# Patient Record
Sex: Female | Born: 1960 | Race: White | Hispanic: No | Marital: Married | Smoking: Never smoker
Health system: Southern US, Community
[De-identification: ages and names within clinical notes are randomized; demographics above are authoritative.]

## PROBLEM LIST (undated history)

## (undated) DIAGNOSIS — I442 Atrioventricular block, complete: Secondary | ICD-10-CM

## (undated) DIAGNOSIS — Z889 Allergy status to unspecified drugs, medicaments and biological substances status: Secondary | ICD-10-CM

## (undated) DIAGNOSIS — F29 Unspecified psychosis not due to a substance or known physiological condition: Secondary | ICD-10-CM

## (undated) DIAGNOSIS — F419 Anxiety disorder, unspecified: Secondary | ICD-10-CM

## (undated) HISTORY — DX: Allergy status to unspecified drugs, medicaments and biological substances: Z88.9

## (undated) HISTORY — DX: Atrioventricular block, complete: I44.2

## (undated) HISTORY — DX: Anxiety disorder, unspecified: F41.9

## (undated) HISTORY — DX: Unspecified psychosis not due to a substance or known physiological condition: F29

## (undated) HISTORY — PX: TUBAL LIGATION: SHX77

---

## 2010-12-20 ENCOUNTER — Ambulatory Visit (INDEPENDENT_AMBULATORY_CARE_PROVIDER_SITE_OTHER): Payer: BC Managed Care – PPO | Admitting: Psychiatry

## 2010-12-20 ENCOUNTER — Ambulatory Visit (HOSPITAL_COMMUNITY): Payer: Self-pay | Admitting: Psychiatry

## 2010-12-20 DIAGNOSIS — F3289 Other specified depressive episodes: Secondary | ICD-10-CM

## 2011-01-07 ENCOUNTER — Encounter (HOSPITAL_COMMUNITY): Payer: Self-pay | Admitting: Psychiatry

## 2011-01-10 ENCOUNTER — Ambulatory Visit (HOSPITAL_COMMUNITY): Payer: BC Managed Care – PPO | Admitting: Psychiatry

## 2011-01-10 ENCOUNTER — Ambulatory Visit (INDEPENDENT_AMBULATORY_CARE_PROVIDER_SITE_OTHER): Payer: BC Managed Care – PPO | Admitting: Psychiatry

## 2011-01-10 ENCOUNTER — Encounter (HOSPITAL_COMMUNITY): Payer: Self-pay | Admitting: Psychiatry

## 2011-01-10 VITALS — Ht 64.0 in | Wt 223.0 lb

## 2011-01-10 DIAGNOSIS — F29 Unspecified psychosis not due to a substance or known physiological condition: Secondary | ICD-10-CM

## 2011-01-10 MED ORDER — ARIPIPRAZOLE 2 MG PO TABS: 2.0000 mg | ORAL_TABLET | Freq: Every day | ORAL | Status: DC

## 2011-01-10 NOTE — Progress Notes (Signed)
   [facility] Follow-up Outpatient Visit  Alejandra Harrison [birth year]  Date:    Subjective: Returns with husband.  Still obsessed with videos and some irrational thinking.  Confusing talk about not remembering what she has done.   Some visual and auditory hallucinations (God told me I was a good girl).  Ht.64in  Wt.223lb  Mental Status Examination  Appearance: brighter, more appropriate, smiling, appropriate makeup Alert: Yes Attention: good  Cooperative: Yes Eye Contact: Good Speech: normal Psychomotor Activity: Normal Memory/Concentration: grossly intact Oriented: person, place, time/date and situation Mood: Euthymic Affect: Congruent Thought Processes and Associations: Linear Fund of Knowledge: Fair Thought Content: Seems more appropriate, but still not always making good sense to husband Insight: Fair Judgement: Fair  Diagnosis: Psychosis, etiology unclear, likely bipolar  Treatment Plan: Abilify increased to 2 mg/day To see therapist -- need to schedule next visit  Rossie Muskrat, MD

## 2011-01-31 ENCOUNTER — Encounter (HOSPITAL_COMMUNITY): Payer: Self-pay | Admitting: Psychiatry

## 2011-01-31 ENCOUNTER — Ambulatory Visit (INDEPENDENT_AMBULATORY_CARE_PROVIDER_SITE_OTHER): Payer: BC Managed Care – PPO | Admitting: Psychiatry

## 2011-01-31 DIAGNOSIS — F29 Unspecified psychosis not due to a substance or known physiological condition: Secondary | ICD-10-CM

## 2011-01-31 MED ORDER — ARIPIPRAZOLE 5 MG PO TABS: 5.0000 mg | ORAL_TABLET | Freq: Every day | ORAL | Status: DC

## 2011-01-31 NOTE — Progress Notes (Signed)
Called pharmacy

## 2011-01-31 NOTE — Progress Notes (Signed)
Patient ID: Alejandra Harrison, female   DOB: 03-29-1960, 49 y.o.   MRN: [id number] Patient with husband.  She still is obsessed with videos and isolation from family.  Instructed them to increase her Abilify to 5 mg. And observe response.

## 2011-02-24 ENCOUNTER — Telehealth (HOSPITAL_COMMUNITY): Payer: Self-pay | Admitting: *Deleted

## 2011-03-14 ENCOUNTER — Ambulatory Visit (HOSPITAL_COMMUNITY): Payer: BC Managed Care – PPO | Admitting: Psychiatry

## 2011-03-21 ENCOUNTER — Encounter (HOSPITAL_COMMUNITY): Payer: Self-pay | Admitting: Psychiatry

## 2011-03-21 ENCOUNTER — Ambulatory Visit (INDEPENDENT_AMBULATORY_CARE_PROVIDER_SITE_OTHER): Payer: BC Managed Care – PPO | Admitting: Psychiatry

## 2011-03-21 DIAGNOSIS — F29 Unspecified psychosis not due to a substance or known physiological condition: Secondary | ICD-10-CM

## 2011-03-21 MED ORDER — ARIPIPRAZOLE 5 MG PO TABS: 5.0000 mg | ORAL_TABLET | Freq: Every day | ORAL | Status: DC

## 2011-03-21 NOTE — Progress Notes (Signed)
Patient ID: Alejandra Harrison, female   DOB: [birth year], 51 y.o.   MRN: [id number] Patient reports less symptoms; not watching videos as much and fewer intrusive thoughts.  Less psychotic thinking.  Mood more stable lately.

## 2011-03-21 NOTE — Patient Instructions (Signed)
Continue medication as directed.

## 2011-05-09 ENCOUNTER — Ambulatory Visit (INDEPENDENT_AMBULATORY_CARE_PROVIDER_SITE_OTHER): Payer: BC Managed Care – PPO | Admitting: Psychiatry

## 2011-05-09 ENCOUNTER — Encounter (HOSPITAL_COMMUNITY): Payer: Self-pay | Admitting: Psychiatry

## 2011-05-09 DIAGNOSIS — F29 Unspecified psychosis not due to a substance or known physiological condition: Secondary | ICD-10-CM

## 2011-05-09 NOTE — Progress Notes (Signed)
Patient ID: Alejandra Harrison, female   DOB: December 06, 1960, 51 y.o.   MRN: [id number] No problems.  Thinking is mostly clear.  Mood stable and not fluctuating (not too high or too low).  Sleep is adequate.  Husband not complaining.  Eating well.

## 2011-08-29 ENCOUNTER — Ambulatory Visit (INDEPENDENT_AMBULATORY_CARE_PROVIDER_SITE_OTHER): Payer: BC Managed Care – PPO | Admitting: Psychiatry

## 2011-08-29 ENCOUNTER — Encounter (HOSPITAL_COMMUNITY): Payer: Self-pay | Admitting: Psychiatry

## 2011-08-29 DIAGNOSIS — F29 Unspecified psychosis not due to a substance or known physiological condition: Secondary | ICD-10-CM

## 2011-08-29 MED ORDER — ARIPIPRAZOLE 2 MG PO TABS: 2.0000 mg | ORAL_TABLET | Freq: Every day | ORAL | Status: DC

## 2011-08-29 NOTE — Progress Notes (Signed)
Patient ID: Alejandra Harrison, female   DOB: [birth year], 51 y.o.   MRN: [id number] Lost weight and had a tremor so decreased med by half.  Sleeping better on 2.5 Abilify.  But only ~4hr. Could not eat well leading to wt. Loss of 41 lb.  MD said she was ok.  Appetite better.  Feel unmotivated to do anything now.  Not feeling sad or unhappy.  Husband agrees with her worries.  Has akathesia with restless feet.  No paranoid thinking.

## 2011-09-26 ENCOUNTER — Encounter (HOSPITAL_COMMUNITY): Payer: Self-pay | Admitting: Psychiatry

## 2011-09-26 ENCOUNTER — Ambulatory Visit (INDEPENDENT_AMBULATORY_CARE_PROVIDER_SITE_OTHER): Payer: BC Managed Care – PPO | Admitting: Psychiatry

## 2011-09-26 DIAGNOSIS — F29 Unspecified psychosis not due to a substance or known physiological condition: Secondary | ICD-10-CM

## 2011-09-26 NOTE — Progress Notes (Signed)
Patient ID: Alejandra Harrison, female   DOB: 05-18-1960, 51 y.o.   MRN: [id number] Seems better with decrease Abilify to 1mg /day.  Tremor decreased.  Thinking is ok.  Husband not worried now.  No obsessive thoughts or worries.  Sleep ok.

## 2011-12-19 ENCOUNTER — Ambulatory Visit (INDEPENDENT_AMBULATORY_CARE_PROVIDER_SITE_OTHER): Payer: BC Managed Care – PPO | Admitting: Psychiatry

## 2011-12-19 ENCOUNTER — Encounter (HOSPITAL_COMMUNITY): Payer: Self-pay | Admitting: Psychiatry

## 2011-12-19 DIAGNOSIS — F29 Unspecified psychosis not due to a substance or known physiological condition: Secondary | ICD-10-CM

## 2011-12-19 MED ORDER — ARIPIPRAZOLE 2 MG PO TABS: 2.0000 mg | ORAL_TABLET | Freq: Every day | ORAL | Status: DC

## 2011-12-19 NOTE — Progress Notes (Deleted)
[facility] MD Progress Note  Diagnosis:  {Axis Diagnosis:3049000}{OT ALL NOTES:[id number]}298.90 Psychosis  ADL's:  {BHH ADL'S:22290}xxxxx  Sleep: {[facility] GOOD/FAIR/POOR:22877}good    Appetite:  {B[facility] GOOD/FAIR/POOR:22877}good  Suicidal Ideation:  {[facility] IDEATION:22878}noneHomicidal Ideation: none {BHH IDEATION:22878}  AEB (as evidenced by):  Mental Status Examination/Evaluation: Objective:  Appearance: {Appearance:22683}appropriate  Eye Contact::  {[facility] EYE CONTACT:22684}tgood  Speech:  {Speech:22685}appropriate  Volume:  {Volume (PAA):22686}  Mood:  {BHH MOOD:22306}stable  Affect:  {Affect (PAA):22687}neutral  Thought Process:  {Thought Process (PAA):22688}intact  Orientation:  {BHH ORIENTATION (PAA):22689}in all spheres  Thought Content:  {Thought Content:22690}approp  Suicidal Thoughts:  {ST/HT (PAA):22692}none  Homicidal Thoughts:  {ST/HT (PAA):22692}none  Memory:  {B[facility] MEMORY:22881}intact  Judgement:  {Judgement (PAA):22694}good  Insight:  {Insight (PAA):22695}fair  Psychomotor Activity:  {Psychomotor (PAA):22696}approp  Concentration:  {BHH GOOD/FAIR/POOR:22877}good  Recall:  {BHH GOOD/FAIR/POOR:22877}good  Akathisia:  {BHH YES OR NO:22294}no  Handed:  {Handed:22697}right  AIMS (if indicated):     Assets:  {Assets (PAA):22698}intact family  Sleep:   good   Current Medications:   Physical Findings: AIMS:  , ,  ,  ,   none CIWA:   none  COWS:   none  Treatment Plan Summary: {[facility] MD TX. PLAN:22744}Patient functioning better with 1mg . Daily Abilify.  Not obsessing and feels well.  Health ok.  Sleeping ok.  Drives bus early.    Plan:continue med above

## 2011-12-19 NOTE — Progress Notes (Signed)
Patient ID: Alejandra Harrison, female   DOB: 04-21-1960, 51 y.o.   MRN: [id number] Patient reports doing well on 1 mg. Daily Abilify.  No anxiety or obsessions.  Sleep ok.  Functioning well in work and at home.  No delusional thinking.

## 2012-04-02 ENCOUNTER — Ambulatory Visit (INDEPENDENT_AMBULATORY_CARE_PROVIDER_SITE_OTHER): Payer: BC Managed Care – PPO | Admitting: Psychiatry

## 2012-04-02 ENCOUNTER — Encounter (HOSPITAL_COMMUNITY): Payer: Self-pay | Admitting: Psychiatry

## 2012-04-02 VITALS — Ht 65.0 in | Wt 213.4 lb

## 2012-04-02 DIAGNOSIS — F29 Unspecified psychosis not due to a substance or known physiological condition: Secondary | ICD-10-CM

## 2012-04-02 DIAGNOSIS — F5105 Insomnia due to other mental disorder: Secondary | ICD-10-CM | POA: Insufficient documentation

## 2012-04-02 MED ORDER — ARIPIPRAZOLE 2 MG PO TABS: 1.0000 mg | ORAL_TABLET | Freq: Every day | ORAL | Status: DC

## 2012-04-02 NOTE — Progress Notes (Signed)
[facility] [address] Progress Note MACARENA LANGSETH MRN: [id number] DOB: 04/11/1960 Age: 52 y.o.  Date: 04/02/2012 Start Time: 10:30 AM End Time: 10:50 AM  Chief Complaint: Chief Complaint  Patient presents with  . Follow-up  . Medication Refill   Subjective:   "I'm doing pretty well". Depression 0/10 and Anxiety 0/10, where 1 is the best and 10 is the worst.   Pt returns for follow-up.  Pt reports that she is compliant with the psychotropic medications with great benefit and no noticeable side effects.  She has noted one dream and that could be a side effect.  In detail she describes sleep paralysis, but that was one event of that.  Diagnosis:   Axis I: Psychotic Disorder NOS Axis II: Deferred Axis III:  Past Medical History  Diagnosis Date  . Anxiety   . Phlebitis   . History of seasonal allergies   . Psychosis    Axis IV: other psychosocial or environmental problems Axis V: 51-60 moderate symptoms  ADL's:  Intact  Sleep: Good  Appetite:  Good  Suicidal Ideation:  Pt denies any thoughts, plans, intent of suicide Homicidal Ideation:  Pt denies any thoughts, plans, intent of homicide  AEB (as evidenced by):per pt report  Psychiatric Specialty Exam: Review of Systems  Musculoskeletal: Positive for back pain.  All other systems reviewed and are negative.    Height 5\' 5"  (1.651 m), weight 213 lb 6.4 oz (96.798 kg).Body mass index is 35.51 kg/(m^2).  General Appearance: Casual  Eye Contact::  Good  Speech:  Clear and Coherent  Volume:  Normal  Mood:  Euthymic  Affect:  Congruent  Thought Process:  Coherent, Linear and Logical  Orientation:  Full (Time, Place, and Person)  Thought Content:  WDL  Suicidal Thoughts:  No  Homicidal Thoughts:  No  Memory:  Immediate;   Good Recent;   Good Remote;   Good  Judgement:  Good  Insight:  Good  Psychomotor Activity:  Normal  Concentration:  Good  Recall:  Good  Akathisia:  No  Handed:  Right  AIMS (if  indicated):     Assets:  Communication Skills Desire for Improvement  Sleep:      Current Medications: Current Outpatient Prescriptions  Medication Sig Dispense Refill  . ARIPiprazole (ABILIFY) 2 MG tablet Take 1 tablet (2 mg total) by mouth daily.  30 tablet  12  . doxycycline (VIBRA-TABS) 100 MG tablet        Lab Results: No results found for this or any previous visit (from the past 8736 hour(s)). Family doctor did blood work last year and it all came back good.  Physical Findings: AIMS:  , ,  ,  ,    CIWA:    COWS:     Treatment Plan Summary: Medication management  Plan: I took her vitals.  I reviewed CC, tobacco/med/surg Hx, meds effects/ side effects, problem list, therapies and responses as well as current situation/symptoms discussed options. See orders and pt instructions for more details.  Medical Decision Making Problem Points:  Established problem, stable/improving (1), Review of last therapy session (1) and Review of psycho-social stressors (1) Data Points:  Review or order clinical lab tests (1) Review of medication regiment & side effects (2)  I certify that inpatient services furnished can reasonably be expected to improve the patient's condition.   Orson Aloe, MD, [facility]

## 2012-04-02 NOTE — Patient Instructions (Signed)
Paraphrenia of late life could make some sense of what is going on with you.  Look that up on the Internet.  Yoga is a very helpful exercise method.  On TV or on line [employer]ai[employer] is a source of high quality information about yoga and videos on yoga.  Alejandra Harrison is the world's number one video yoga instructor according to some experts.  There are exceptional health benefits that can be achieved through yoga.  The main principles of yoga is acceptance, no competition, no comparison, and no judgement.  It is exceptional in helping people meditate and get to a very relaxed state.   Call if problems or concerns.

## 2012-04-09 ENCOUNTER — Ambulatory Visit (HOSPITAL_COMMUNITY): Payer: Self-pay | Admitting: Psychiatry

## 2012-04-10 ENCOUNTER — Other Ambulatory Visit: Payer: Self-pay

## 2012-06-25 ENCOUNTER — Ambulatory Visit (INDEPENDENT_AMBULATORY_CARE_PROVIDER_SITE_OTHER): Payer: BC Managed Care – PPO | Admitting: Psychiatry

## 2012-06-25 ENCOUNTER — Encounter (HOSPITAL_COMMUNITY): Payer: Self-pay | Admitting: Psychiatry

## 2012-06-25 ENCOUNTER — Ambulatory Visit (HOSPITAL_COMMUNITY): Payer: Self-pay | Admitting: Psychiatry

## 2012-06-25 VITALS — BP 128/92 | HR 69 | Ht 64.5 in | Wt 220.0 lb

## 2012-06-25 DIAGNOSIS — F5105 Insomnia due to other mental disorder: Secondary | ICD-10-CM

## 2012-06-25 DIAGNOSIS — F29 Unspecified psychosis not due to a substance or known physiological condition: Secondary | ICD-10-CM

## 2012-06-25 MED ORDER — ARIPIPRAZOLE 2 MG PO TABS: 1.0000 mg | ORAL_TABLET | Freq: Every day | ORAL | Status: DC

## 2012-06-25 NOTE — Progress Notes (Signed)
[facility] [address] Progress Note Alejandra Harrison MRN: [id number] DOB: 03/25/1960 Age: 52 y.o.  Date: 06/25/2012 Start Time: 10:40 AM End Time: 10:55 AM  Chief Complaint: Chief Complaint  Patient presents with  . Other  . Follow-up  . Medication Refill   Subjective:   "I'm doing pretty well". Depression 1/10 and Anxiety 2/10, where 1 is the best and 10 is the worst. Pain is 0/10.  Pt returns for follow-up.  Pt reports that she is compliant with the psychotropic medications with good benefit and no noticeable side effects.  She had one episode of sleep paralysis the time before last, none this time.  She looked up paraphernia on the Internet and she feels that that describes her symptom picture.  Occasionally she takes a whole tab at night.  Will prescribe to cover that.  Allergies: Allergies  Allergen Reactions  . Oxycodone-Acetaminophen   . Penicillins    Medical History: Past Medical History  Diagnosis Date  . Anxiety   . Phlebitis   . History of seasonal allergies   . Psychosis    Surgical History: Past Surgical History  Procedure Laterality Date  . Tubal ligation     Diagnosis:   Axis I: Psychotic Disorder NOS Axis II: Deferred Axis III:  Past Medical History  Diagnosis Date  . Anxiety   . Phlebitis   . History of seasonal allergies   . Psychosis    Axis IV: other psychosocial or environmental problems Axis V: 51-60 moderate symptoms  ADL's:  Intact  Sleep: Good  Appetite:  Good  Suicidal Ideation:  Pt denies any thoughts, plans, intent of suicide Homicidal Ideation:  Pt denies any thoughts, plans, intent of homicide  AEB (as evidenced by):per pt report  Psychiatric Specialty Exam: Review of Systems  Musculoskeletal: Positive for back pain.  All other systems reviewed and are negative.    Blood pressure 128/92, pulse 69, height 5' 4.5" (1.638 m), weight 220 lb (99.791 kg).Body mass index is 37.19 kg/(m^2).  General Appearance: Casual   Eye Contact::  Good  Speech:  Clear and Coherent  Volume:  Normal  Mood:  Euthymic  Affect:  Congruent  Thought Process:  Coherent, Linear and Logical  Orientation:  Full (Time, Place, and Person)  Thought Content:  WDL  Suicidal Thoughts:  No  Homicidal Thoughts:  No  Memory:  Immediate;   Good Recent;   Good Remote;   Good  Judgement:  Good  Insight:  Good  Psychomotor Activity:  Normal  Concentration:  Good  Recall:  Good  Akathisia:  No  Handed:  Right  AIMS (if indicated):     Assets:  Communication Skills Desire for Improvement  Sleep:      Current Medications: Current Outpatient Prescriptions  Medication Sig Dispense Refill  . ARIPiprazole (ABILIFY) 2 MG tablet Take 0.5 tablets (1 mg total) by mouth daily.  15 tablet  2  . doxycycline (VIBRA-TABS) 100 MG tablet        No current facility-administered medications for this visit.   Lab Results: No results found for this or any previous visit (from the past 8736 hour(s)). Family doctor did blood work last year and it all came back good.  Physical Findings: AIMS:  , ,  ,  ,    CIWA:    COWS:     Treatment Plan Summary: Medication management  Plan: I took her vitals.  I reviewed CC, tobacco/med/surg Hx, meds effects/ side effects, problem list, therapies and  responses as well as current situation/symptoms discussed options. Continue current effective medications. See orders and pt instructions for more details.  MEDICATIONS this encounter: Meds ordered this encounter  Medications  . ARIPiprazole (ABILIFY) 2 MG tablet    Sig: Take 0.5-1 tablets (1-2 mg total) by mouth daily.    Dispense:  30 tablet    Refill:  2   Medical Decision Making Problem Points:  Established problem, stable/improving (1), Review of last therapy session (1) and Review of psycho-social stressors (1) Data Points:  Review or order clinical lab tests (1) Review of medication regiment & side effects (2)  I certify that inpatient  services furnished can reasonably be expected to improve the patient's condition.   Orson Aloe, MD, [facility]

## 2012-06-25 NOTE — Patient Instructions (Signed)
Set a timer for 8 or a certain number minutes and walk for that amount of time in the house or in the yard.  Mark the number of minutes on a calendar for that day.  Do that every day this week.  Then next week increase the time by 1 minutes and then mark the calendar with the number of minutes for that day.  Each week increase your exercise by one minute.  Keep a record of this so you can see the progress you are making.  Do this every day, just like eating and sleeping.  It is good for pain control, depression, and for your soul/spirit.  Bring the record in for your next visit so we can talk about your effort and how you feel with the new exercise program going and working for you.  Take care of yourself.  No one else is standing up to do the job and only you know what you need.   GET SERIOUS about taking care of yourself.  Do the next right thing and that often means doing something to care for yourself along the lines of are you hungry, are you angry, are you lonely, are you tired, are you scared?  [employer] is what that stands for.  Call if problems or concerns. 

## 2012-09-23 ENCOUNTER — Encounter (HOSPITAL_COMMUNITY): Payer: Self-pay | Admitting: Psychiatry

## 2012-09-23 ENCOUNTER — Ambulatory Visit (INDEPENDENT_AMBULATORY_CARE_PROVIDER_SITE_OTHER): Payer: BC Managed Care – PPO | Admitting: Psychiatry

## 2012-09-23 VITALS — Wt 223.0 lb

## 2012-09-23 DIAGNOSIS — F5105 Insomnia due to other mental disorder: Secondary | ICD-10-CM

## 2012-09-23 DIAGNOSIS — F29 Unspecified psychosis not due to a substance or known physiological condition: Secondary | ICD-10-CM

## 2012-09-23 MED ORDER — ARIPIPRAZOLE 2 MG PO TABS: ORAL_TABLET | ORAL | Status: DC

## 2012-09-23 NOTE — Progress Notes (Signed)
[facility] [address] Progress Note Alejandra Harrison MRN: [id number] DOB: Aug 06, 1960 Age: 52 y.o.  Date: 09/23/2012  Chief Complaint  Patient presents with  . Follow-up  . Medication Refill   History of present illness. Sugars 52 year old Caucasian married female who came for her followup appointment.  [name] taking Abilify 1 mg at bedtime.  [name] very scared to take more than 1 mg.  She feels much better with low dose Abilify.  Since taking Abilify she has not seen any unusual things.  However she is very scared to stop her psychotropic medication.  She sleeping better.  She denies any irritability anger or mood swings.  She denies any tremors or shakes.  She wants to continue low dose Abilify.  [name] not drinking or using any illegal substance.  Past psychiatric history. Patient started to have visual hallucination in 2012.  She was seen by Dr. Toni Arthurs and given Abilify.  Since then she's been taking the same medication without any side effects.  She denies any history of suicidal attempt or any inpatient psychiatric treatment.  Allergies: Allergies  Allergen Reactions  . Oxycodone-Acetaminophen   . Penicillins    Medical History: Past Medical History  Diagnosis Date  . Anxiety   . Phlebitis   . History of seasonal allergies   . Psychosis    Surgical History: Past Surgical History  Procedure Laterality Date  . Tubal ligation       Review of Systems  Constitutional: Positive for weight loss.  Musculoskeletal: Positive for back pain.  Psychiatric/Behavioral: Positive for depression. The patient is nervous/anxious and has insomnia.   All other systems reviewed and are negative.       Current Medications: Current Outpatient Prescriptions  Medication Sig Dispense Refill  . ARIPiprazole (ABILIFY) 2 MG tablet Take 1/2 tab at bed time  15 tablet  1   No current facility-administered medications for this visit.   Lab Results: No results found for this or any previous  visit (from the past 8736 hour(s)). Family doctor did blood work last year and it all came back good.  Mental status examination Patient is obese female who is casually dressed and fairly groomed.  She appears to be his stated age.  Her speech is clear and coherent.  Her thought process is slow but logical.  She denies any active or passive suicidal thoughts homicidal thought.  At this time she denies any visual or auditory hallucination.  She described her mood as anxious and her affect is mood appropriate.  There were no flight if ideas or any loose association.  Her attention concentration is fair.  [name] oriented x3.  There were no tremors or shakes.  Her insight judgment and impulse control is good.  Diagnosis:   Axis I: Psychotic Disorder NOS Axis II: Deferred Axis III:  Past Medical History  Diagnosis Date  . Anxiety   . Phlebitis   . History of seasonal allergies   . Psychosis    Axis IV: other psychosocial or environmental problems Axis V: 51-60 moderate symptoms  Plan: I will continue Abilify 1 mg at bedtime.  Patient does not want to try higher dose.  [name] much comfortable with the current dose.  She does not have any tremors or shakes.  Recommend to call us back if she has any question or concern.  Followup in 3 months.  MEDICATIONS this encounter: Meds ordered this encounter  Medications  . ARIPiprazole (ABILIFY) 2 MG tablet  Sig: Take 1/2 tab at bed time    Dispense:  15 tablet    Refill:  1   Medical Decision Making Problem Points:  Established problem, stable/improving (1), Review of last therapy session (1) and Review of psycho-social stressors (1) Data Points:  Review or order clinical lab tests (1) Review of medication regiment & side effects (2)   ARFEEN,SYED T., MD

## 2012-09-24 ENCOUNTER — Ambulatory Visit (HOSPITAL_COMMUNITY): Payer: Self-pay | Admitting: Psychiatry

## 2012-12-24 ENCOUNTER — Ambulatory Visit (HOSPITAL_COMMUNITY): Payer: Self-pay | Admitting: Psychiatry

## 2012-12-28 ENCOUNTER — Ambulatory Visit (INDEPENDENT_AMBULATORY_CARE_PROVIDER_SITE_OTHER): Payer: BC Managed Care – PPO | Admitting: Psychiatry

## 2012-12-28 ENCOUNTER — Encounter (HOSPITAL_COMMUNITY): Payer: Self-pay | Admitting: Psychiatry

## 2012-12-28 VITALS — BP 120/90 | Ht 65.0 in | Wt 215.0 lb

## 2012-12-28 DIAGNOSIS — F5105 Insomnia due to other mental disorder: Secondary | ICD-10-CM

## 2012-12-28 DIAGNOSIS — F29 Unspecified psychosis not due to a substance or known physiological condition: Secondary | ICD-10-CM

## 2012-12-28 MED ORDER — ARIPIPRAZOLE 2 MG PO TABS: ORAL_TABLET | ORAL | Status: DC

## 2012-12-28 NOTE — Progress Notes (Signed)
Patient ID: Alejandra Harrison, female   DOB: [birth year], 52 y.o.   MRN: [id number] [facility] [address] Progress Note ESBEYDI MANAGO MRN: [id number] DOB: [birth year] Age: 52 y.o.  Date: 12/28/2012  Chief Complaint  Patient presents with  . Hallucinations  . Follow-up   History of present illness. As patient is a 52 year old married white female who lives with her husband and 2 sons ages 47 and 51 in 75. 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  The patient states that in 2012 she started seeing a figure that she thought may have been God. She also got fixated on a man in her Sunday school and thought that her husband would die and she would have to marry this man. She was having lots of religious visions. She claims that she had not been on any medication or using drugs or alcohol at that time. She had not had a head injury or any additional stress. She may of been going through menopause. She denied visual hallucinations but claims she felt "high and on top of the world." She's never had a history of depression however.  At any rate she was seen here by Dr. Toni Arthurs and started on Abilify. She was on a higher dose such as 5 mg in the past and it caused akathisia. Currently she takes 1 mg per day and it seems to be working fairly well. She's no longer having visions or unusual thoughts and she's functioning well in her job. She's not having significant side effects Past psychiatric history. Patient started to have visual hallucination in 2012.  She was seen by Dr. Toni Arthurs and given Abilify.  Since then she's been taking the same medication without any side effects.  She denies any history of suicidal attempt or any inpatient psychiatric treatment.  Allergies: Allergies  Allergen Reactions  . Oxycodone-Acetaminophen   . Penicillins    Medical History: Past Medical History  Diagnosis Date  . Anxiety   . Phlebitis   . History of seasonal allergies   . Psychosis     Surgical History: Past Surgical History  Procedure Laterality Date  . Tubal ligation       Review of Systems  Constitutional: Positive for weight loss.  Musculoskeletal: Positive for back pain.  Psychiatric/Behavioral: Positive for depression. The patient is nervous/anxious and has insomnia.   All other systems reviewed and are negative.       Current Medications: Current Outpatient Prescriptions  Medication Sig Dispense Refill  . ARIPiprazole (ABILIFY) 2 MG tablet Take 1/2 tab at bed time  15 tablet  2  . ARIPiprazole (ABILIFY) 2 MG tablet Take 1/2 tab at bed time  45 tablet  1   No current facility-administered medications for this visit.   Lab Results: No results found for this or any previous visit (from the past 8736 hour(s)). Family doctor did blood work last year and it all came back good.  Mental status examination Patient is obese female who is casually dressed and fairly groomed.  She appears to be his stated age.  Her speech is clear and coherent.  Her thought process is  logical.  She denies any active or passive suicidal thoughts homicidal thought.  At this time she denies any visual or auditory hallucination.  She described her mood as anxious and her affect is mood appropriate.  There were no flight if ideas or any loose association.  Her attention concentration is fair.  [name]  oriented x3.  There were no tremors or shakes.  Her insight judgment and impulse control is good.  Diagnosis:   Axis I: Psychotic Disorder NOS Axis II: Deferred Axis III:  Past Medical History  Diagnosis Date  . Anxiety   . Phlebitis   . History of seasonal allergies   . Psychosis    Axis IV: other psychosocial or environmental problems Axis V: 51-60 moderate symptoms  Plan: I will continue Abilify 1 mg at bedtime.  Patient does not want to try higher dose.  [name] much comfortable with the current dose.  She does not have any tremors or shakes.  Recommend to call us back if she  has any question or concern.  Followup in 3 months.  MEDICATIONS this encounter: Meds ordered this encounter  Medications  . ARIPiprazole (ABILIFY) 2 MG tablet    Sig: Take 1/2 tab at bed time    Dispense:  15 tablet    Refill:  2  . ARIPiprazole (ABILIFY) 2 MG tablet    Sig: Take 1/2 tab at bed time    Dispense:  45 tablet    Refill:  1   Medical Decision Making Problem Points:  Established problem, stable/improving (1), Review of last therapy session (1) and Review of psycho-social stressors (1) Data Points:  Review or order clinical lab tests (1) Review of medication regiment & side effects (2)   Harrison, DEBORAH, MD

## 2013-02-22 ENCOUNTER — Ambulatory Visit (INDEPENDENT_AMBULATORY_CARE_PROVIDER_SITE_OTHER): Payer: BC Managed Care – PPO | Admitting: General Practice

## 2013-02-22 ENCOUNTER — Telehealth: Payer: Self-pay | Admitting: Nurse Practitioner

## 2013-02-22 ENCOUNTER — Encounter: Payer: Self-pay | Admitting: General Practice

## 2013-02-22 VITALS — BP 133/87 | HR 95 | Temp 98.6°F | Wt 215.0 lb

## 2013-02-22 DIAGNOSIS — J029 Acute pharyngitis, unspecified: Secondary | ICD-10-CM

## 2013-02-22 DIAGNOSIS — J069 Acute upper respiratory infection, unspecified: Secondary | ICD-10-CM

## 2013-02-22 LAB — POCT RAPID STREP A (OFFICE): Rapid Strep A Screen: NEGATIVE

## 2013-02-22 MED ORDER — AZITHROMYCIN 250 MG PO TABS: ORAL_TABLET | ORAL | Status: DC

## 2013-02-22 NOTE — Patient Instructions (Signed)

## 2013-02-22 NOTE — Telephone Encounter (Signed)
appt today at 4:20

## 2013-02-22 NOTE — Progress Notes (Signed)
   Subjective:    Patient ID: YURIDIANA FORMANEK, female    DOB: [birth year], 52 y.o.   MRN: [id number]  Sore Throat  This is a new problem. The current episode started in the past 7 days. The problem has been unchanged. Neither side of throat is experiencing more pain than the other. There has been no fever. The pain is at a severity of 3/10. Associated symptoms include congestion and coughing. Pertinent negatives include no headaches or shortness of breath. She has tried nothing for the symptoms.      Review of Systems  Constitutional: Negative for fever and chills.  HENT: Positive for congestion and sore throat. Negative for postnasal drip and sinus pressure.   Respiratory: Positive for cough. Negative for chest tightness and shortness of breath.   Cardiovascular: Negative for chest pain and palpitations.  Neurological: Negative for headaches.       Objective:   Physical Exam  Constitutional: [name] oriented to person, place, and time. She appears well-developed and well-nourished.  HENT:  Head: Normocephalic and atraumatic.  Right Ear: External ear normal.  Left Ear: External ear normal.  Cardiovascular: Normal rate, regular rhythm and normal heart sounds.   Pulmonary/Chest: Effort normal and breath sounds normal. No respiratory distress. She exhibits no tenderness.  Neurological: [name] alert and oriented to person, place, and time.  Skin: Skin is warm and dry.  Psychiatric: She has a normal mood and affect.      Results for orders placed in visit on 02/22/13  POCT RAPID STREP A (OFFICE)      Result Value Range   Rapid Strep A Screen Negative  Negative       Assessment & Plan:  1. Acute pharyngitis  - POCT rapid strep A  2. Upper respiratory infection  - azithromycin (ZITHROMAX) 250 MG tablet; Take as directed  Dispense: 6 tablet; Refill: 0 -increase fluids -adequate fluids -RTO if symptoms worsen or unresolved Patient verbalized understanding Coralie Keens,  FNP-C

## 2013-03-15 ENCOUNTER — Ambulatory Visit (INDEPENDENT_AMBULATORY_CARE_PROVIDER_SITE_OTHER): Payer: BC Managed Care – PPO | Admitting: Psychiatry

## 2013-03-15 ENCOUNTER — Encounter (HOSPITAL_COMMUNITY): Payer: Self-pay | Admitting: Psychiatry

## 2013-03-15 VITALS — BP 110/84 | Ht 65.0 in | Wt 215.0 lb

## 2013-03-15 DIAGNOSIS — F5105 Insomnia due to other mental disorder: Secondary | ICD-10-CM

## 2013-03-15 DIAGNOSIS — F489 Nonpsychotic mental disorder, unspecified: Secondary | ICD-10-CM

## 2013-03-15 DIAGNOSIS — F29 Unspecified psychosis not due to a substance or known physiological condition: Secondary | ICD-10-CM

## 2013-03-15 MED ORDER — ARIPIPRAZOLE 2 MG PO TABS: ORAL_TABLET | ORAL | Status: DC

## 2013-03-15 NOTE — Progress Notes (Signed)
Patient ID: Alejandra Harrison, female   DOB: 07/27/1960, 53 y.o.   MRN: [id number] Patient ID: Alejandra Harrison, female   DOB: 11/15/1960, 53 y.o.   MRN: [id number] [facility] [id number] Progress Note Alejandra Harrison MRN: [id number] DOB: 09/11/1960 Age: 53 y.o.  Date: 03/15/2013  Chief Complaint  Patient presents with  . Hallucinations  . Anxiety  . Follow-up   History of present illness. As patient is a 53 year old married white female who lives with her husband and 2 sons ages 6125 and 3120 in [place]. 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  The patient states that in 2012 she started seeing a figure that she thought may have been God. She also got fixated on a man in her Sunday school and thought that her husband would die and she would have to marry this man. She was having lots of religious visions. She claims that she had not been on any medication or using drugs or alcohol at that time. She had not had a head injury or any additional stress. She may of been going through menopause. She denied visual hallucinations but claims she felt "high and on top of the world." She's never had a history of depression however.  At any rate she was seen here by Dr. Toni ArthursFuller and started on Abilify. She was on a higher dose such as 5 mg in the past and it caused akathisia. Currently she takes 1 mg per day and it seems to be working fairly well. She's no longer having visions or unusual thoughts and she's functioning well in her job. She's not having significant side effects  The patient returns after 3 months. She continues to do very well. Her father-in-law died in November which was stressful. However she continues to work and her mood is been stable and she's had no hallucinations. Past psychiatric history. Patient started to have visual hallucination in 2012.  She was seen by Dr. Toni ArthursFuller and given Abilify.  Since then she's been taking the same medication without any side effects.  She denies  any history of suicidal attempt or any inpatient psychiatric treatment.  Allergies: Allergies  Allergen Reactions  . Oxycodone-Acetaminophen   . Penicillins    Medical History: Past Medical History  Diagnosis Date  . Anxiety   . Phlebitis   . History of seasonal allergies   . Psychosis    Surgical History: Past Surgical History  Procedure Laterality Date  . Tubal ligation       Review of Systems  Constitutional: Positive for weight loss.  Musculoskeletal: Positive for back pain.  Psychiatric/Behavioral: Positive for depression. The patient is nervous/anxious and has insomnia.   All other systems reviewed and are negative.       Current Medications: Current Outpatient Prescriptions  Medication Sig Dispense Refill  . ARIPiprazole (ABILIFY) 2 MG tablet Take 1/2 tab at bed time  30 tablet  3   No current facility-administered medications for this visit.   Lab Results:  Results for orders placed in visit on 02/22/13 (from the past 8736 hour(s))  POCT RAPID STREP A (OFFICE)   Collection Time    02/22/13  5:06 PM      Result Value Range   Rapid Strep A Screen Negative  Negative   Family doctor did blood work last year and it all came back good.  Mental status examination Patient is obese female who is casually dressed and fairly groomed.  She appears  to be her stated age.  Her speech is clear and coherent.  Her thought process is  logical.  She denies any active or passive suicidal thoughts homicidal thought.  At this time she denies any visual or auditory hallucination.  She described her mood as anxious and her affect is mood appropriate.  There were no flight if ideas or any loose association.  Her attention concentration is fair.  [name] oriented x3.  There were no tremors or shakes.  Her insight judgment and impulse control is good.  Diagnosis:   Axis I: Psychotic Disorder NOS Axis II: Deferred Axis III:  Past Medical History  Diagnosis Date  . Anxiety   .  Phlebitis   . History of seasonal allergies   . Psychosis    Axis IV: other psychosocial or environmental problems Axis V: 51-60 moderate symptoms  Plan: I will continue Abilify 1 mg at bedtime.  Patient does not want to try higher dose.  [name] much comfortable with the current dose.  She does not have any tremors or shakes.  Recommend to call us back if she has any question or concern.  Followup in 4 months.  MEDICATIONS this encounter: Meds ordered this encounter  Medications  . ARIPiprazole (ABILIFY) 2 MG tablet    Sig: Take 1/2 tab at bed time    Dispense:  30 tablet    Refill:  3   Medical Decision Making Problem Points:  Established problem, stable/improving (1), Review of last therapy session (1) and Review of psycho-social stressors (1) Data Points:  Review or order clinical lab tests (1) Review of medication regiment & side effects (2)   ROSS, DEBORAH, MD

## 2013-06-20 ENCOUNTER — Ambulatory Visit (HOSPITAL_COMMUNITY): Payer: Self-pay | Admitting: Psychiatry

## 2013-07-05 ENCOUNTER — Ambulatory Visit (INDEPENDENT_AMBULATORY_CARE_PROVIDER_SITE_OTHER): Payer: BC Managed Care – PPO | Admitting: Psychiatry

## 2013-07-05 ENCOUNTER — Encounter (HOSPITAL_COMMUNITY): Payer: Self-pay | Admitting: Psychiatry

## 2013-07-05 VITALS — BP 120/80 | Ht 65.0 in | Wt 216.0 lb

## 2013-07-05 DIAGNOSIS — F29 Unspecified psychosis not due to a substance or known physiological condition: Secondary | ICD-10-CM

## 2013-07-05 DIAGNOSIS — F5105 Insomnia due to other mental disorder: Secondary | ICD-10-CM

## 2013-07-05 MED ORDER — ARIPIPRAZOLE 2 MG PO TABS: ORAL_TABLET | ORAL | Status: DC

## 2013-07-05 NOTE — Progress Notes (Signed)
Patient ID: Alejandra ShuKaren G Klugh, female   DOB: 11/11/1960, 53 y.o.   MRN: [id number] Patient ID: Alejandra Harrison, female   DOB: 02/28/1960, 53 y.o.   MRN: [id number] Patient ID: Alejandra Harrison, female   DOB: 07/19/1960, 53 y.o.   MRN: [id number] [facility] [id number] Progress Note Alejandra Harrison MRN: [id number] DOB: 12/27/1960 Age: 53 y.o.  Date: 07/05/2013  Chief Complaint  Patient presents with  . Anxiety  . Hallucinations  . Follow-up   History of present illness. As patient is a 53 year old married white female who lives with her husband and 2 sons ages 6625 and 3120 in [place]. 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  The patient states that in 2012 she started seeing a figure that she thought may have been God. She also got fixated on a man in her Sunday school and thought that her husband would die and she would have to marry this man. She was having lots of religious visions. She claims that she had not been on any medication or using drugs or alcohol at that time. She had not had a head injury or any additional stress. She may of been going through menopause. She denied visual hallucinations but claims she felt "high and on top of the world." She's never had a history of depression however.  At any rate she was seen here by Dr. Toni ArthursFuller and started on Abilify. She was on a higher dose such as 5 mg in the past and it caused akathisia. Currently she takes 1 mg per day and it seems to be working fairly well. She's no longer having visions or unusual thoughts and she's functioning well in her job. She's not having significant side effects  The patient returns after 3 months. [name] worried today because her husband has been hospitalized with congestive heart failure today yesterday. She's not had any further hallucinations or delusions. Her mood is been good and she has been working. Patient started to have visual hallucination in 2012.  She was seen by Dr. Toni ArthursFuller and given Abilify.   Since then she's been taking the same medication without any side effects.  She denies any history of suicidal attempt or any inpatient psychiatric treatment.  Allergies: Allergies  Allergen Reactions  . Oxycodone-Acetaminophen   . Penicillins    Medical History: Past Medical History  Diagnosis Date  . Anxiety   . Phlebitis   . History of seasonal allergies   . Psychosis    Surgical History: Past Surgical History  Procedure Laterality Date  . Tubal ligation       Review of Systems  Constitutional: Positive for weight loss.  Musculoskeletal: Positive for back pain.  Psychiatric/Behavioral: Positive for depression. The patient is nervous/anxious and has insomnia.   All other systems reviewed and are negative.      Current Medications: Current Outpatient Prescriptions  Medication Sig Dispense Refill  . ARIPiprazole (ABILIFY) 2 MG tablet Take 1/2 tab at bed time  30 tablet  3   No current facility-administered medications for this visit.   Lab Results:  Results for orders placed in visit on 02/22/13 (from the past 8736 hour(s))  POCT RAPID STREP A (OFFICE)   Collection Time    02/22/13  5:06 PM      Result Value Ref Range   Rapid Strep A Screen Negative  Negative   Family doctor did blood work last year and it all came back good.  Mental status examination Patient is obese female who is casually dressed and fairly groomed.  She appears to be her stated age.  Her speech is clear and coherent.  Her thought process is  logical.  She denies any active or passive suicidal thoughts homicidal thought.  At this time she denies any visual or auditory hallucination.  She described her mood as anxious and her affect is mood appropriate.  There were no flight if ideas or any loose association.  Her attention concentration is fair.  [name] oriented x3.  There were no tremors or shakes.  Her insight judgment and impulse control is good.  Diagnosis:   Axis I: Psychotic Disorder  NOS Axis II: Deferred Axis III:  Past Medical History  Diagnosis Date  . Anxiety   . Phlebitis   . History of seasonal allergies   . Psychosis    Axis IV: other psychosocial or environmental problems Axis V: 51-60 moderate symptoms  Plan: I will continue Abilify 1 mg at bedtime.  Patient does not want to try higher dose.  [name] much comfortable with the current dose.  She does not have any tremors or shakes.  Recommend to call us back if she has any question or concern.  Followup in 3 months.  MEDICATIONS this encounter: Meds ordered this encounter  Medications  . ARIPiprazole (ABILIFY) 2 MG tablet    Sig: Take 1/2 tab at bed time    Dispense:  30 tablet    Refill:  3   Medical Decision Making Problem Points:  Established problem, stable/improving (1), Review of last therapy session (1) and Review of psycho-social stressors (1) Data Points:  Review or order clinical lab tests (1) Review of medication regiment & side effects (2)   Diannia Rudereborah Tayson Schnelle, MD

## 2013-08-29 ENCOUNTER — Ambulatory Visit (INDEPENDENT_AMBULATORY_CARE_PROVIDER_SITE_OTHER): Payer: BC Managed Care – PPO | Admitting: Obstetrics & Gynecology

## 2013-08-29 ENCOUNTER — Encounter: Payer: Self-pay | Admitting: Obstetrics & Gynecology

## 2013-08-29 VITALS — BP 161/84 | HR 73 | Resp 16 | Ht 65.0 in | Wt 220.0 lb

## 2013-08-29 DIAGNOSIS — Z124 Encounter for screening for malignant neoplasm of cervix: Secondary | ICD-10-CM

## 2013-08-29 DIAGNOSIS — Z1151 Encounter for screening for human papillomavirus (HPV): Secondary | ICD-10-CM

## 2013-08-29 DIAGNOSIS — Z01419 Encounter for gynecological examination (general) (routine) without abnormal findings: Secondary | ICD-10-CM

## 2013-08-29 DIAGNOSIS — Z780 Asymptomatic menopausal state: Secondary | ICD-10-CM | POA: Insufficient documentation

## 2013-08-29 NOTE — Progress Notes (Signed)
Patient ID: Alejandra Harrison, female   DOB: 02/26/1960, 53 y.o.   MRN: [id number]  Chief Complaint  Patient presents with  . Gynecologic Exam    HPI Alejandra Harrison is a 53 y.o. female.  [id number] No LMP recorded. Patient is postmenopausal. She says her last pap was years ago and was normal. HPI  Past Medical History  Diagnosis Date  . Anxiety   . Phlebitis   . History of seasonal allergies   . Psychosis     Past Surgical History  Procedure Laterality Date  . Tubal ligation      Family History  Problem Relation Age of Onset  . ADD / ADHD Neg Hx   . Alcohol abuse Neg Hx   . Drug abuse Neg Hx   . Anxiety disorder Neg Hx   . Bipolar disorder Neg Hx   . Depression Neg Hx   . OCD Neg Hx   . Paranoid behavior Neg Hx   . Schizophrenia Neg Hx   . Seizures Neg Hx   . Sexual abuse Neg Hx   . Physical abuse Neg Hx   . Dementia Mother     Social History History  Substance Use Topics  . Smoking status: Never Smoker   . Smokeless tobacco: Never Used  . Alcohol Use: No    Allergies  Allergen Reactions  . Oxycodone-Acetaminophen   . Penicillins     Current Outpatient Prescriptions  Medication Sig Dispense Refill  . ARIPiprazole (ABILIFY) 2 MG tablet Take 1/2 tab at bed time  30 tablet  3   No current facility-administered medications for this visit.    Review of Systems Review of Systems  Constitutional: Negative.   Respiratory: Negative.   Gastrointestinal: Negative.   Endocrine: Negative.   Genitourinary: Negative.     Blood pressure 161/84, pulse 73, resp. rate 16, height 5\' 5"  (1.651 m), weight 220 lb (99.791 kg).  Physical Exam Physical Exam  Constitutional: [name] oriented to person, place, and time. She appears well-developed. No distress.  Cardiovascular: Normal rate and regular rhythm.   Pulmonary/Chest: Effort normal and breath sounds normal.  Breasts without mass  Abdominal: Soft. She exhibits no distension and no mass. There is no tenderness.   Genitourinary: Uterus normal. No vaginal discharge found.  Pelvic exam: normal external genitalia, vulva, vagina, cervix, uterus and adnexa pap done.   Musculoskeletal: Normal range of motion.  Neurological: [name] alert and oriented to person, place, and time.  Skin: Skin is warm and dry.  Psychiatric: She has a normal mood and affect. Her behavior is normal.    Data Reviewed Meds, hx, notes  Assessment    Normal postmenopausal female examination     Plan    Pap result, mammogram, discuss screening colonoscopy with her family doctor.        Renso Swett 08/29/2013, 2:15 PM

## 2013-08-29 NOTE — Patient Instructions (Signed)
Mammography Mammography is an X-ray of the breasts to look for changes that are not normal. The X-ray image is called a mammogram. This procedure can screen for breast cancer, can detect cancer early, and can diagnose cancer.  LET YOUR CAREGIVER KNOW ABOUT:  Breast implants.  Previous breast disease, biopsy, or surgery.  If you are breastfeeding.  Medicines taken, including vitamins, herbs, eyedrops, over-the-counter medicines, and creams.  Use of steroids (by mouth or creams).  Possibility of pregnancy, if this applies. RISKS AND COMPLICATIONS  Exposure to radiation, but at very low levels.  The results may be misinterpreted.  The results may not be accurate.  Mammography may lead to further tests.  Mammography may not catch certain cancers. BEFORE THE PROCEDURE  Schedule your test about 7 days after your menstrual period. This is when your breasts are the least tender and have signs of hormone changes.  If you have had a mammography done at a different facility in the past, get the mammogram X-rays or have them sent to your current exam facility in order to compare them.  Wash your breasts and under your arms the day of the test.  Do not wear deodorants, perfumes, or powders anywhere on your body.  Wear clothes that you can change in and out of easily. PROCEDURE Relax as much as possible during the test. Any discomfort during the test will be very brief. The test should take less than 30 minutes. The following will happen:  You will undress from the waist up and put on a gown.  You will stand in front of the X-ray machine.  Each breast will be placed between 2 plastic or glass plates. The plates will compress your breast for a few seconds.  X-rays will be taken from different angles of the breast. AFTER THE PROCEDURE  The mammogram will be examined.  Depending on the quality of the images, you may need to repeat certain parts of the test.  Ask when your test  results will be ready. Make sure you get your test results.  You may resume normal activities. Document Released: 02/08/2000 Document Revised: 05/05/2011 Document Reviewed: 12/01/2010 [employer] Patient Information 2015 [employer]. This information is not intended to replace advice given to you by your health care provider. Make sure you discuss any questions you have with your health care provider.  

## 2013-08-31 LAB — CYTOLOGY - PAP

## 2013-10-05 ENCOUNTER — Encounter (HOSPITAL_COMMUNITY): Payer: Self-pay | Admitting: Psychiatry

## 2013-10-05 ENCOUNTER — Ambulatory Visit (INDEPENDENT_AMBULATORY_CARE_PROVIDER_SITE_OTHER): Payer: BC Managed Care – PPO | Admitting: Psychiatry

## 2013-10-05 VITALS — BP 110/80 | Ht 65.0 in | Wt 221.0 lb

## 2013-10-05 DIAGNOSIS — F5105 Insomnia due to other mental disorder: Secondary | ICD-10-CM

## 2013-10-05 DIAGNOSIS — F23 Brief psychotic disorder: Secondary | ICD-10-CM

## 2013-10-05 DIAGNOSIS — F29 Unspecified psychosis not due to a substance or known physiological condition: Secondary | ICD-10-CM

## 2013-10-05 MED ORDER — ARIPIPRAZOLE 2 MG PO TABS: ORAL_TABLET | ORAL | Status: DC

## 2013-10-05 NOTE — Progress Notes (Signed)
Patient ID: CHRYSTEN WOULFE, female   DOB: 08/27/1960, 53 y.o.   MRN: [id number] Patient ID: ADEENA BERNABE, female   DOB: 1960/12/31, 53 y.o.   MRN: [id number] Patient ID: ZEENAT JEANBAPTISTE, female   DOB: [birth year], 53 y.o.   MRN: [id number] Patient ID: ADDELYNN BATTE, female   DOB: 08-11-1960, 53 y.o.   MRN: [id number] [facility] [id number] Progress Note MAKYNNA MANOCCHIO MRN: [id number] DOB: 28-Aug-1960 Age: 53 y.o.  Date: 10/05/2013  Chief Complaint  Patient presents with  . Hallucinations  . Follow-up   History of present illness. As patient is a 53 year old married white female who lives with her husband and 2 sons ages 35 and 70 [place]. 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  The patient states that in 2012 she started seeing a figure that she thought may have been God. She also got fixated on a man in her Sunday school and thought that her husband would die and she would have to marry this man. She was having lots of religious visions. She claims that she had not been on any medication or using drugs or alcohol at that time. She had not had a head injury or any additional stress. She may of been going through menopause. She denied visual hallucinations but claims she felt "high and on top of the world." She's never had a history of depression however.  At any rate she was seen here by Dr. Toni Arthurs and started on Abilify. She was on a higher dose such as 5 mg in the past and it caused akathisia. Currently she takes 1 mg per day and it seems to be working fairly well. She's no longer having visions or unusual thoughts and she's functioning well in her job. She's not having significant side effects  The patient returns after 3 months. She has done well despite her husband's illness. He had congestive heart failure and developed gangrene in one of his toes and had to have it removed. He is now back at work. They were together all summer but now she is going back to work at school.  She's not had any of her previous visual hallucinations or delusions. We discussed at length the pros and cons of going off the medicine and given that she only takes it a small dose I think it's worth it to stay on it to prevent any further episodes. Patient started to have visual hallucination in 2012.  She was seen by Dr. Toni Arthurs and given Abilify.  Since then she's been taking the same medication without any side effects.  She denies any history of suicidal attempt or any inpatient psychiatric treatment.  Allergies: Allergies  Allergen Reactions  . Oxycodone-Acetaminophen   . Penicillins    Medical History: Past Medical History  Diagnosis Date  . Anxiety   . Phlebitis   . History of seasonal allergies   . Psychosis    Surgical History: Past Surgical History  Procedure Laterality Date  . Tubal ligation       Review of Systems  Constitutional: Positive for weight loss.  Musculoskeletal: Positive for back pain.  Psychiatric/Behavioral: Positive for depression. The patient is nervous/anxious and has insomnia.   All other systems reviewed and are negative.      Current Medications: Current Outpatient Prescriptions  Medication Sig Dispense Refill  . ARIPiprazole (ABILIFY) 2 MG tablet Take 1/2 tab at bed time  30 tablet  3   No  current facility-administered medications for this visit.   Lab Results:  Results for orders placed in visit on 08/29/13 (from the past 8736 hour(s))  CYTOLOGY - PAP   Collection Time    08/29/13 12:00 AM      Result Value Ref Range   CYTOLOGY - PAP PAP RESULT    Results for orders placed in visit on 02/22/13 (from the past 8736 hour(s))  POCT RAPID STREP A (OFFICE)   Collection Time    02/22/13  5:06 PM      Result Value Ref Range   Rapid Strep A Screen Negative  Negative   Family doctor did blood work last year and it all came back good.  Mental status examination Patient is obese female who is casually dressed and fairly groomed.  She  appears to be her stated age.  Her speech is clear and coherent.  Her thought process is  logical.  She denies any active or passive suicidal thoughts homicidal thought.  At this time she denies any visual or auditory hallucination.  She described her mood as anxious and her affect is mood appropriate.  There were no flight if ideas or any loose association.  Her attention concentration is fair.  [name] oriented x3.  There were no tremors or shakes.  Her insight judgment and impulse control is good.  Diagnosis:   Axis I: Psychotic Disorder NOS Axis II: Deferred Axis III:  Past Medical History  Diagnosis Date  . Anxiety   . Phlebitis   . History of seasonal allergies   . Psychosis    Axis IV: other psychosocial or environmental problems Axis V: 51-60 moderate symptoms  Plan: I will continue Abilify 1 mg at bedtime.  Patient does not want to try higher dose.  [name] much comfortable with the current dose.  She does not have any tremors or shakes.  Recommend to call us back if she has any question or concern.  Followup in 4 months.  MEDICATIONS this encounter: Meds ordered this encounter  Medications  . ARIPiprazole (ABILIFY) 2 MG tablet    Sig: Take 1/2 tab at bed time    Dispense:  30 tablet    Refill:  3   Medical Decision Making Problem Points:  Established problem, stable/improving (1), Review of last therapy session (1) and Review of psycho-social stressors (1) Data Points:  Review or order clinical lab tests (1) Review of medication regiment & side effects (2)   Alliya Marcon, MD

## 2013-12-26 ENCOUNTER — Encounter (HOSPITAL_COMMUNITY): Payer: Self-pay | Admitting: Psychiatry

## 2014-01-05 ENCOUNTER — Telehealth (HOSPITAL_COMMUNITY): Payer: Self-pay | Admitting: *Deleted

## 2014-02-09 ENCOUNTER — Encounter (HOSPITAL_COMMUNITY): Payer: Self-pay | Admitting: Psychiatry

## 2014-02-09 ENCOUNTER — Ambulatory Visit (INDEPENDENT_AMBULATORY_CARE_PROVIDER_SITE_OTHER): Payer: BC Managed Care – PPO | Admitting: Psychiatry

## 2014-02-09 VITALS — BP 144/58 | HR 58 | Ht 65.0 in | Wt 210.6 lb

## 2014-02-09 DIAGNOSIS — F489 Nonpsychotic mental disorder, unspecified: Secondary | ICD-10-CM

## 2014-02-09 DIAGNOSIS — F5105 Insomnia due to other mental disorder: Secondary | ICD-10-CM

## 2014-02-09 DIAGNOSIS — F23 Brief psychotic disorder: Secondary | ICD-10-CM

## 2014-02-09 MED ORDER — ARIPIPRAZOLE 2 MG PO TABS: ORAL_TABLET | ORAL | Status: DC

## 2014-02-09 NOTE — Progress Notes (Signed)
Patient ID: Alejandra Harrison, female   DOB: 03/03/1960, 53 y.o.   MRN: [id number] Patient ID: Alejandra Harrison, female   DOB: 04/19/1960, 53 y.o.   MRN: [id number] Patient ID: Alejandra Harrison, female   DOB: 01/03/1961, 53 y.o.   MRN: [id number] Patient ID: Alejandra Harrison, female   DOB: 11/27/1960, 53 y.o.   MRN: [id number] Patient ID: Alejandra Harrison, female   DOB: 01/30/1961, 53 y.o.   MRN: [id number] [facility] [id number] Progress Note Alejandra Harrison MRN: [id number] DOB: 04/06/1960 Age: 53 y.o.  Date: 02/09/2014  Chief Complaint  Patient presents with  . Hallucinations  . Follow-up   History of present illness. As patient is a 53 year old married white female who lives with her husband and 2 sons ages 2925 and 4520 in [place]. 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  The patient states that in 2012 she started seeing a figure that she thought may have been God. She also got fixated on a man in her Sunday school and thought that her husband would die and she would have to marry this man. She was having lots of religious visions. She claims that she had not been on any medication or using drugs or alcohol at that time. She had not had a head injury or any additional stress. She may of been going through menopause. She denied visual hallucinations but claims she felt "high and on top of the world." She's never had a history of depression however.  At any rate she was seen here by Dr. Toni ArthursFuller and started on Abilify. She was on a higher dose such as 5 mg in the past and it caused akathisia. Currently she takes 1 mg per day and it seems to be working fairly well. She's no longer having visions or unusual thoughts and she's functioning well in her job. She's not having significant side effects  The patient returns after 3 months. She has done well . She continues to work at [employer]. Her mood is been stable and she's had no further hallucinations or delusions. [name] sleeping well. She  denies any unusual thoughts. She denies any twitching or jerking in her muscles or akathisia  Allergies: Allergies  Allergen Reactions  . Oxycodone-Acetaminophen   . Penicillins    Medical History: Past Medical History  Diagnosis Date  . Anxiety   . Phlebitis   . History of seasonal allergies   . Psychosis    Surgical History: Past Surgical History  Procedure Laterality Date  . Tubal ligation       Review of Systems  Constitutional: Positive for weight loss.  Musculoskeletal: Positive for back pain.  Psychiatric/Behavioral: Positive for depression. The patient is nervous/anxious and has insomnia.   All other systems reviewed and are negative.      Current Medications: Current Outpatient Prescriptions  Medication Sig Dispense Refill  . ARIPiprazole (ABILIFY) 2 MG tablet Take 1/2 tab at bed time 30 tablet 3   No current facility-administered medications for this visit.   Lab Results:  Results for orders placed or performed in visit on 08/29/13 (from the past 8736 hour(s))  Cytology - PAP   Collection Time: 08/29/13 12:00 AM  Result Value Ref Range   CYTOLOGY - PAP PAP RESULT   Results for orders placed or performed in visit on 02/22/13 (from the past 8736 hour(s))  POCT rapid strep A   Collection Time: 02/22/13  5:06 PM  Result Value Ref Range   Rapid Strep A Screen Negative Negative   Family doctor did blood work last year and it all came back good.  Mental status examination Patient is obese female who is casually dressed and fairly groomed.  She appears to be her stated age.  Her speech is clear and coherent.  Her thought process is  logical.  She denies any active or passive suicidal thoughts homicidal thought.  At this time she denies any visual or auditory hallucination.  She described her mood as anxious and her affect is mood appropriate.  There were no flight if ideas or any loose association.  Her attention concentration is fair.  [name] oriented x3.   There were no tremors or shakes.  Her insight judgment and impulse control is good.  Diagnosis:   Axis I: Psychotic Disorder NOS Axis II: Deferred Axis III:  Past Medical History  Diagnosis Date  . Anxiety   . Phlebitis   . History of seasonal allergies   . Psychosis    Axis IV: other psychosocial or environmental problems Axis V: 51-60 moderate symptoms  Plan: I will continue Abilify 1 mg at bedtime.  Patient does not want to try higher dose.  [name] much comfortable with the current dose.  She does not have any tremors or shakes.  Recommend to call us back if she has any question or concern.  Followup in 4 months.  MEDICATIONS this encounter: Meds ordered this encounter  Medications  . ARIPiprazole (ABILIFY) 2 MG tablet    Sig: Take 1/2 tab at bed time    Dispense:  30 tablet    Refill:  3   Medical Decision Making Problem Points:  Established problem, stable/improving (1), Review of last therapy session (1) and Review of psycho-social stressors (1) Data Points:  Review or order clinical lab tests (1) Review of medication regiment & side effects (2)   Kemari Narez, MD

## 2014-02-13 ENCOUNTER — Ambulatory Visit (HOSPITAL_COMMUNITY): Payer: Self-pay | Admitting: Psychiatry

## 2014-05-22 ENCOUNTER — Ambulatory Visit (HOSPITAL_COMMUNITY): Payer: Self-pay | Admitting: Psychiatry

## 2014-05-23 ENCOUNTER — Ambulatory Visit (INDEPENDENT_AMBULATORY_CARE_PROVIDER_SITE_OTHER): Payer: BC Managed Care – PPO | Admitting: Psychiatry

## 2014-05-23 ENCOUNTER — Encounter (HOSPITAL_COMMUNITY): Payer: Self-pay | Admitting: Psychiatry

## 2014-05-23 VITALS — BP 120/80 | Ht 65.0 in | Wt 214.0 lb

## 2014-05-23 DIAGNOSIS — F5105 Insomnia due to other mental disorder: Secondary | ICD-10-CM

## 2014-05-23 DIAGNOSIS — F23 Brief psychotic disorder: Secondary | ICD-10-CM | POA: Diagnosis not present

## 2014-05-23 MED ORDER — ARIPIPRAZOLE 2 MG PO TABS: ORAL_TABLET | ORAL | Status: DC

## 2014-05-23 NOTE — Progress Notes (Signed)
Patient ID: Alejandra Harrison, female   DOB: 04/08/1960, 54 y.o.   MRN: [id number] Patient ID: Alejandra Harrison, female   DOB: 07/28/1960, 54 y.o.   MRN: [id number] Patient ID: Alejandra Harrison, female   DOB: 04/13/1960, 54 y.o.   MRN: [id number] Patient ID: Alejandra Harrison, female   DOB: 03/18/1960, 54 y.o.   MRN: [id number] Patient ID: Alejandra Harrison, female   DOB: 11/08/1960, 54 y.o.   MRN: [id number] Patient ID: Alejandra Harrison, female   DOB: 12/27/1960, 54 y.o.   MRN: [id number] [facility] [id number] Progress Note Alejandra Harrison MRN: [id number] DOB: 07/13/1960 Age: 54 y.o.  Date: 05/23/2014  Chief Complaint  Patient presents with  . Hallucinations  . Follow-up   History of present illness. As patient is a 54 year old married white female who lives with her husband and 2 sons ages 3025 and 4420 in [place]. 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  The patient states that in 2012 she started seeing a figure that she thought may have been God. She also got fixated on a man in her Sunday school and thought that her husband would die and she would have to marry this man. She was having lots of religious visions. She claims that she had not been on any medication or using drugs or alcohol at that time. She had not had a head injury or any additional stress. She may of been going through menopause. She denied visual hallucinations but claims she felt "high and on top of the world." She's never had a history of depression however.  At any rate she was seen here by Dr. Toni ArthursFuller and started on Abilify. She was on a higher dose such as 5 mg in the past and it caused akathisia. Currently she takes 1 mg per day and it seems to be working fairly well. She's no longer having visions or unusual thoughts and she's functioning well in her job. She's not having significant side effects  The patient returns after 4 months. She continues to do well. 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 She really enjoys her work. She's had a mammogram and an OB/GYN exam this year but has not seen a primary physician and had basic labs drawn. I encouraged her to do this and she agrees. She's not had any further psychotic symptoms delusions or hallucinations. She has not had any abnormal movements jerking or twitching. [name] sleeping well and feels good on her current medication  Allergies: Allergies  Allergen Reactions  . Oxycodone-Acetaminophen   . Penicillins    Medical History: Past Medical History  Diagnosis Date  . Anxiety   . Phlebitis   . History of seasonal allergies   . Psychosis    Surgical History: Past Surgical History  Procedure Laterality Date  . Tubal ligation       ROS     Current Medications: Current Outpatient Prescriptions  Medication Sig Dispense Refill  . ARIPiprazole (ABILIFY) 2 MG tablet Take 1/2 tab at bed time 30 tablet 3   No current facility-administered medications for this visit.   Lab Results:  Results for orders placed or performed in visit on 08/29/13 (from the past 8736 hour(s))  Cytology - PAP   Collection Time: 08/29/13 12:00 AM  Result Value Ref Range   CYTOLOGY - PAP PAP RESULT    Family doctor did blood work last year and  it all came back good.  Mental status examination Patient is obese female who is casually dressed and fairly groomed.  She appears to be her stated age.  Her speech is clear and coherent.  Her thought process is  logical.  She denies any active or passive suicidal thoughts homicidal thought.  At this time she denies any visual or auditory hallucination.  She described her mood as anxious and her affect is mood appropriate.  There were no flight if ideas or any loose association.  Her attention concentration is fair.  [name] oriented x3.  There were no tremors or shakes.  Her insight judgment and impulse control is good.  Diagnosis:   Axis I: Psychotic Disorder NOS Axis II: Deferred Axis III:  Past  Medical History  Diagnosis Date  . Anxiety   . Phlebitis   . History of seasonal allergies   . Psychosis    Axis IV: other psychosocial or environmental problems Axis V: 51-60 moderate symptoms  Plan: I will continue Abilify 1 mg at bedtime.  Patient does not want to try higher dose.  [name] much comfortable with the current dose.  She does not have any tremors or shakes.  Recommend to call us back if she has any question or concern.  Followup in 4 months.  MEDICATIONS this encounter: Meds ordered this encounter  Medications  . ARIPiprazole (ABILIFY) 2 MG tablet    Sig: Take 1/2 tab at bed time    Dispense:  30 tablet    Refill:  3   Medical Decision Making Problem Points:  Established problem, stable/improving (1), Review of last therapy session (1) and Review of psycho-social stressors (1) Data Points:  Review or order clinical lab tests (1) Review of medication regiment & side effects (2)   ROSS, DEBORAH, MD

## 2014-06-07 ENCOUNTER — Ambulatory Visit (INDEPENDENT_AMBULATORY_CARE_PROVIDER_SITE_OTHER): Payer: BC Managed Care – PPO | Admitting: Family Medicine

## 2014-06-07 ENCOUNTER — Encounter: Payer: Self-pay | Admitting: Family Medicine

## 2014-06-07 VITALS — BP 117/69 | HR 68 | Temp 99.3°F | Ht 65.0 in | Wt 211.0 lb

## 2014-06-07 DIAGNOSIS — J40 Bronchitis, not specified as acute or chronic: Secondary | ICD-10-CM

## 2014-06-07 DIAGNOSIS — R062 Wheezing: Secondary | ICD-10-CM | POA: Diagnosis not present

## 2014-06-07 DIAGNOSIS — J209 Acute bronchitis, unspecified: Secondary | ICD-10-CM

## 2014-06-07 LAB — POCT CBC
Granulocyte percent: 67.7 %G (ref 37–80)
HCT, POC: 42.4 % (ref 37.7–47.9)
Hemoglobin: 13.2 g/dL (ref 12.2–16.2)
LYMPH, POC: 2.6 (ref 0.6–3.4)
MCH, POC: 27.6 pg (ref 27–31.2)
MCHC: 31.1 g/dL — AB (ref 31.8–35.4)
MCV: 88.9 fL (ref 80–97)
MPV: 8.9 fL (ref 0–99.8)
PLATELET COUNT, POC: 305 10*3/uL (ref 142–424)
POC Granulocyte: 7.3 — AB (ref 2–6.9)
POC LYMPH %: 24.4 % (ref 10–50)
RBC: 4.77 M/uL (ref 4.04–5.48)
RDW, POC: 14.1 %
WBC: 10.8 10*3/uL — AB (ref 4.6–10.2)

## 2014-06-07 MED ORDER — ALBUTEROL SULFATE HFA 108 (90 BASE) MCG/ACT IN AERS: 2.0000 | INHALATION_SPRAY | Freq: Four times a day (QID) | RESPIRATORY_TRACT | Status: DC | PRN

## 2014-06-07 MED ORDER — AZITHROMYCIN 250 MG PO TABS: ORAL_TABLET | ORAL | Status: DC

## 2014-06-07 NOTE — Patient Instructions (Addendum)
The patient should drink plenty of fluids She should use a cool mist humidifier in her bedroom at nighttime She should take Mucinex, maximum strength, blue and white in color, 1 twice daily with a large glass of water for cough and congestion She should take the antibiotic until it is completed She should come back in the morning and get a chest x-ray Use inhaler 3 or 4 times daily and this may help the wheezing and tightness in the chest

## 2014-06-07 NOTE — Progress Notes (Signed)
Subjective:    Patient ID: Alejandra Harrison, female    DOB: 10/28/1960, 54 y.o.   MRN: [id number]  Patient is here today for an acute care visit for symptoms which include head and chest congestion with productive cough and low grade fever that started approximately 9 days ago. [name] especially coughing a lot at nighttime. [name] allergic to penicillin.   URI  Associated symptoms include congestion and coughing (prod, yellow). Pertinent negatives include no headaches.      Review of Systems  Constitutional: Positive for fever (low grade).  HENT: Positive for congestion and postnasal drip. Negative for hearing loss.   Eyes: Positive for itching.  Respiratory: Positive for cough (prod, yellow).   Cardiovascular: Negative.   Endocrine: Negative.   Genitourinary: Negative.   Neurological: Negative for dizziness and headaches.  Hematological: Negative.   Psychiatric/Behavioral: Negative.        Objective:   Physical Exam  Constitutional: [name] oriented to person, place, and time. She appears well-developed and well-nourished. No distress.  HENT:  Head: Normocephalic.  Right Ear: External ear normal.  Left Ear: External ear normal.  Redness in the posterior throat and nasal congestion bilaterally  Eyes: Conjunctivae and EOM are normal. Pupils are equal, round, and reactive to light. Right eye exhibits no discharge. Left eye exhibits no discharge. No scleral icterus.  Neck: Normal range of motion. Neck supple. No JVD present. No thyromegaly present.  Anterior cervical tenderness without adenopathy  Cardiovascular: Regular rhythm and normal heart sounds.   No murmur heard. The rate is about 48/m but it is regular.  Pulmonary/Chest: Effort normal. No respiratory distress. She has wheezes. She has no rales. She exhibits no tenderness.  There are rare wheezes and there is rhonchi with coughing and no rales  Musculoskeletal: Normal range of motion. She exhibits no edema.    Lymphadenopathy:    She has no cervical adenopathy.  Neurological: [name] alert and oriented to person, place, and time.  Skin: Skin is warm and dry. No rash noted.  Psychiatric: She has a normal mood and affect. Her behavior is normal. Judgment and thought content normal.  Nursing note and vitals reviewed.  BP 117/69 mmHg  Pulse 68  Temp(Src) 99.3 F (37.4 C) (Oral)  Ht 5\' 5"  (1.651 m)  Wt 211 lb (95.709 kg)  BMI 35.11 kg/m2  [facility] reading (PRIMARY) by  Dr. Tracie HarrierMoore-chest x-ray-this will be done in the morning at patient's convenience                                        Assessment & Plan:  1. Bronchitis with bronchospasm -The patient was instructed to drink plenty of fluids and to take Mucinex maximum strength 1 twice a day for cough and congestion -Albuterol inhaler 4 times daily for wheezing and chest tightness - azithromycin (ZITHROMAX) 250 MG tablet; 2 pills the first day then one daily for infection until completed  Dispense: 6 tablet; Refill: 0 - POCT CBC - DG Chest 2 View; Future  Patient Instructions  The patient should drink plenty of fluids She should use a cool mist humidifier in her bedroom at nighttime She should take Mucinex, maximum strength, blue and white in color, 1 twice daily with a large glass of water for cough and congestion She should take the antibiotic until it is completed She should come back in the morning  and get a chest x-ray Use inhaler 3 or 4 times daily and this may help the wheezing and tightness in the chest   Nyra Capes MD

## 2014-08-21 ENCOUNTER — Telehealth (HOSPITAL_COMMUNITY): Payer: Self-pay | Admitting: *Deleted

## 2014-09-22 ENCOUNTER — Ambulatory Visit (HOSPITAL_COMMUNITY): Payer: Self-pay | Admitting: Psychiatry

## 2014-09-29 ENCOUNTER — Encounter (HOSPITAL_COMMUNITY): Payer: Self-pay | Admitting: Psychiatry

## 2014-09-29 ENCOUNTER — Ambulatory Visit (INDEPENDENT_AMBULATORY_CARE_PROVIDER_SITE_OTHER): Payer: BC Managed Care – PPO | Admitting: Psychiatry

## 2014-09-29 VITALS — BP 134/85 | HR 41 | Ht 65.0 in | Wt 219.0 lb

## 2014-09-29 DIAGNOSIS — F489 Nonpsychotic mental disorder, unspecified: Secondary | ICD-10-CM | POA: Diagnosis not present

## 2014-09-29 DIAGNOSIS — F5105 Insomnia due to other mental disorder: Secondary | ICD-10-CM

## 2014-09-29 DIAGNOSIS — F23 Brief psychotic disorder: Secondary | ICD-10-CM

## 2014-09-29 MED ORDER — ARIPIPRAZOLE 2 MG PO TABS: ORAL_TABLET | ORAL | Status: DC

## 2014-09-29 NOTE — Progress Notes (Signed)
Patient ID: Alejandra Harrison, female   DOB: [birth year], 54 y.o.   MRN: [id number] Patient ID: Alejandra Harrison, female   DOB: 07-Jan-1961, 54 y.o.   MRN: [id number] Patient ID: Alejandra Harrison, female   DOB: Jun 10, 1960, 54 y.o.   MRN: [id number] Patient ID: Alejandra Harrison, female   DOB: 1960-11-11, 54 y.o.   MRN: [id number] Patient ID: Alejandra Harrison, female   DOB: 22-Jun-1960, 54 y.o.   MRN: [id number] Patient ID: Alejandra Harrison, female   DOB: 06-Aug-1960, 54 y.o.   MRN: [id number] Patient ID: Alejandra Harrison, female   DOB: 1960-09-23, 54 y.o.   MRN: [id number] [facility] [id number] Progress Note Alejandra Harrison MRN: [id number] DOB: [birth year] Age: 54 y.o.  Date: 09/29/2014  Chief Complaint  Patient presents with  . Hallucinations  . Follow-up   History of present illness. As patient is a 54 year old married white female who lives with her husband and 2 sons ages 28 and 47 [place]. 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  The patient states that in 2012 she started seeing a figure that she thought may have been God. She also got fixated on a man in her Sunday school and thought that her husband would die and she would have to marry this man. She was having lots of religious visions. She claims that she had not been on any medication or using drugs or alcohol at that time. She had not had a head injury or any additional stress. She may of been going through menopause. She denied visual hallucinations but claims she felt "high and on top of the world." She's never had a history of depression however.  At any rate she was seen here by Dr. Toni Arthurs and started on Abilify. She was on a higher dose such as 5 mg in the past and it caused akathisia. Currently she takes 1 mg per day and it seems to be working fairly well. She's no longer having visions or unusual thoughts and she's functioning well in her job. She's not having significant side effects  The patient returns after 5 months. She continues to do well.  She has been painting her house and staying busy this summer. She denies any auditory or visual hallucinations or unusual thoughts. She sleeping well most of the time and denies any current akathisia  Allergies: Allergies  Allergen Reactions  . Oxycodone-Acetaminophen   . Penicillins    Medical History: Past Medical History  Diagnosis Date  . Anxiety   . Phlebitis   . History of seasonal allergies   . Psychosis    Surgical History: Past Surgical History  Procedure Laterality Date  . Tubal ligation       ROS     Current Medications: Current Outpatient Prescriptions  Medication Sig Dispense Refill  . ARIPiprazole (ABILIFY) 2 MG tablet Take 1/2 tab at bed time 30 tablet 3   No current facility-administered medications for this visit.   Lab Results:  Results for orders placed or performed in visit on 06/07/14 (from the past 8736 hour(s))  POCT CBC   Collection Time: 06/07/14  5:41 PM  Result Value Ref Range   WBC 10.8 (A) 4.6 - 10.2 K/uL   Lymph, poc 2.6 0.6 - 3.4   POC LYMPH PERCENT 24.4 10 - 50 %L   POC Granulocyte 7.3 (A) 2 - 6.9   Granulocyte percent 67.7 37 - 80 %G   RBC 4.77 4.04 -  5.48 M/uL   Hemoglobin 13.2 12.2 - 16.2 g/dL   HCT, POC 16.1 09.6 - 47.9 %   MCV 88.9 80 - 97 fL   MCH, POC 27.6 27 - 31.2 pg   MCHC 31.1 (A) 31.8 - 35.4 g/dL   RDW, POC 04.5 %   Platelet Count, POC 305.0 142 - 424 K/uL   MPV 8.9 0 - 99.8 fL   Family doctor did blood work last year and it all came back good.  Mental status examination Patient is obese female who is casually dressed and fairly groomed.  She appears to be her stated age.  Her speech is clear and coherent.  Her thought process is  logical.  She denies any active or passive suicidal thoughts homicidal thought.  At this time she denies any visual or auditory hallucination.  She described her mood as good and her affect is mood appropriate.  There were no flight if ideas or any loose association.  Her attention  concentration is fair.  [name] oriented x3.  There were no tremors or shakes.  Her insight judgment and impulse control is good.  Diagnosis:   Axis I: Psychotic Disorder NOS Axis II: Deferred Axis III:  Past Medical History  Diagnosis Date  . Anxiety   . Phlebitis   . History of seasonal allergies   . Psychosis    Axis IV: other psychosocial or environmental problems Axis V: 51-60 moderate symptoms  Plan: I will continue Abilify 1 mg at bedtime to prevent recurrence of psychosis  Patient does not want to try higher dose.  [name] much comfortable with the current dose.  She does not have any tremors or shakes.  Recommend to call us back if she has any question or concern.  Followup in 6 months.  MEDICATIONS this encounter: Meds ordered this encounter  Medications  . ARIPiprazole (ABILIFY) 2 MG tablet    Sig: Take 1/2 tab at bed time    Dispense:  30 tablet    Refill:  3   Medical Decision Making Problem Points:  Established problem, stable/improving (1), Review of last therapy session (1) and Review of psycho-social stressors (1) Data Points:  Review or order clinical lab tests (1) Review of medication regiment & side effects (2)   Vincie Linn, MD

## 2015-01-05 ENCOUNTER — Encounter: Payer: Self-pay | Admitting: Family

## 2015-01-05 ENCOUNTER — Ambulatory Visit (INDEPENDENT_AMBULATORY_CARE_PROVIDER_SITE_OTHER): Payer: BC Managed Care – PPO | Admitting: Family

## 2015-01-05 VITALS — BP 132/92 | HR 81 | Temp 99.1°F | Ht 65.0 in | Wt 218.0 lb

## 2015-01-05 DIAGNOSIS — J069 Acute upper respiratory infection, unspecified: Secondary | ICD-10-CM | POA: Diagnosis not present

## 2015-01-05 DIAGNOSIS — J309 Allergic rhinitis, unspecified: Secondary | ICD-10-CM

## 2015-01-05 MED ORDER — METHYLPREDNISOLONE 4 MG PO TBPK: ORAL_TABLET | ORAL | Status: DC

## 2015-01-05 MED ORDER — FLUTICASONE PROPIONATE 50 MCG/ACT NA SUSP: 2.0000 | Freq: Every day | NASAL | Status: DC

## 2015-01-05 NOTE — Patient Instructions (Signed)
Upper Respiratory Infection, Adult Most upper respiratory infections (URIs) are a viral infection of the air passages leading to the lungs. A URI affects the nose, throat, and upper air passages. The most common type of URI is nasopharyngitis and is typically referred to as "the common cold." URIs run their course and usually go away on their own. Most of the time, a URI does not require medical attention, but sometimes a bacterial infection in the upper airways can follow a viral infection. This is called a secondary infection. Sinus and middle ear infections are common types of secondary upper respiratory infections. Bacterial pneumonia can also complicate a URI. A URI can worsen asthma and chronic obstructive pulmonary disease (COPD). Sometimes, these complications can require emergency medical care and may be life threatening.  CAUSES Almost all URIs are caused by viruses. A virus is a type of germ and can spread from one person to another.  RISKS FACTORS You may be at risk for a URI if:   You smoke.   You have chronic heart or lung disease.  You have a weakened defense (immune) system.   You are very young or very old.   You have nasal allergies or asthma.  You work in crowded or poorly ventilated areas.  You work in health care facilities or schools. SIGNS AND SYMPTOMS  Symptoms typically develop 2-3 days after you come in contact with a cold virus. Most viral URIs last 7-10 days. However, viral URIs from the influenza virus (flu virus) can last 14-18 days and are typically more severe. Symptoms may include:   Runny or stuffy (congested) nose.   Sneezing.   Cough.   Sore throat.   Headache.   Fatigue.   Fever.   Loss of appetite.   Pain in your forehead, behind your eyes, and over your cheekbones (sinus pain).  Muscle aches.  DIAGNOSIS  Your health care provider may diagnose a URI by:  Physical exam.  Tests to check that your symptoms are not due to  another condition such as:  Strep throat.  Sinusitis.  Pneumonia.  Asthma. TREATMENT  A URI goes away on its own with time. It cannot be cured with medicines, but medicines may be prescribed or recommended to relieve symptoms. Medicines may help:  Reduce your fever.  Reduce your cough.  Relieve nasal congestion. HOME CARE INSTRUCTIONS   Take medicines only as directed by your health care provider.   Gargle warm saltwater or take cough drops to comfort your throat as directed by your health care provider.  Use a warm mist humidifier or inhale steam from a shower to increase air moisture. This may make it easier to breathe.  Drink enough fluid to keep your urine clear or pale yellow.   Eat soups and other clear broths and maintain good nutrition.   Rest as needed.   Return to work when your temperature has returned to normal or as your health care provider advises. You may need to stay home longer to avoid infecting others. You can also use a face mask and careful hand washing to prevent spread of the virus.  Increase the usage of your inhaler if you have asthma.   Do not use any tobacco products, including cigarettes, chewing tobacco, or electronic cigarettes. If you need help quitting, ask your health care provider. PREVENTION  The best way to protect yourself from getting a cold is to practice good hygiene.   Avoid oral or hand contact with people with cold   symptoms.   Wash your hands often if contact occurs.  There is no clear evidence that vitamin C, vitamin E, echinacea, or exercise reduces the chance of developing a cold. However, it is always recommended to get plenty of rest, exercise, and practice good nutrition.  SEEK MEDICAL CARE IF:   You are getting worse rather than better.   Your symptoms are not controlled by medicine.   You have chills.  You have worsening shortness of breath.  You have brown or red mucus.  You have yellow or brown nasal  discharge.  You have pain in your face, especially when you bend forward.  You have a fever.  You have swollen neck glands.  You have pain while swallowing.  You have white areas in the back of your throat. SEEK IMMEDIATE MEDICAL CARE IF:   You have severe or persistent:  Headache.  Ear pain.  Sinus pain.  Chest pain.  You have chronic lung disease and any of the following:  Wheezing.  Prolonged cough.  Coughing up blood.  A change in your usual mucus.  You have a stiff neck.  You have changes in your:  Vision.  Hearing.  Thinking.  Mood. MAKE SURE YOU:   Understand these instructions.  Will watch your condition.  Will get help right away if you are not doing well or get worse.   This information is not intended to replace advice given to you by your health care provider. Make sure you discuss any questions you have with your health care provider.   Document Released: 08/06/2000 Document Revised: 06/27/2014 Document Reviewed: 05/18/2013 Elsevier Interactive Patient Education 2016 [employer]  - Take meds as prescribed - Use a cool mist humidifier  -Use saline nose sprays frequently -Saline irrigations of the nose can be very helpful if done frequently.  * 4X daily for 1 week*  * Use of a nettie pot can be helpful with this. Follow directions with this* -Force fluids -For any cough or congestion  Use plain Mucinex- regular strength or max strength is fine   * Children- consult with Pharmacist for dosing -For fever or aces or pains- take tylenol or ibuprofen appropriate for age and weight.  * for fevers greater than 101 orally you may alternate ibuprofen and tylenol every  3 hours. -Throat lozenges if help   Christy Hawks, FNP   

## 2015-01-05 NOTE — Progress Notes (Signed)
Subjective:    Patient ID: Alejandra Harrison, female    DOB: 02/28/1960, 54 y.o.   MRN: [id number]  URI  Associated symptoms include coughing and wheezing. Pertinent negatives include no ear pain, headaches or rhinorrhea.  Cough This is a new problem. The current episode started in the past 7 days. The problem has been unchanged. The problem occurs every few minutes. The cough is non-productive. Associated symptoms include postnasal drip and wheezing. Pertinent negatives include no chills, ear congestion, ear pain, fever, headaches, nasal congestion, rhinorrhea or shortness of breath. The symptoms are aggravated by lying down. She has tried rest and OTC cough suppressant for the symptoms. The treatment provided mild relief. There is no history of asthma or COPD.      Review of Systems  Constitutional: Negative.  Negative for fever and chills.  HENT: Positive for postnasal drip. Negative for ear pain and rhinorrhea.   Eyes: Negative.   Respiratory: Positive for cough and wheezing. Negative for shortness of breath.   Cardiovascular: Negative.  Negative for palpitations.  Gastrointestinal: Negative.   Endocrine: Negative.   Genitourinary: Negative.   Musculoskeletal: Negative.   Neurological: Negative.  Negative for headaches.  Hematological: Negative.   Psychiatric/Behavioral: Negative.   All other systems reviewed and are negative.      Objective:   Physical Exam  Constitutional: [name] oriented to person, place, and time. She appears well-developed and well-nourished. No distress.  HENT:  Head: Normocephalic and atraumatic.  Right Ear: External ear normal.  Left Ear: External ear normal.  Nasal passage erythemas with mild swelling  Oropharynx erythemas   Eyes: Pupils are equal, round, and reactive to light.  Neck: Normal range of motion. Neck supple. No thyromegaly present.  Cardiovascular: Normal rate, regular rhythm, normal heart sounds and intact distal pulses.   No murmur  heard. Pulmonary/Chest: Effort normal and breath sounds normal. No respiratory distress. She has no wheezes.  Coarse nonproductive cough   Abdominal: Soft. Bowel sounds are normal. She exhibits no distension. There is no tenderness.  Musculoskeletal: Normal range of motion. She exhibits no edema or tenderness.  Neurological: [name] alert and oriented to person, place, and time. She has normal reflexes. No cranial nerve deficit.  Skin: Skin is warm and dry.  Psychiatric: She has a normal mood and affect. Her behavior is normal. Judgment and thought content normal.  Vitals reviewed.   BP 132/92 mmHg  Pulse 81  Temp(Src) 99.1 F (37.3 C) (Oral)  Ht 5\' 5"  (1.651 m)  Wt 218 lb (98.884 kg)  BMI 36.28 kg/m2       Assessment & Plan:  1. Acute upper respiratory infection -- Take meds as prescribed - Use a cool mist humidifier  -Use saline nose sprays frequently -Saline irrigations of the nose can be very helpful if done frequently.  * 4X daily for 1 week*  * Use of a nettie pot can be helpful with this. Follow directions with this* -Force fluids -For any cough or congestion  Use plain Mucinex- regular strength or max strength is fine   * Children- consult with Pharmacist for dosing -For fever or aces or pains- take tylenol or ibuprofen appropriate for age and weight.  * for fevers greater than 101 orally you may alternate ibuprofen and tylenol every  3 hours. -Throat lozenges if help - methylPREDNISolone (MEDROL DOSEPAK) 4 MG TBPK tablet; Use as directed  Dispense: 21 tablet; Refill: 0  2. Allergic rhinitis, unspecified allergic rhinitis type - fluticasone (  FLONASE) 50 MCG/ACT nasal spray; Place 2 sprays into both nostrils daily.  Dispense: 16 g; Refill: [place], FNP

## 2015-01-23 ENCOUNTER — Ambulatory Visit (INDEPENDENT_AMBULATORY_CARE_PROVIDER_SITE_OTHER): Payer: BC Managed Care – PPO

## 2015-01-23 DIAGNOSIS — Z23 Encounter for immunization: Secondary | ICD-10-CM

## 2015-03-13 ENCOUNTER — Encounter (HOSPITAL_COMMUNITY): Payer: Self-pay | Admitting: Psychiatry

## 2015-03-13 ENCOUNTER — Ambulatory Visit (INDEPENDENT_AMBULATORY_CARE_PROVIDER_SITE_OTHER): Payer: BC Managed Care – PPO | Admitting: Psychiatry

## 2015-03-13 VITALS — BP 149/84 | HR 48 | Ht 65.0 in | Wt 218.8 lb

## 2015-03-13 DIAGNOSIS — F23 Brief psychotic disorder: Secondary | ICD-10-CM

## 2015-03-13 DIAGNOSIS — F489 Nonpsychotic mental disorder, unspecified: Secondary | ICD-10-CM

## 2015-03-13 DIAGNOSIS — F5105 Insomnia due to other mental disorder: Secondary | ICD-10-CM | POA: Diagnosis not present

## 2015-03-13 MED ORDER — ARIPIPRAZOLE 2 MG PO TABS: ORAL_TABLET | ORAL | Status: DC

## 2015-03-13 NOTE — Progress Notes (Signed)
Patient ID: Alejandra Harrison Harrison, female   DOB: [birth year], 55 y.o.   MRN: [id number] Patient ID: Alejandra Harrison Harrison, female   DOB: August 14, 1960, 54 y.o.   MRN: [id number] Patient ID: Alejandra Harrison Harrison, female   DOB: 02-28-1960, 69 y.o.   MRN: [id number] Patient ID: Alejandra Harrison Harrison, female   DOB: 1960-03-19, 35 y.o.   MRN: [id number] Patient ID: Alejandra Harrison Harrison, female   DOB: March 20, 1960, 55 y.o.   MRN: [id number] Patient ID: Alejandra Harrison Harrison, female   DOB: [birth year], 55 y.o.   MRN: [id number] Patient ID: Alejandra Harrison Harrison, female   DOB: 04-28-1960, 29 y.o.   MRN: [id number] Patient ID: Alejandra Harrison Harrison, female   DOB: 07/10/1960, 34 y.o.   MRN: [id number] [facility] [id number] Progress Note Alejandra Harrison Harrison MRN: [id number] DOB: [birth year] Age: 55 y.o.  Date: 03/13/2015  Chief Complaint  Patient presents with  . Hallucinations  . Follow-up   History of present illness. As patient is a 55 year old married white female who lives with her husband and 2 sons ages 5 and 77 in 22. 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  The patient states that in 2012 she started seeing a figure that she thought may have been God. She also got fixated on a man in her Sunday school and thought that her husband would die and she would have to marry this man. She was having lots of religious visions. She claims that she had not been on any medication or using drugs or alcohol at that time. She had not had a head injury or any additional stress. She may of been going through menopause. She denied visual hallucinations but claims she felt "high and on top of the world." She's never had a history of depression however.  At any rate she was seen here by Dr. Toni Arthurs and started on Abilify. She was on a higher dose such as 5 mg in the past and it caused akathisia. Currently she takes 1 mg per day and it seems to be working fairly well. She's no longer having visions or unusual thoughts and she's functioning well in her job. She's not having significant  side effects  The patient returns after 5 months. She states that she's had a problem lately with feeling lightheaded and as if she is going to fall at times. She did have an upper respiratory infection in November and was treated with Medrol Dosepak. She's had no further psychotic symptoms such as auditory or visual hallucinations visions or paranoia. [name] having these lightheaded spells several times a week and I strongly suggested she talk to her family doctor. Her pulse rate is a bit slow today but her other vitals are normal. I doubt this is really related to the Abilify since she has been on it so long already. She's not had a physical in quite some time  Allergies: Allergies  Allergen Reactions  . Oxycodone-Acetaminophen   . Penicillins    Medical History: Past Medical History  Diagnosis Date  . Anxiety   . Phlebitis   . History of seasonal allergies   . Psychosis    Surgical History: Past Surgical History  Procedure Laterality Date  . Tubal ligation       ROS     Current Medications: Current Outpatient Prescriptions  Medication Sig Dispense Refill  . ARIPiprazole (ABILIFY) 2 MG tablet Take 1/2 tab at bed time 30 tablet 3   No current facility-administered  medications for this visit.   Lab Results:  Results for orders placed or performed in visit on 06/07/14 (from the past 8736 hour(s))  POCT CBC   Collection Time: 06/07/14  5:41 PM  Result Value Ref Range   WBC 10.8 (A) 4.6 - 10.2 K/uL   Lymph, poc 2.6 0.6 - 3.4   POC LYMPH PERCENT 24.4 10 - 50 %L   POC Granulocyte 7.3 (A) 2 - 6.9   Granulocyte percent 67.7 37 - 80 %G   RBC 4.77 4.04 - 5.48 M/uL   Hemoglobin 13.2 12.2 - 16.2 g/dL   HCT, POC 16.1 09.6 - 47.9 %   MCV 88.9 80 - 97 fL   MCH, POC 27.6 27 - 31.2 pg   MCHC 31.1 (A) 31.8 - 35.4 g/dL   RDW, POC 04.5 %   Platelet Count, POC 305.0 142 - 424 K/uL   MPV 8.9 0 - 99.8 fL   Family doctor did blood work last year and it all came back good.  Mental  status examination Patient is obese female who is casually dressed and fairly groomed.  She appears to be her stated age.  Her speech is clear and coherent.  Her thought process is  logical.  She denies any active or passive suicidal thoughts homicidal thought.  At this time she denies any visual or auditory hallucination.  She described her mood as good and her affect is mood appropriate. She did get a bit tearful when describing these lightheaded events and they seem to be really worrying her  There were no flight of ideas or any loose association.  Her attention concentration is fair.  [name] oriented x3.  There were no tremors or shakes.  Her insight judgment and impulse control is good.  Diagnosis:   Axis I: Psychotic Disorder NOS Axis II: Deferred Axis III:  Past Medical History  Diagnosis Date  . Anxiety   . Phlebitis   . History of seasonal allergies   . Psychosis    Axis IV: other psychosocial or environmental problems Axis V: 51-60 moderate symptoms  Plan: I will continue Abilify 1 mg at bedtime to prevent recurrence of psychosis  Patient does not want to try higher dose.  [name] much comfortable with the current dose.  She does not have any tremors or shakes.  Recommend to call us back if she has any question or concern.  She has been instructed to follow-up with her family doctor regarding the lightheaded spells. She will Follow-up with [place] in 3 months  MEDICATIONS this encounter: Meds ordered this encounter  Medications  . ARIPiprazole (ABILIFY) 2 MG tablet    Sig: Take 1/2 tab at bed time    Dispense:  30 tablet    Refill:  3   Medical Decision Making Problem Points:  Established problem, stable/improving (1), Review of last therapy session (1) and Review of psycho-social stressors (1) Data Points:  Review or order clinical lab tests (1) Review of medication regiment & side effects (2)   ROSS, DEBORAH, MD

## 2015-03-14 ENCOUNTER — Encounter: Payer: Self-pay | Admitting: Family

## 2015-03-14 ENCOUNTER — Ambulatory Visit (INDEPENDENT_AMBULATORY_CARE_PROVIDER_SITE_OTHER): Payer: BC Managed Care – PPO | Admitting: Family

## 2015-03-14 VITALS — BP 124/81 | HR 70 | Temp 97.7°F | Ht 65.0 in | Wt 219.8 lb

## 2015-03-14 DIAGNOSIS — N3001 Acute cystitis with hematuria: Secondary | ICD-10-CM | POA: Diagnosis not present

## 2015-03-14 DIAGNOSIS — Z1211 Encounter for screening for malignant neoplasm of colon: Secondary | ICD-10-CM

## 2015-03-14 DIAGNOSIS — Z1159 Encounter for screening for other viral diseases: Secondary | ICD-10-CM

## 2015-03-14 DIAGNOSIS — Z01419 Encounter for gynecological examination (general) (routine) without abnormal findings: Secondary | ICD-10-CM | POA: Diagnosis not present

## 2015-03-14 DIAGNOSIS — Z23 Encounter for immunization: Secondary | ICD-10-CM

## 2015-03-14 DIAGNOSIS — R42 Dizziness and giddiness: Secondary | ICD-10-CM | POA: Diagnosis not present

## 2015-03-14 DIAGNOSIS — Z0000 Encounter for general adult medical examination without abnormal findings: Secondary | ICD-10-CM

## 2015-03-14 LAB — POCT UA - MICROSCOPIC ONLY
CRYSTALS, UR, HPF, POC: NEGATIVE
Casts, Ur, LPF, POC: NEGATIVE
Mucus, UA: NEGATIVE
YEAST UA: NEGATIVE

## 2015-03-14 LAB — POCT URINALYSIS DIPSTICK
BILIRUBIN UA: NEGATIVE
GLUCOSE UA: NEGATIVE
Ketones, UA: NEGATIVE
Nitrite, UA: NEGATIVE
Protein, UA: NEGATIVE
Spec Grav, UA: 1.01
UROBILINOGEN UA: NEGATIVE
pH, UA: 5

## 2015-03-14 MED ORDER — NITROFURANTOIN MONOHYD MACRO 100 MG PO CAPS: 100.0000 mg | ORAL_CAPSULE | Freq: Two times a day (BID) | ORAL | Status: DC

## 2015-03-14 NOTE — Progress Notes (Signed)
Subjective:    Patient ID: Alejandra Harrison, female    DOB: 1960/03/09, 55 y.o.   MRN: [id number]   HPI Pt presents to the office today for CPE with pap. PT goes to [facility] every 3 months for psychosis. Pt is currently taking Abilify for psychosis and hallucinations and states this is working well for her. PT states for the last few weeks she has been having some intermittent dizziness. Pt states the dizziness is the same if she is standing, sitting, or bending.  Pt states she has her eyes exam and new glasses in June 2016.    Review of Systems  Constitutional: Negative.   HENT: Negative.   Eyes: Negative.   Respiratory: Negative.  Negative for shortness of breath.   Cardiovascular: Negative.  Negative for palpitations.  Gastrointestinal: Negative.   Endocrine: Negative.   Genitourinary: Negative.   Musculoskeletal: Negative.   Neurological: Negative.  Negative for headaches.  Hematological: Negative.   Psychiatric/Behavioral: Negative.   All other systems reviewed and are negative.      Objective:   Physical Exam  Constitutional: [name] oriented to person, place, and time. She appears well-developed and well-nourished. No distress.  HENT:  Head: Normocephalic and atraumatic.  Right Ear: External ear normal.  Left Ear: External ear normal.  Nose: Nose normal.  Mouth/Throat: Oropharynx is clear and moist.  Eyes: Pupils are equal, round, and reactive to light.  Neck: Normal range of motion. Neck supple. No thyromegaly present.  Cardiovascular: Normal rate, regular rhythm, normal heart sounds and intact distal pulses.   No murmur heard. Pulmonary/Chest: Effort normal and breath sounds normal. No respiratory distress. She has no wheezes. Right breast exhibits no inverted nipple, no mass, no nipple discharge, no skin change and no tenderness. Left breast exhibits no inverted nipple, no mass, no nipple discharge, no skin change and no tenderness. Breasts are symmetrical.    Abdominal: Soft. Bowel sounds are normal. She exhibits no distension. There is no tenderness.  Genitourinary: Vagina normal.  Bimanual exam- no adnexal masses or tenderness, ovaries nonpalpable   Cervix parous and pink- No discharge   Musculoskeletal: Normal range of motion. She exhibits no edema or tenderness.  Neurological: [name] alert and oriented to person, place, and time. She has normal reflexes. No cranial nerve deficit.  Skin: Skin is warm and dry.  Psychiatric: She has a normal mood and affect. Her behavior is normal. Judgment and thought content normal.  Vitals reviewed.   BP 124/81 mmHg  Pulse 70  Temp(Src) 97.7 F (36.5 C) (Oral)  Ht '5\' 5"'$  (1.651 m)  Wt 219 lb 12.8 oz (99.701 kg)  BMI 36.58 kg/m2  Results for orders placed or performed in visit on 03/14/15  POCT urinalysis dipstick  Result Value Ref Range   Color, UA gold    Clarity, UA clear    Glucose, UA neg    Bilirubin, UA neg'    Ketones, UA neg    Spec Grav, UA 1.010    Blood, UA large    pH, UA 5.0    Protein, UA neg    Urobilinogen, UA negative    Nitrite, UA neg    Leukocytes, UA moderate (2+) (A) Negative  POCT UA - Microscopic Only  Result Value Ref Range   WBC, Ur, HPF, POC 1-3    RBC, urine, microscopic 5-10    Bacteria, U Microscopic occ    Mucus, UA neg    Epithelial cells, urine per micros mod  Crystals, Ur, HPF, POC neg    Casts, Ur, LPF, POC neg    Yeast, UA neg         Assessment & Plan:  1. Encounter for routine gynecological examination - POCT urinalysis dipstick - POCT UA - Microscopic Only - CMP14+EGFR - Pap IG w/ reflex to HPV when ASC-U  2. Annual physical exam - Anemia Profile B - CMP14+EGFR - Hepatitis C antibody - VITAMIN D 25 Hydroxy (Vit-D Deficiency, Fractures) - Thyroid Panel With TSH - Lipid panel - Pap IG w/ reflex to HPV when ASC-U - Ambulatory referral to Gastroenterology  3. Need for hepatitis C screening test - CMP14+EGFR  4. Encounter for  screening colonoscopy - CMP14+EGFR - Ambulatory referral to Gastroenterology  5. Dizziness - Anemia Profile B - CMP14+EGFR  6. Acute cystitis with hematuria - nitrofurantoin, macrocrystal-monohydrate, (MACROBID) 100 MG capsule; Take 1 capsule (100 mg total) by mouth 2 (two) times daily.  Dispense: 10 capsule; Refill: 0   Continue all meds Labs pending Health Maintenance reviewed- TDAP given today, pt to schedule mammogram today Diet and exercise encouraged RTO 1 year  Evelina Dun, FNP

## 2015-03-14 NOTE — Addendum Note (Signed)
Addended by: Almeta Monas on: 03/14/2015 11:13 AM   Modules accepted: Orders

## 2015-03-14 NOTE — Patient Instructions (Addendum)
Asymptomatic Bacteriuria, Female Asymptomatic bacteriuria is the presence of a large number of bacteria in your urine without the usual symptoms of burning or frequent urination. The following conditions increase the risk of asymptomatic bacteriuria:  Diabetes mellitus.  Advanced age.  Pregnancy in the first trimester.  Kidney stones.  Kidney transplants.  Leaky kidney tube valve in young children (reflux). Treatment for this condition is not needed in most people and can lead to other problems such as too much yeast and growth of resistant bacteria. However, some people, such as pregnant women, do need treatment to prevent kidney infection. Asymptomatic bacteriuria in pregnancy is also associated with fetal growth restriction, premature labor, and newborn death. HOME CARE INSTRUCTIONS Monitor your condition for any changes. The following actions may help to relieve any discomfort you are feeling:  Drink enough water and fluids to keep your urine clear or pale yellow. Go to the bathroom more often to keep your bladder empty.  Keep the area around your vagina and rectum clean. Wipe yourself from front to back after urinating. SEEK IMMEDIATE MEDICAL CARE IF:  You develop signs of an infection such as:  Burning with urination.  Frequency of voiding.  Back pain.  Fever.  You have blood in the urine.  You develop a fever. MAKE SURE YOU:  Understand these instructions.  Will watch your condition.  Will get help right away if you are not doing well or get worse.   This information is not intended to replace advice given to you by your health care provider. Make sure you discuss any questions you have with your health care provider.   Document Released: 02/10/2005 Document Revised: 03/03/2014 Document Reviewed: 08/02/2012 Elsevier Interactive Patient Education 2016 [place] Maintenance, Female Adopting a healthy lifestyle and getting preventive care can go a long  way to promote health and wellness. Talk with your health care provider about what schedule of regular examinations is right for you. This is a good chance for you to check in with your provider about disease prevention and staying healthy. In between checkups, there are plenty of things you can do on your own. Experts have done a lot of research about which lifestyle changes and preventive measures are most likely to keep you healthy. Ask your health care provider for more information. WEIGHT AND DIET  Eat a healthy diet  Be sure to include plenty of vegetables, fruits, low-fat dairy products, and lean protein.  Do not eat a lot of foods high in solid fats, added sugars, or salt.  Get regular exercise. This is one of the most important things you can do for your health.  Most adults should exercise for at least 150 minutes each week. The exercise should increase your heart rate and make you sweat (moderate-intensity exercise).  Most adults should also do strengthening exercises at least twice a week. This is in addition to the moderate-intensity exercise.  Maintain a healthy weight  Body mass index (BMI) is a measurement that can be used to identify possible weight problems. It estimates body fat based on height and weight. Your health care provider can help determine your BMI and help you achieve or maintain a healthy weight.  For females 44 years of age and older:   A BMI below 18.5 is considered underweight.  A BMI of 18.5 to 24.9 is normal.  A BMI of 25 to 29.9 is considered overweight.  A BMI of 30 and above is considered obese.  Watch levels of  cholesterol and blood lipids  You should start having your blood tested for lipids and cholesterol at 56 years of age, then have this test every 5 years.  You may need to have your cholesterol levels checked more often if:  Your lipid or cholesterol levels are high.  You are older than 56 years of age.  You are at high risk for  heart disease.  CANCER SCREENING   Lung Cancer  Lung cancer screening is recommended for adults 31-71 years old who are at high risk for lung cancer because of a history of smoking.  A yearly low-dose CT scan of the lungs is recommended for people who:  Currently smoke.  Have quit within the past 15 years.  Have at least a 30-pack-year history of smoking. A pack year is smoking an average of one pack of cigarettes a day for 1 year.  Yearly screening should continue until it has been 15 years since you quit.  Yearly screening should stop if you develop a health problem that would prevent you from having lung cancer treatment.  Breast Cancer  Practice breast self-awareness. This means understanding how your breasts normally appear and feel.  It also means doing regular breast self-exams. Let your health care provider know about any changes, no matter how small.  If you are in your 20s or 30s, you should have a clinical breast exam (CBE) by a health care provider every 1-3 years as part of a regular health exam.  If you are 42 or older, have a CBE every year. Also consider having a breast X-ray (mammogram) every year.  If you have a family history of breast cancer, talk to your health care provider about genetic screening.  If you are at high risk for breast cancer, talk to your health care provider about having an MRI and a mammogram every year.  Breast cancer gene (BRCA) assessment is recommended for women who have family members with BRCA-related cancers. BRCA-related cancers include:  Breast.  Ovarian.  Tubal.  Peritoneal cancers.  Results of the assessment will determine the need for genetic counseling and BRCA1 and BRCA2 testing. Cervical Cancer Your health care provider may recommend that you be screened regularly for cancer of the pelvic organs (ovaries, uterus, and vagina). This screening involves a pelvic examination, including checking for microscopic changes to  the surface of your cervix (Pap test). You may be encouraged to have this screening done every 3 years, beginning at age 62.  For women ages 28-65, health care providers may recommend pelvic exams and Pap testing every 3 years, or they may recommend the Pap and pelvic exam, combined with testing for human papilloma virus (HPV), every 5 years. Some types of HPV increase your risk of cervical cancer. Testing for HPV may also be done on women of any age with unclear Pap test results.  Other health care providers may not recommend any screening for nonpregnant women who are considered low risk for pelvic cancer and who do not have symptoms. Ask your health care provider if a screening pelvic exam is right for you.  If you have had past treatment for cervical cancer or a condition that could lead to cancer, you need Pap tests and screening for cancer for at least 20 years after your treatment. If Pap tests have been discontinued, your risk factors (such as having a new sexual partner) need to be reassessed to determine if screening should resume. Some women have medical problems that increase the chance  of getting cervical cancer. In these cases, your health care provider may recommend more frequent screening and Pap tests. Colorectal Cancer  This type of cancer can be detected and often prevented.  Routine colorectal cancer screening usually begins at 55 years of age and continues through 56 years of age.  Your health care provider may recommend screening at an earlier age if you have risk factors for colon cancer.  Your health care provider may also recommend using home test kits to check for hidden blood in the stool.  A small camera at the end of a tube can be used to examine your colon directly (sigmoidoscopy or colonoscopy). This is done to check for the earliest forms of colorectal cancer.  Routine screening usually begins at age 77.  Direct examination of the colon should be repeated every  5-10 years through 56 years of age. However, you may need to be screened more often if early forms of precancerous polyps or small growths are found. Skin Cancer  Check your skin from head to toe regularly.  Tell your health care provider about any new moles or changes in moles, especially if there is a change in a mole's shape or color.  Also tell your health care provider if you have a mole that is larger than the size of a pencil eraser.  Always use sunscreen. Apply sunscreen liberally and repeatedly throughout the day.  Protect yourself by wearing long sleeves, pants, a wide-brimmed hat, and sunglasses whenever you are outside. HEART DISEASE, DIABETES, AND HIGH BLOOD PRESSURE   High blood pressure causes heart disease and increases the risk of stroke. High blood pressure is more likely to develop in:  People who have blood pressure in the high end of the normal range (130-139/85-89 mm Hg).  People who are overweight or obese.  People who are African American.  If you are 21-39 years of age, have your blood pressure checked every 3-5 years. If you are 36 years of age or older, have your blood pressure checked every year. You should have your blood pressure measured twice--once when you are at a hospital or clinic, and once when you are not at a hospital or clinic. Record the average of the two measurements. To check your blood pressure when you are not at a hospital or clinic, you can use:  An automated blood pressure machine at a pharmacy.  A home blood pressure monitor.  If you are between 42 years and 26 years old, ask your health care provider if you should take aspirin to prevent strokes.  Have regular diabetes screenings. This involves taking a blood sample to check your fasting blood sugar level.  If you are at a normal weight and have a low risk for diabetes, have this test once every three years after 56 years of age.  If you are overweight and have a high risk for  diabetes, consider being tested at a younger age or more often. PREVENTING INFECTION  Hepatitis B  If you have a higher risk for hepatitis B, you should be screened for this virus. You are considered at high risk for hepatitis B if:  You were born in a country where hepatitis B is common. Ask your health care provider which countries are considered high risk.  Your parents were born in a high-risk country, and you have not been immunized against hepatitis B (hepatitis B vaccine).  You have HIV or AIDS.  You use needles to inject street drugs.  You  live with someone who has hepatitis B.  You have had sex with someone who has hepatitis B.  You get hemodialysis treatment.  You take certain medicines for conditions, including cancer, organ transplantation, and autoimmune conditions. Hepatitis C  Blood testing is recommended for:  Everyone born from [birth year] through 1966.  Anyone with known risk factors for hepatitis C. Sexually transmitted infections (STIs)  You should be screened for sexually transmitted infections (STIs) including gonorrhea and chlamydia if:  You are sexually active and are younger than 55 years of age.  You are older than 56 years of age and your health care provider tells you that you are at risk for this type of infection.  Your sexual activity has changed since you were last screened and you are at an increased risk for chlamydia or gonorrhea. Ask your health care provider if you are at risk.  If you do not have HIV, but are at risk, it may be recommended that you take a prescription medicine daily to prevent HIV infection. This is called pre-exposure prophylaxis (PrEP). You are considered at risk if:  You are sexually active and do not regularly use condoms or know the HIV status of your partner(s).  You take drugs by injection.  You are sexually active with a partner who has HIV. Talk with your health care provider about whether you are at high risk of  being infected with HIV. If you choose to begin PrEP, you should first be tested for HIV. You should then be tested every 3 months for as long as you are taking PrEP.  PREGNANCY   If you are premenopausal and you may become pregnant, ask your health care provider about preconception counseling.  If you may become pregnant, take 400 to 800 micrograms (mcg) of folic acid every day.  If you want to prevent pregnancy, talk to your health care provider about birth control (contraception). OSTEOPOROSIS AND MENOPAUSE   Osteoporosis is a disease in which the bones lose minerals and strength with aging. This can result in serious bone fractures. Your risk for osteoporosis can be identified using a bone density scan.  If you are 59 years of age or older, or if you are at risk for osteoporosis and fractures, ask your health care provider if you should be screened.  Ask your health care provider whether you should take a calcium or vitamin D supplement to lower your risk for osteoporosis.  Menopause may have certain physical symptoms and risks.  Hormone replacement therapy may reduce some of these symptoms and risks. Talk to your health care provider about whether hormone replacement therapy is right for you.  HOME CARE INSTRUCTIONS   Schedule regular health, dental, and eye exams.  Stay current with your immunizations.   Do not use any tobacco products including cigarettes, chewing tobacco, or electronic cigarettes.  If you are pregnant, do not drink alcohol.  If you are breastfeeding, limit how much and how often you drink alcohol.  Limit alcohol intake to no more than 1 drink per day for nonpregnant women. One drink equals 12 ounces of beer, 5 ounces of wine, or 1 ounces of hard liquor.  Do not use street drugs.  Do not share needles.  Ask your health care provider for help if you need support or information about quitting drugs.  Tell your health care provider if you often feel  depressed.  Tell your health care provider if you have ever been abused or do not feel safe  at home.   This information is not intended to replace advice given to you by your health care provider. Make sure you discuss any questions you have with your health care provider.   Document Released: 08/26/2010 Document Revised: 03/03/2014 Document Reviewed: 01/12/2013 Elsevier Interactive Patient Education [employer].

## 2015-03-15 ENCOUNTER — Other Ambulatory Visit: Payer: Self-pay | Admitting: Family

## 2015-03-15 DIAGNOSIS — E559 Vitamin D deficiency, unspecified: Secondary | ICD-10-CM | POA: Insufficient documentation

## 2015-03-15 LAB — LIPID PANEL
CHOL/HDL RATIO: 2.9 ratio (ref 0.0–4.4)
Cholesterol, Total: 180 mg/dL (ref 100–199)
HDL: 62 mg/dL (ref >39)
LDL CALC: 97 mg/dL (ref 0–99)
Triglycerides: 103 mg/dL (ref 0–149)
VLDL Cholesterol Cal: 21 mg/dL (ref 5–40)

## 2015-03-15 LAB — ANEMIA PROFILE B
Basophils Absolute: 0.1 10*3/uL (ref 0.0–0.2)
Basos: 1 %
EOS (ABSOLUTE): 0.1 10*3/uL (ref 0.0–0.4)
Eos: 1 %
FERRITIN: 205 ng/mL — AB (ref 15–150)
FOLATE: 14.1 ng/mL (ref >3.0)
HEMATOCRIT: 41.3 % (ref 34.0–46.6)
HEMOGLOBIN: 14.3 g/dL (ref 11.1–15.9)
IMMATURE GRANS (ABS): 0 10*3/uL (ref 0.0–0.1)
IMMATURE GRANULOCYTES: 0 %
Iron Saturation: 18 % (ref 15–55)
Iron: 57 ug/dL (ref 27–159)
LYMPHS ABS: 1.6 10*3/uL (ref 0.7–3.1)
Lymphs: 21 %
MCH: 30.7 pg (ref 26.6–33.0)
MCHC: 34.6 g/dL (ref 31.5–35.7)
MCV: 89 fL (ref 79–97)
Monocytes Absolute: 0.4 10*3/uL (ref 0.1–0.9)
Monocytes: 5 %
Neutrophils Absolute: 5.5 10*3/uL (ref 1.4–7.0)
Neutrophils: 72 %
Platelets: 295 10*3/uL (ref 150–379)
RBC: 4.66 x10E6/uL (ref 3.77–5.28)
RDW: 14.1 % (ref 12.3–15.4)
Retic Ct Pct: 1.6 % (ref 0.6–2.6)
TIBC: 311 ug/dL (ref 250–450)
UIBC: 254 ug/dL (ref 131–425)
Vitamin B-12: 215 pg/mL (ref 211–946)
WBC: 7.7 10*3/uL (ref 3.4–10.8)

## 2015-03-15 LAB — HEPATITIS C ANTIBODY

## 2015-03-15 LAB — VITAMIN D 25 HYDROXY (VIT D DEFICIENCY, FRACTURES): VIT D 25 HYDROXY: 10.8 ng/mL — AB (ref 30.0–100.0)

## 2015-03-15 LAB — THYROID PANEL WITH TSH
Free Thyroxine Index: 2.6 (ref 1.2–4.9)
T3 UPTAKE RATIO: 24 % (ref 24–39)
T4 TOTAL: 11 ug/dL (ref 4.5–12.0)
TSH: 2.73 u[IU]/mL (ref 0.450–4.500)

## 2015-03-15 LAB — CMP14+EGFR
ALK PHOS: 78 IU/L (ref 39–117)
ALT: 30 IU/L (ref 0–32)
AST: 22 IU/L (ref 0–40)
Albumin/Globulin Ratio: 1.9 (ref 1.1–2.5)
Albumin: 4.5 g/dL (ref 3.5–5.5)
BUN/Creatinine Ratio: 12 (ref 9–23)
BUN: 9 mg/dL (ref 6–24)
Bilirubin Total: 0.4 mg/dL (ref 0.0–1.2)
CO2: 21 mmol/L (ref 18–29)
CREATININE: 0.75 mg/dL (ref 0.57–1.00)
Calcium: 9.6 mg/dL (ref 8.7–10.2)
Chloride: 101 mmol/L (ref 96–106)
GFR calc Af Amer: 104 mL/min/{1.73_m2} (ref >59)
GFR calc non Af Amer: 90 mL/min/{1.73_m2} (ref >59)
GLUCOSE: 80 mg/dL (ref 65–99)
Globulin, Total: 2.4 g/dL (ref 1.5–4.5)
Potassium: 4.7 mmol/L (ref 3.5–5.2)
SODIUM: 140 mmol/L (ref 134–144)
Total Protein: 6.9 g/dL (ref 6.0–8.5)

## 2015-03-15 MED ORDER — VITAMIN D (ERGOCALCIFEROL) 1.25 MG (50000 UNIT) PO CAPS
50000.0000 [IU] | ORAL_CAPSULE | ORAL | Status: DC
Start: 2015-03-15 — End: 2016-03-14

## 2015-03-15 NOTE — Progress Notes (Signed)
Pt given results of labwork.  Verbalized understanding that prescription has been sent to pharmacy for Vitamin D.  Will wait to hear from her pap results when they come in.

## 2015-03-16 LAB — PAP IG W/ RFLX HPV ASCU: PAP SMEAR COMMENT: 0

## 2015-04-05 ENCOUNTER — Telehealth: Payer: Self-pay

## 2015-04-05 NOTE — Telephone Encounter (Signed)
Pt's husband called to set up her colonoscopy. She doesn't get off work until 430 pm and hasn't been able to call. Please call  [phone]

## 2015-04-11 NOTE — Telephone Encounter (Signed)
Doris, have you been able to call the patient's husband to schedule the patient's colonoscopy? He called last Thursday to handle this for her since she doesn't get off work until 430pm. He can be reached at [phone]

## 2015-04-17 ENCOUNTER — Telehealth: Payer: Self-pay

## 2015-04-17 NOTE — Telephone Encounter (Signed)
Pt is not ready to schedule until April. [name] on my list to call in March to schedule.

## 2015-04-17 NOTE — Telephone Encounter (Signed)
Pt would like to schedule in April. [name] aware I do not have the doctor's schedule for April yet and I will call her back in March.

## 2015-05-07 ENCOUNTER — Ambulatory Visit (INDEPENDENT_AMBULATORY_CARE_PROVIDER_SITE_OTHER): Payer: BC Managed Care – PPO | Admitting: Family Medicine

## 2015-05-07 ENCOUNTER — Encounter: Payer: Self-pay | Admitting: Family Medicine

## 2015-05-07 DIAGNOSIS — R0981 Nasal congestion: Secondary | ICD-10-CM | POA: Diagnosis not present

## 2015-05-07 DIAGNOSIS — J101 Influenza due to other identified influenza virus with other respiratory manifestations: Secondary | ICD-10-CM

## 2015-05-07 DIAGNOSIS — R6883 Chills (without fever): Secondary | ICD-10-CM | POA: Diagnosis not present

## 2015-05-07 DIAGNOSIS — R001 Bradycardia, unspecified: Secondary | ICD-10-CM | POA: Diagnosis not present

## 2015-05-07 DIAGNOSIS — R509 Fever, unspecified: Secondary | ICD-10-CM

## 2015-05-07 LAB — VERITOR FLU A/B WAIVED
INFLUENZA A: POSITIVE — AB
Influenza B: NEGATIVE

## 2015-05-07 MED ORDER — AZITHROMYCIN 250 MG PO TABS: ORAL_TABLET | ORAL | Status: DC

## 2015-05-07 NOTE — Addendum Note (Signed)
Addended by: Elenora GammaBRADSHAW, Mikki Ziff L on: 05/07/2015 11:22 AM   Modules accepted: Orders

## 2015-05-07 NOTE — Progress Notes (Addendum)
   HPI  Patient presents today Here with fever and cough.  Patient explains that over the last 4-5 days she's had cough. It began with cough, the following day she developed fever to 101. She's also had chills and nasal congestion. [name] tolerating foods and fluids easily, she has no shortness of breath. She feels that her cough is improving. 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  She has several sick contacts.  He states that her pulse is intermittently low hen she goes to behavioral health, she denies dizziness, syncope, presyncope, or shortness of breath.   PMH: Smoking status noted ROS: Per HPI  Objective: BP 126/68 mmHg  Pulse 49  Temp(Src) 97.6 F (36.4 C) (Oral)  Ht 5\' 5"  (1.651 m)  Wt 222 lb 6.4 oz (100.88 kg)  BMI 37.01 kg/m2 Gen: NAD, alert, cooperative with exam HEENT: NCAT, TMs normal bilaterally, nares clear, oropharynx clear CV: brady, no murmur Resp: nonlabored, persistent rhonchus in the right lower lung field with coarse expiratory breath sounds Ext: No edema, warm Neuro: Alert and oriented, No gross deficits  Flu A Positive  Assessment and plan:  # Influenza, concern for underlying pneumonia Tx with azithoro,  out of the window for tamiflu Tylenol, supportive care Return to work on Friday  Bradycardia Intermittent, asymptomatic EKG concerning for complete heart  Block, reviewed with cardiology, Dr. Excell Seltzerooper and Allred, on the phone, who agree it is concerning but more likely narrow complex QRS.  OK to go home, close EP f/u next week will be arranged by Cardiology.     Orders Placed This Encounter  Procedures  . Veritor Flu A/B Waived    Order Specific Question:  Source    Answer:  nose  . EKG 12-Lead     Murtis SinkSam Bradshaw, MD [facility] 05/07/2015, 9:57 AM

## 2015-05-07 NOTE — Patient Instructions (Addendum)
Great to meet you!  You have the flu, a very contagious virus I am also treating you for pneumonia because of the severe cough and the sounds I have heard in your lungs. To treat this you should take azithromycin.   Influenza, Adult Influenza ("the flu") is a viral infection of the respiratory tract. It occurs more often in winter months because people spend more time in close contact with one another. Influenza can make you feel very sick. Influenza easily spreads from person to person (contagious). CAUSES  Influenza is caused by a virus that infects the respiratory tract. You can catch the virus by breathing in droplets from an infected person's cough or sneeze. You can also catch the virus by touching something that was recently contaminated with the virus and then touching your mouth, nose, or eyes. RISKS AND COMPLICATIONS You may be at risk for a more severe case of influenza if you smoke cigarettes, have diabetes, have chronic heart disease (such as heart failure) or lung disease (such as asthma), or if you have a weakened immune system. Elderly people and pregnant women are also at risk for more serious infections. The most common problem of influenza is a lung infection (pneumonia). Sometimes, this problem can require emergency medical care and may be life threatening. SIGNS AND SYMPTOMS  Symptoms typically last 4 to 10 days and may include:  Fever.  Chills.  Headache, body aches, and muscle aches.  Sore throat.  Chest discomfort and cough.  Poor appetite.  Weakness or feeling tired.  Dizziness.  Nausea or vomiting. DIAGNOSIS  Diagnosis of influenza is often made based on your history and a physical exam. A nose or throat swab test can be done to confirm the diagnosis. TREATMENT  In mild cases, influenza goes away on its own. Treatment is directed at relieving symptoms. For more severe cases, your health care provider may prescribe antiviral medicines to shorten the  sickness. Antibiotic medicines are not effective because the infection is caused by a virus, not by bacteria. HOME CARE INSTRUCTIONS  Take medicines only as directed by your health care provider.  Use a cool mist humidifier to make breathing easier.  Get plenty of rest until your temperature returns to normal. This usually takes 3 to 4 days.  Drink enough fluid to keep your urine clear or pale yellow.  Cover yourmouth and nosewhen coughing or sneezing,and wash your handswellto prevent thevirusfrom spreading.  Stay homefromwork orschool untilthe fever is gonefor at least 351full day. PREVENTION  An annual influenza vaccination (flu shot) is the best way to avoid getting influenza. An annual flu shot is now routinely recommended for all adults in the [place] SEEK MEDICAL CARE IF:  You experiencechest pain, yourcough worsens,or you producemore mucus.  Youhave nausea,vomiting, ordiarrhea.  Your fever returns or gets worse. SEEK IMMEDIATE MEDICAL CARE IF:  You havetrouble breathing, you become short of breath,or your skin ornails becomebluish.  You have severe painor stiffnessin the neck.  You develop a sudden headache, or pain in the face or ear.  You have nausea or vomiting that you cannot control. MAKE SURE YOU:   Understand these instructions.  Will watch your condition.  Will get help right away if you are not doing well or get worse.   This information is not intended to replace advice given to you by your health care provider. Make sure you discuss any questions you have with your health care provider.   Document Released: 02/08/2000 Document Revised: 03/03/2014 Document Reviewed:  05/12/2011 Elsevier Interactive Patient Education [employer].

## 2015-05-14 ENCOUNTER — Encounter: Payer: Self-pay | Admitting: Cardiology

## 2015-05-14 ENCOUNTER — Encounter: Payer: Self-pay | Admitting: *Deleted

## 2015-05-14 ENCOUNTER — Ambulatory Visit (INDEPENDENT_AMBULATORY_CARE_PROVIDER_SITE_OTHER): Payer: BC Managed Care – PPO | Admitting: Cardiology

## 2015-05-14 VITALS — BP 126/78 | HR 42 | Ht 65.0 in | Wt 219.6 lb

## 2015-05-14 DIAGNOSIS — Z01812 Encounter for preprocedural laboratory examination: Secondary | ICD-10-CM

## 2015-05-14 DIAGNOSIS — I442 Atrioventricular block, complete: Secondary | ICD-10-CM

## 2015-05-14 LAB — CBC WITH DIFFERENTIAL/PLATELET
BASOS ABS: 0 10*3/uL (ref 0.0–0.1)
Basophils Relative: 0 % (ref 0–1)
Eosinophils Absolute: 0.1 10*3/uL (ref 0.0–0.7)
Eosinophils Relative: 1 % (ref 0–5)
HEMATOCRIT: 41.7 % (ref 36.0–46.0)
Hemoglobin: 13.9 g/dL (ref 12.0–15.0)
LYMPHS ABS: 2.6 10*3/uL (ref 0.7–4.0)
LYMPHS PCT: 28 % (ref 12–46)
MCH: 29.2 pg (ref 26.0–34.0)
MCHC: 33.3 g/dL (ref 30.0–36.0)
MCV: 87.6 fL (ref 78.0–100.0)
MPV: 11.2 fL (ref 8.6–12.4)
Monocytes Absolute: 0.7 10*3/uL (ref 0.1–1.0)
Monocytes Relative: 8 % (ref 3–12)
NEUTROS ABS: 5.9 10*3/uL (ref 1.7–7.7)
NEUTROS PCT: 63 % (ref 43–77)
Platelets: 330 10*3/uL (ref 150–400)
RBC: 4.76 MIL/uL (ref 3.87–5.11)
RDW: 13.8 % (ref 11.5–15.5)
WBC: 9.3 10*3/uL (ref 4.0–10.5)

## 2015-05-14 LAB — BASIC METABOLIC PANEL
BUN: 14 mg/dL (ref 7–25)
CHLORIDE: 106 mmol/L (ref 98–110)
CO2: 25 mmol/L (ref 20–31)
Calcium: 9.2 mg/dL (ref 8.6–10.4)
Creat: 0.92 mg/dL (ref 0.50–1.05)
Glucose, Bld: 81 mg/dL (ref 65–99)
Potassium: 4.6 mmol/L (ref 3.5–5.3)
Sodium: 141 mmol/L (ref 135–146)

## 2015-05-14 NOTE — Progress Notes (Signed)
Electrophysiology Office Note   Date:  05/14/2015   ID:  Alejandra ShuKaren G Ortman, DOB 11/08/1960, MRN [id number]  PCP:  Jannifer Rodneyhristy Hawks, FNP  Primary Electrophysiologist:  Will Jorja LoaMartin Camnitz, MD    Chief Complaint  Patient presents with  . New Patient (Initial Visit)  . Bradycardia     History of Present Illness: Alejandra Harrison is a 55 y.o. female who presents today for electrophysiology evaluation.   She has a history of anxiety and psychosis presenting with episodes of bradycardia.she does have some dizziness when she is standing and moving around.  She says that she has to sit down at times which helps with her symptoms.  She otherwise has no acute complaints.   Today, she denies symptoms of palpitations, chest pain, shortness of breath, orthopnea, PND, lower extremity edema, claudication, presyncope, syncope, bleeding, or neurologic sequela. The patient is tolerating medications without difficulties and is otherwise without complaint today.    Past Medical History  Diagnosis Date  . Anxiety   . Phlebitis   . History of seasonal allergies   . Psychosis    Past Surgical History  Procedure Laterality Date  . Tubal ligation       Current Outpatient Prescriptions  Medication Sig Dispense Refill  . ARIPiprazole (ABILIFY) 2 MG tablet Take 1/2 tab at bed time 30 tablet 3  . Vitamin D, Ergocalciferol, (DRISDOL) 50000 units CAPS capsule Take 1 capsule (50,000 Units total) by mouth every 7 (seven) days. 12 capsule 3   No current facility-administered medications for this visit.    Allergies:   Oxycodone-acetaminophen and Penicillins   Social History:  The patient  reports that she has never smoked. She has never used smokeless tobacco. She reports that she does not drink alcohol or use illicit drugs.   Family History:  The patient's family history includes Breast cancer in her paternal aunt; Dementia in her mother; Diabetes in her father. There is no history of ADD / ADHD, Alcohol abuse,  Drug abuse, Anxiety disorder, Bipolar disorder, Depression, OCD, Paranoid behavior, Schizophrenia, Seizures, Sexual abuse, or Physical abuse.    ROS:  Please see the history of present illness.   Otherwise, review of systems is positive for dizziness.   All other systems are reviewed and negative.    PHYSICAL EXAM: VS:  BP 126/78 mmHg  Pulse 42  Ht 5\' 5"  (1.651 m)  Wt 219 lb 9.6 oz (99.61 kg)  BMI 36.54 kg/m2 , BMI Body mass index is 36.54 kg/(m^2). GEN: Well nourished, well developed, in no acute distress HEENT: normal Neck: no JVD, carotid bruits, or masses Cardiac: "we'rRRR; no murmurs, rubs, or gallops,no edema  Respiratory:  clear to auscultation bilaterally, normal work of breathing GI: soft, nontender, nondistended, + BS MS: no deformity or atrophy Skin: warm and dry Neuro:  Strength and sensation are intact Psych: euthymic mood, full affect  EKG:  EKG is ordered today. The ekg ordered today shows complete heart block, junctional escape, rate 42  Recent Labs: 06/07/2014: Hemoglobin 13.2 03/14/2015: ALT 30; BUN 9; Creatinine, Ser 0.75; Platelets 295; Potassium 4.7; Sodium 140; TSH 2.730    Lipid Panel     Component Value Date/Time   CHOL 180 03/14/2015 1114   TRIG 103 03/14/2015 1114   HDL 62 03/14/2015 1114   CHOLHDL 2.9 03/14/2015 1114   LDLCALC 97 03/14/2015 1114     Wt Readings from Last 3 Encounters:  05/14/15 219 lb 9.6 oz (99.61 kg)  05/07/15 222 lb 6.4 oz (  100.88 kg)  03/14/15 219 lb 12.8 oz (99.701 kg)      ASSESSMENT AND PLAN:  1.  Complete heart block: the patient has a dizziness that could Be related to her complete heart block. She also has some fatigue. I have discussed with her the options of pacemaker placement.  Told her the risks which include bleeding, infection, tamponade, pneumothorax.  She understands these risks and has agreed to the procedure.  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    Current medicines are reviewed at length with the patient today.   The patient does not have concerns regarding her medicines.  The following changes were made today:  none  Labs/ tests ordered today include:  No orders of the defined types were placed in this encounter.     Disposition:   FU with Will Camnitz 3 months  Signed, Will Jorja Loa, MD  05/14/2015 3:42 PM     [facility] [address] [place] [address] [phone] (office) [phone] (fax)

## 2015-05-14 NOTE — Patient Instructions (Addendum)
Medication Instructions:  Your physician recommends that you continue on your current medications as directed. Please refer to the Current Medication list given to you today.  Labwork: Pre procedure lab work today: Nutritional therapist, CBCD  Testing/Procedures: Your physician has recommended that you have a pacemaker inserted. A pacemaker is a small device that is placed under the skin of your chest or abdomen to help control abnormal heart rhythms. This device uses electrical pulses to prompt the heart to beat at a normal rate. Pacemakers are used to treat heart rhythms that are too slow. Wire (leads) are attached to the pacemaker that goes into the chambers of you heart. This is done in the hospital and usually requires and overnight stay. Please see the instruction sheet given to you today for more information.  Follow-Up: Your physician recommends that you schedule a follow-up appointment in: 10-14 days, after procedure on 05/17/15, for wound check with device clinic.  Your physician recommends that you schedule a follow-up appointment in: 3 months, after procedure on 05/17/2015, with Dr. Elberta Fortis.  If you need a refill on your cardiac medications before your next appointment, please call your pharmacy.  Thank you for choosing [facility]!!   Dory Horn, RN [phone]  Any Other Special Instructions Will Be Listed Below (If Applicable). Pacemaker Implantation The heart has its own electrical system, or natural pacemaker, to regulate the heartbeat. Sometimes, the natural pacemaker system of the heart fails and causes the heart to beat too slowly. If this happens, a pacemaker can be surgically placed to help the heart beat at a normal or programmed rate. A pacemaker is a small, battery-powered device that is placed under the skin and is programmed to sense your heartbeats. If your heart rate is lower than the programmed rate, the pacemaker will pace your heart. Parts of a pacemaker include:  Wires  or leads. The leads are placed in the heart and transmit electricity to the heart. The leads are connected to the pulse generator.  Pulse generator. The pulse generator contains a computer and a memory system. The pulse generator also produces the electrical signal that triggers the heart to beat. A pacemaker may be placed if:  You have a slow heartbeat (bradycardia).  You have fainting (syncope).  Shortness of breath (dyspnea) due to heart problems. LET [facility] CARE PROVIDER KNOW ABOUT:  Any allergies you may have.  All medicines you are taking, including vitamins, herbs, eye drops, creams, and over-the-counter medicines.  Previous problems you or members of your family have had with the use of anesthetics.  Any blood disorders you have.  Previous surgeries you have had.  Medical conditions you have.  Possibility of pregnancy, if this applies. RISKS AND COMPLICATIONS Generally, pacemaker implantation is a safe procedure. However, problems can occur and include:  Bleeding.  Unable to place the pacemaker under local sedation.  Infection. BEFORE THE PROCEDURE  You will have blood work drawn before the procedure.  Do not use any tobacco products including cigarettes, chewing tobacco, or electronic cigarettes. If you need help quitting, ask your health care provider.  Do not eat or drink anything after midnight on the night before the procedure or as directed by your health care provider.  Ask your health care provider about:  Changing or stopping your regular medicines. This is especially important if you are taking diabetes medicines or blood thinners.  Taking medicines such as aspirin and ibuprofen. These medicines can thin your blood. Do not take these medicines before  your procedure if your health care provider asks you not to.  Ask your health care provider if you can take a sip of water with any approved medicines the morning of the procedure. PROCEDURE  The  surgery to place a pacemaker is considered a minimally invasive surgical procedure. It is done under a local anesthetic, which is an injection at the incision site that makes the skin numb. You are also given sedation and pain medicine that makes you drowsy during the procedure.   An intravenous line (IV) will be started in your hand or arm so sedation and pain medicine can be given during the pacemaker procedure.  A numbing medicine will be injected into the skin where the pacemaker is to be placed. A small incision will then be made into the skin. The pacemaker is usually placed under the skin near the collarbone.  After the incision has been made, the leads will be inserted into a large vein and guided into the heart using X-ray.  Using the same incision that was used to place the leads, a small pocket will be created under the skin to hold the pulse generator. The leads will then be connected to the pulse generator.  The incision site will then be closed. A bandage (dressing) is placed over the pacemaker site. The dressing is removed 24-48 hours afterward. AFTER THE PROCEDURE  You will be taken to a recovery area after the pacemaker implant. Your vital signs such as blood pressure, heart rate, breathing, and oxygen levels will be monitored.  A chest X-ray will be done after the pacemaker has been implanted. This is to make sure the pacemaker and leads are in the correct place.   This information is not intended to replace advice given to you by your health care provider. Make sure you discuss any questions you have with your health care provider.   Document Released: 01/31/2002 Document Revised: 03/03/2014 Document Reviewed: 06/17/2011 Elsevier Interactive Patient Education [employer] Elsevier Inc.

## 2015-05-17 ENCOUNTER — Encounter (HOSPITAL_COMMUNITY)
Admission: RE | Disposition: A | Discharge status: Home / Self Care | Payer: Self-pay | Source: Ambulatory Visit | Attending: Cardiology

## 2015-05-17 ENCOUNTER — Ambulatory Visit (HOSPITAL_COMMUNITY)
Admission: RE | Admit: 2015-05-17 | Discharge: 2015-05-18 | Disposition: A | Discharge status: Home / Self Care | Payer: BC Managed Care – PPO | Source: Ambulatory Visit | Attending: Cardiology | Admitting: Cardiology

## 2015-05-17 ENCOUNTER — Encounter (HOSPITAL_COMMUNITY): Payer: Self-pay | Admitting: *Deleted

## 2015-05-17 DIAGNOSIS — I459 Conduction disorder, unspecified: Secondary | ICD-10-CM | POA: Diagnosis present

## 2015-05-17 DIAGNOSIS — F5105 Insomnia due to other mental disorder: Secondary | ICD-10-CM

## 2015-05-17 DIAGNOSIS — I442 Atrioventricular block, complete: Secondary | ICD-10-CM | POA: Insufficient documentation

## 2015-05-17 DIAGNOSIS — F23 Brief psychotic disorder: Secondary | ICD-10-CM

## 2015-05-17 DIAGNOSIS — Z95818 Presence of other cardiac implants and grafts: Secondary | ICD-10-CM

## 2015-05-17 DIAGNOSIS — F419 Anxiety disorder, unspecified: Secondary | ICD-10-CM | POA: Diagnosis not present

## 2015-05-17 DIAGNOSIS — R001 Bradycardia, unspecified: Secondary | ICD-10-CM | POA: Diagnosis not present

## 2015-05-17 DIAGNOSIS — Z880 Allergy status to penicillin: Secondary | ICD-10-CM | POA: Diagnosis not present

## 2015-05-17 HISTORY — PX: INSERT / REPLACE / REMOVE PACEMAKER: SUR710

## 2015-05-17 HISTORY — PX: EP IMPLANTABLE DEVICE: SHX172B

## 2015-05-17 LAB — SURGICAL PCR SCREEN
MRSA, PCR: NEGATIVE
STAPHYLOCOCCUS AUREUS: NEGATIVE

## 2015-05-17 SURGERY — PACEMAKER IMPLANT
Anesthesia: LOCAL

## 2015-05-17 MED ORDER — MUPIROCIN 2 % EX OINT
TOPICAL_OINTMENT | Freq: Two times a day (BID) | CUTANEOUS | Status: DC
  Administered 2015-05-17 (×2): 1 via NASAL
  Filled 2015-05-17: qty 22

## 2015-05-17 MED ORDER — VANCOMYCIN HCL IN DEXTROSE 1-5 GM/200ML-% IV SOLN
1000.0000 mg | Freq: Two times a day (BID) | INTRAVENOUS | Status: AC
  Administered 2015-05-17: 1000 mg via INTRAVENOUS
  Filled 2015-05-17: qty 200

## 2015-05-17 MED ORDER — FENTANYL CITRATE (PF) 100 MCG/2ML IJ SOLN
INTRAMUSCULAR | Status: DC | PRN
  Administered 2015-05-17 (×3): 25 ug via INTRAVENOUS

## 2015-05-17 MED ORDER — MUPIROCIN 2 % EX OINT: TOPICAL_OINTMENT | Freq: Two times a day (BID) | CUTANEOUS | Status: DC

## 2015-05-17 MED ORDER — VITAMIN D (ERGOCALCIFEROL) 1.25 MG (50000 UNIT) PO CAPS: 50000.0000 [IU] | ORAL_CAPSULE | ORAL | Status: DC

## 2015-05-17 MED ORDER — SODIUM CHLORIDE 0.9 % IV SOLN
INTRAVENOUS | Status: DC
  Administered 2015-05-17: 10:00:00 via INTRAVENOUS

## 2015-05-17 MED ORDER — LIDOCAINE HCL (PF) 1 % IJ SOLN
INTRAMUSCULAR | Status: DC | PRN
Start: 2015-05-17 — End: 2015-05-17
  Administered 2015-05-17: 31 mL via INTRADERMAL

## 2015-05-17 MED ORDER — HEPARIN (PORCINE) IN NACL 2-0.9 UNIT/ML-% IJ SOLN
INTRAMUSCULAR | Status: DC | PRN
  Administered 2015-05-17: 14:00:00

## 2015-05-17 MED ORDER — SODIUM CHLORIDE 0.9 % IR SOLN: 80.0000 mg | Status: DC

## 2015-05-17 MED ORDER — LIDOCAINE HCL (PF) 1 % IJ SOLN
INTRAMUSCULAR | Status: AC
  Filled 2015-05-17: qty 60

## 2015-05-17 MED ORDER — ONDANSETRON HCL 4 MG/2ML IJ SOLN: 4.0000 mg | Freq: Four times a day (QID) | INTRAMUSCULAR | Status: DC | PRN

## 2015-05-17 MED ORDER — MIDAZOLAM HCL 5 MG/5ML IJ SOLN
INTRAMUSCULAR | Status: AC
  Filled 2015-05-17: qty 5

## 2015-05-17 MED ORDER — ARIPIPRAZOLE 2 MG PO TABS
1.0000 mg | ORAL_TABLET | Freq: Every day | ORAL | Status: DC
  Administered 2015-05-17: 1 mg via ORAL
  Filled 2015-05-17: qty 1

## 2015-05-17 MED ORDER — SODIUM CHLORIDE 0.9 % IR SOLN
Status: AC
  Filled 2015-05-17: qty 2

## 2015-05-17 MED ORDER — VANCOMYCIN HCL IN DEXTROSE 1-5 GM/200ML-% IV SOLN
1000.0000 mg | INTRAVENOUS | Status: AC
  Administered 2015-05-17: 1000 mg via INTRAVENOUS
  Filled 2015-05-17: qty 200

## 2015-05-17 MED ORDER — FENTANYL CITRATE (PF) 100 MCG/2ML IJ SOLN
INTRAMUSCULAR | Status: AC
  Filled 2015-05-17: qty 2

## 2015-05-17 MED ORDER — MIDAZOLAM HCL 5 MG/5ML IJ SOLN
INTRAMUSCULAR | Status: DC | PRN
  Administered 2015-05-17 (×4): 1 mg via INTRAVENOUS

## 2015-05-17 MED ORDER — HEPARIN (PORCINE) IN NACL 2-0.9 UNIT/ML-% IJ SOLN
INTRAMUSCULAR | Status: AC
  Filled 2015-05-17: qty 500

## 2015-05-17 MED ORDER — ACETAMINOPHEN 325 MG PO TABS
325.0000 mg | ORAL_TABLET | ORAL | Status: DC | PRN
  Administered 2015-05-17 – 2015-05-18 (×2): 650 mg via ORAL
  Filled 2015-05-17 (×2): qty 2

## 2015-05-17 MED ORDER — MUPIROCIN 2 % EX OINT
TOPICAL_OINTMENT | CUTANEOUS | Status: AC
  Administered 2015-05-17: 1 via NASAL
  Filled 2015-05-17: qty 22

## 2015-05-17 SURGICAL SUPPLY — 7 items
CABLE SURGICAL S-101-97-12 (CABLE) ×2 IMPLANT
LEAD TENDRIL MRI 46CM LPA1200M (Lead) ×2 IMPLANT
LEAD TENDRIL MRI 52CM LPA1200M (Lead) ×2 IMPLANT
PACEMAKER ASSURITY DR-RF (Pacemaker) ×2 IMPLANT
PAD DEFIB LIFELINK (PAD) ×2 IMPLANT
SHEATH CLASSIC 8F (SHEATH) ×4 IMPLANT
TRAY PACEMAKER INSERTION (PACKS) ×2 IMPLANT

## 2015-05-17 NOTE — Addendum Note (Signed)
Addended by: Reesa ChewJONES, Ayeden Gladman G on: 05/17/2015 05:59 PM   Modules accepted: Orders

## 2015-05-17 NOTE — Discharge Summary (Signed)
ELECTROPHYSIOLOGY PROCEDURE DISCHARGE SUMMARY    Patient ID: Alejandra Harrison,  MRN: [id number], DOB/AGE: 56/22/1962 56 y.o.  Admit date: 05/17/2015 Discharge date: 05/18/15  Primary Care Physician: Jannifer Rodney, FNP Primary Cardiologist/Electrophysiologist: Dr. Elberta Fortis  Primary Discharge Diagnosis:  1. CHB, s/p PPM implantation this admission  Secondary Discharge Diagnosis:  1. Anxiety, psycosis  Allergies  Allergen Reactions  . Oxycodone-Acetaminophen Hives and Other (See Comments)    Hands peeled  . Penicillins Hives and Other (See Comments)    Hands peeled     Procedures This Admission:  1.  Implantation of a STJ dual chamber PPM on 05/17/15 by Dr Elberta Fortis.  The patient received aSt Jude Medical Assurity MRI model [id number] number [id number] ) pacemaker with a [facility] model [phone] (serial number [id number]) right atrial lead and a [facility] Medical model [id number] (serial number [id number]) right ventricular lead.  There were no immediate post procedure complications. 2.  CXR on 05/18/15 demonstrated no pneumothorax status post device implantation.   Brief HPI: Alejandra Harrison is a 55 y.o. female was referred to electrophysiology in the outpatient setting for consideration of PPM implantation.  Past medical history includes CHB, anxiety, psycosis.  The patient has had symptomatic bradycardia without reversible causes identified.  Risks, benefits, and alternatives to PPM implantation were reviewed with the patient who wished to proceed.   Hospital Course:  The patient was admitted and underwent implantation of a PPM with details as outlined above.  She  was monitored on telemetry overnight which demonstrated SR/Vpaced.  Left chest was without hematoma or ecchymosis.  The device was interrogated and found to be functioning normally.  CXR was obtained and demonstrated no pneumothorax status post device implantation.  Wound care, arm mobility, and restrictions were reviewed  with the patient.  The patient was examined by Dr. Elberta Fortis and considered stable for discharge to home.   The patient mentioned a slight burning type discomfort with the start of her urine flow this morning, UA noted and is pending reflex culture, she is being sent home with Bactrim DS for 3 days regime and instructed to f/u with her PMD if symptom not resolveded or if any worsening.  She states understanding.   Physical Exam: Filed Vitals:   05/17/15 1737 05/17/15 1812 05/17/15 1925 05/18/15 0331  BP: 141/84 151/81 121/67 119/81  Pulse: 67 68 71 74  Temp: 99.1 F (37.3 C) 97.4 F (36.3 C) 99.3 F (37.4 C) 99.4 F (37.4 C)  TempSrc: Oral  Oral Oral  Resp: Height:      Weight:    217 lb 9.6 oz (98.703 kg)  SpO2: 98% 99% 95% 96%     GEN- The patient is well appearing, alert and oriented x 3 today.   HEENT: normocephalic, atraumatic; sclera clear, conjunctiva pink; hearing intact; oropharynx clear; neck supple, no JVP Lungs- Clear to ausculation bilaterally, normal work of breathing.  No wheezes, rales, rhonchi Heart- Regular rate and rhythm, no murmurs, rubs or gallops, PMI not laterally displaced GI- soft, non-tender, non-distended, bowel sounds present, no hepatosplenomegaly Extremities- no clubbing, cyanosis, or edema MS- no significant deformity or atrophy Skin- warm and dry, no rash or lesion, left chest without hematoma/ecchymosis Psych- euthymic mood, full affect Neuro- no gross deficits   Labs:   Lab Results  Component Value Date   WBC 9.3 05/14/2015   HGB 13.9 05/14/2015   HCT 41.7 05/14/2015   MCV 87.6 05/14/2015   PLT  330 05/14/2015     Recent Labs Lab 05/14/15 1607  NA 141  K 4.6  CL 106  CO2 25  BUN 14  CREATININE 0.92  CALCIUM 9.2  GLUCOSE 81    Discharge Medications:    Medication List    TAKE these medications        ARIPiprazole 2 MG tablet  Commonly known as:  ABILIFY  Take 1/2 tab at bed time      sulfamethoxazole-trimethoprim 800-160 MG tablet  Commonly known as:  BACTRIM DS,SEPTRA DS  Take 1 tablet by mouth 2 (two) times daily. To complete three days treatment     Vitamin D (Ergocalciferol) 50000 units Caps capsule  Commonly known as:  DRISDOL  Take 1 capsule (50,000 Units total) by mouth every 7 (seven) days.        Disposition: Home Discharge Instructions    Diet - low sodium heart healthy    Complete by:  As directed      Increase activity slowly    Complete by:  As directed           Follow-up Information    [facility] [address] [facility] On 05/30/2015.   Specialty:  Cardiology   Why:  10:00AM wound check   Contact information:   [address], [address] [place] [phone]      Follow up with Will Jorja LoaMartin Camnitz, MD On 08/24/2015.   Specialty:  Cardiology   Why:  11:30AM   Contact information:   [address] [place] [address] [phone]       Duration of Discharge Encounter: Greater than 30 minutes including physician time.  Norma FredricksonSigned, Renee Ursuy, PA-C 05/18/2015 11:53 AM    I have seen and examined this patient with Francis Dowseenee Ursuy.  Agree with above, note added to reflect my findings.  On exam, regular rhythm ,no murmurs, lungs clear.  Had dual chamber pacemaker placed for complete heart block.  CXR today without abnormality, device interrogation without abnormality.  Will discharge today with plan for follow up in device clinic in 10 days..    Will M. Camnitz MD 05/18/2015 9:05 PM

## 2015-05-17 NOTE — Plan of Care (Signed)
Problem: Phase III Progression Outcomes Goal: Pain controlled on oral analgesia Outcome: Completed/Met Date Met:  05/17/15 Patient has no complaints of pain other than mild discomfort at pacemaker insertion site. Patient will request Tylenol if needed.  Problem: Safety: Goal: Ability to remain free from injury will improve Patient's call bell within reach; patient has verbalized that she will call for staff assistance if she needs to get up. Patient's left arm is in a sling and patient has verbalized understanding that she should leave her arm in the sling and not move it for 24 hours post pacemaker insertion.

## 2015-05-17 NOTE — H&P (Signed)
Alejandra Harrison is was seen and examined.  On exam, bradycardic, no murmurs, lungs clear.  Presenting with complete heart block for dual chamber pacemaker.  Risks and benefits explained.  Risks include bleeding, infection, tamponade, pneumothorax.  Patient and family understand the risks and have agreed to the procedure.  Will [name], MD 05/17/2015 10:03 AM

## 2015-05-17 NOTE — Progress Notes (Signed)
Orthopedic Tech Progress Note Patient Details:  Alejandra Harrison 09/30/1960 [id number] Patient already has arm sling. Patient ID: Alejandra Harrison, female   DOB: 01/26/1961, 55 y.o.   MRN: [id number]   Alejandra Harrison, Alejandra Harrison 05/17/2015, 9:38 PM

## 2015-05-17 NOTE — Progress Notes (Addendum)
Pt exhibits redened/flushed areas on her face on arrival to cath holding. Vancomycin complete and stopped. No other symptoms present. Dr [name] in to see Pt. Much less pronounced 10 to 15 min post arrival.

## 2015-05-18 ENCOUNTER — Encounter (HOSPITAL_COMMUNITY): Payer: Self-pay | Admitting: Cardiology

## 2015-05-18 ENCOUNTER — Ambulatory Visit (HOSPITAL_COMMUNITY): Payer: BC Managed Care – PPO

## 2015-05-18 DIAGNOSIS — I442 Atrioventricular block, complete: Secondary | ICD-10-CM | POA: Diagnosis not present

## 2015-05-18 DIAGNOSIS — F419 Anxiety disorder, unspecified: Secondary | ICD-10-CM | POA: Diagnosis not present

## 2015-05-18 DIAGNOSIS — Z880 Allergy status to penicillin: Secondary | ICD-10-CM | POA: Diagnosis not present

## 2015-05-18 DIAGNOSIS — R001 Bradycardia, unspecified: Secondary | ICD-10-CM | POA: Diagnosis not present

## 2015-05-18 LAB — URINALYSIS, ROUTINE W REFLEX MICROSCOPIC
BILIRUBIN URINE: NEGATIVE
Glucose, UA: NEGATIVE mg/dL
HGB URINE DIPSTICK: NEGATIVE
KETONES UR: NEGATIVE mg/dL
Nitrite: NEGATIVE
PH: 6.5 (ref 5.0–8.0)
Protein, ur: NEGATIVE mg/dL
SPECIFIC GRAVITY, URINE: 1.006 (ref 1.005–1.030)

## 2015-05-18 LAB — URINE MICROSCOPIC-ADD ON: RBC / HPF: NONE SEEN RBC/hpf (ref 0–5)

## 2015-05-18 MED ORDER — SULFAMETHOXAZOLE-TRIMETHOPRIM 800-160 MG PO TABS: 1.0000 | ORAL_TABLET | Freq: Two times a day (BID) | ORAL | Status: DC

## 2015-05-18 MED FILL — Gentamicin Sulfate Inj 40 MG/ML: INTRAMUSCULAR | Qty: 2 | Status: AC

## 2015-05-18 MED FILL — Sodium Chloride Irrigation Soln 0.9%: Qty: 500 | Status: AC

## 2015-05-18 NOTE — Discharge Instructions (Signed)
° ° °  Supplemental Discharge Instructions for  Pacemaker/Defibrillator Patients  Activity No heavy lifting or vigorous activity with your left/right arm for 6 to 8 weeks.  Do not raise your left/right arm above your head for one week.  Gradually raise your affected arm as drawn below.             05/21/15                     05/22/15                    05/23/15                   05/24/15 __  NO DRIVING for  1 week   ; you may begin driving on  1/61/093/30/17   .  WOUND CARE - Keep the wound area clean and dry.  Do not get this area wet for one week. No showers for one week; you may shower on  05/24/15   . - The tape/steri-strips on your wound will fall off; do not pull them off.  No bandage is needed on the site.  DO  NOT apply any creams, oils, or ointments to the wound area. - If you notice any drainage or discharge from the wound, any swelling or bruising at the site, or you develop a fever > 101? F after you are discharged home, call the office at once.  Special Instructions - You are still able to use cellular telephones; use the ear opposite the side where you have your pacemaker/defibrillator.  Avoid carrying your cellular phone near your device. - When traveling through airports, show security personnel your identification card to avoid being screened in the metal detectors.  Ask the security personnel to use the hand wand. - Avoid arc welding equipment, MRI testing (magnetic resonance imaging), TENS units (transcutaneous nerve stimulators).  Call the office for questions about other devices. - Avoid electrical appliances that are in poor condition or are not properly grounded. - Microwave ovens are safe to be near or to operate.  Additional information for defibrillator patients should your device go off: - If your device goes off ONCE and you feel fine afterward, notify the device clinic nurses. - If your device goes off ONCE and you do not feel well afterward, call 911. - If your device  goes off TWICE, call 911. - If your device goes off THREE times in one day, call 911.  DO NOT DRIVE YOURSELF OR A FAMILY MEMBER WITH A DEFIBRILLATOR TO THE HOSPITAL--CALL 911.     If your urinary symptom does not resolve or if any worsening, follow up with your PMD for further evaluation and management.

## 2015-05-19 ENCOUNTER — Telehealth: Payer: Self-pay | Admitting: Physician Assistant

## 2015-05-19 NOTE — Telephone Encounter (Signed)
    Patient paged on call provider about some mild swelling around pacer site (pacemaker placement 3//17). No bruising or tenderness. No erythema or warmth or oozing from site. No fevers or chills. I told her to keep an eye on it. She has a wound care appt 05/30/15. If it gets worse or she develops any of the above sx she can call the office on Monday to be seen sooner or go to urgent care/ER. [name] in agreement with this plan.   Cline CrockKathryn Thompson PA-C  [facility]

## 2015-05-30 ENCOUNTER — Encounter: Payer: Self-pay | Admitting: *Deleted

## 2015-05-30 ENCOUNTER — Encounter: Payer: Self-pay | Admitting: Cardiology

## 2015-05-30 ENCOUNTER — Ambulatory Visit (INDEPENDENT_AMBULATORY_CARE_PROVIDER_SITE_OTHER): Payer: BC Managed Care – PPO | Admitting: *Deleted

## 2015-05-30 DIAGNOSIS — I442 Atrioventricular block, complete: Secondary | ICD-10-CM | POA: Diagnosis not present

## 2015-05-30 LAB — CUP PACEART INCLINIC DEVICE CHECK
Battery Voltage: 3.01 V
Brady Statistic RA Percent Paced: 1 %
Implantable Lead Implant Date: 20170323
Implantable Lead Implant Date: 20170323
Implantable Lead Location: 753860
Lead Channel Pacing Threshold Amplitude: 0.5 V
Lead Channel Pacing Threshold Amplitude: 0.5 V
Lead Channel Pacing Threshold Pulse Width: 0.4 ms
Lead Channel Pacing Threshold Pulse Width: 0.4 ms
Lead Channel Sensing Intrinsic Amplitude: 4 mV
Lead Channel Setting Pacing Pulse Width: 0.4 ms
MDC IDC LEAD LOCATION: 753859
MDC IDC MSMT BATTERY REMAINING LONGEVITY: 134.4
MDC IDC MSMT LEADCHNL RA IMPEDANCE VALUE: 525 Ohm
MDC IDC MSMT LEADCHNL RA PACING THRESHOLD AMPLITUDE: 0.5 V
MDC IDC MSMT LEADCHNL RA PACING THRESHOLD PULSEWIDTH: 0.4 ms
MDC IDC MSMT LEADCHNL RV IMPEDANCE VALUE: 587.5 Ohm
MDC IDC MSMT LEADCHNL RV PACING THRESHOLD AMPLITUDE: 0.5 V
MDC IDC MSMT LEADCHNL RV PACING THRESHOLD PULSEWIDTH: 0.4 ms
MDC IDC MSMT LEADCHNL RV SENSING INTR AMPL: 12 mV
MDC IDC SESS DTM: 20170405124940
MDC IDC SET LEADCHNL RA PACING AMPLITUDE: 3.5 V
MDC IDC SET LEADCHNL RV PACING AMPLITUDE: 0.625
MDC IDC SET LEADCHNL RV SENSING SENSITIVITY: 4 mV
MDC IDC STAT BRADY RV PERCENT PACED: 98 %
Pulse Gen Serial Number: 3134337

## 2015-05-30 NOTE — Progress Notes (Signed)
Wound check appointment. Steri-strips removed. Wound without redness or edema. Incision edges approximated, wound well healed. Normal device function. Thresholds, sensing, and impedances consistent with implant measurements. Device programmed at 3.5V/auto capture programmed on for extra safety margin until 3 month visit. Histogram distribution appropriate for patient and level of activity. No mode switches or high ventricular rates noted. Patient educated about wound care, arm mobility, lifting restrictions. ROV with WC 08/24/15.

## 2015-05-31 ENCOUNTER — Encounter: Payer: Self-pay | Admitting: *Deleted

## 2015-06-04 ENCOUNTER — Ambulatory Visit (INDEPENDENT_AMBULATORY_CARE_PROVIDER_SITE_OTHER): Payer: BC Managed Care – PPO | Admitting: Psychiatry

## 2015-06-04 ENCOUNTER — Encounter (HOSPITAL_COMMUNITY): Payer: Self-pay | Admitting: Psychiatry

## 2015-06-04 VITALS — BP 124/94 | HR 72 | Ht 65.0 in | Wt 220.6 lb

## 2015-06-04 DIAGNOSIS — F5105 Insomnia due to other mental disorder: Secondary | ICD-10-CM

## 2015-06-04 DIAGNOSIS — F23 Brief psychotic disorder: Secondary | ICD-10-CM | POA: Diagnosis not present

## 2015-06-04 DIAGNOSIS — F489 Nonpsychotic mental disorder, unspecified: Secondary | ICD-10-CM | POA: Diagnosis not present

## 2015-06-04 MED ORDER — ARIPIPRAZOLE 2 MG PO TABS: ORAL_TABLET | ORAL | Status: DC

## 2015-06-04 NOTE — Progress Notes (Signed)
Patient ID: Alejandra Harrison, female   DOB: [birth year], 55 y.o.   MRN: [id number] Patient ID: Alejandra Harrison, female   DOB: [birth year], 55 y.o.   MRN: [id number] Patient ID: Alejandra Harrison, female   DOB: [birth year], 54 y.o.   MRN: [id number] Patient ID: Alejandra Harrison, female   DOB: 1960-11-29, 55 y.o.   MRN: [id number] Patient ID: Alejandra Harrison, female   DOB: August 13, 1960, 55 y.o.   MRN: [id number] Patient ID: Alejandra Harrison, female   DOB: 1960/08/22, 55 y.o.   MRN: [id number] Patient ID: Alejandra Harrison, female   DOB: 05-04-1960, 55 y.o.   MRN: [id number] Patient ID: Alejandra Harrison, female   DOB: 01/14/1961, 55 y.o.   MRN: [id number] Patient ID: Alejandra Harrison, female   DOB: February 13, 1961, 55 y.o.   MRN: [id number] [place] [id number] Progress Note Alejandra Harrison MRN: [id number] DOB: 1960/11/18 Age: 55 y.o.  Date: 06/04/2015  Chief Complaint  Patient presents with  . Hallucinations  . Follow-up   History of present illness. As patient is a 55 year old married white female who lives with her husband and 2 sons ages 20 and 91 in 27. 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  The patient states that in 2012 she started seeing a figure that she thought may have been God. She also got fixated on a man in her Sunday school and thought that her husband would die and she would have to marry this man. She was having lots of religious visions. She claims that she had not been on any medication or using drugs or alcohol at that time. She had not had a head injury or any additional stress. She may of been going through menopause. She denied visual hallucinations but claims she felt "high and on top of the world." She's never had a history of depression however.  At any rate she was seen here by Dr. Toy Cookey and started on Abilify. She was on a higher dose such as 5 mg in the past and it caused akathisia. Currently she takes 1 mg per day and it seems to be working fairly well. She's no longer having visions or unusual  thoughts and she's functioning well in her job. She's not having significant side effects  The patient returns after 3 months. Last time she was here she states that she felt lightheaded and her pulse was a little bit slow. She was eventually evaluated at [facility] family medicine and found to have complete heart block on EKG. She was seen by cardiology and had a pacemaker implanted on 05/17/2015. She went through the procedure without difficulty and has tolerated the pacemaker well. She stated that it was somewhat upsetting to find out about her cardiac problem but she is handling it without difficulty. She denies being depressed or having any unusual thoughts or visions. [name] not been hyperactive or agitated  Allergies: Allergies  Allergen Reactions  . Oxycodone-Acetaminophen Hives and Other (See Comments)    Hands peeled  . Penicillins Hives and Other (See Comments)    Hands peeled   Medical History: Past Medical History  Diagnosis Date  . Anxiety   . History of seasonal allergies   . Psychosis    Surgical History: Past Surgical History  Procedure Laterality Date  . Tubal ligation    . Insert / replace / remove pacemaker  05/17/2015  . Ep implantable device N/A 05/17/2015    Procedure:  Pacemaker Implant;  Surgeon: Will Jorja Loa, MD;  Location: [facility];  Service: Cardiovascular;  Laterality: N/A;     ROS     Current Medications: Current Outpatient Prescriptions  Medication Sig Dispense Refill  . ARIPiprazole (ABILIFY) 2 MG tablet Take 1/2 tab at bed time 30 tablet 3  . Vitamin D, Ergocalciferol, (DRISDOL) 50000 units CAPS capsule Take 1 capsule (50,000 Units total) by mouth every 7 (seven) days. 12 capsule 3   No current facility-administered medications for this visit.   Lab Results:  Results for orders placed or performed in visit on 05/30/15 (from the past 8736 hour(s))  Implantable device check   Collection Time: 05/30/15  1:44 PM  Result  Value Ref Range   Date Time Interrogation Session [id number]    Pulse Generator Manufacturer [employer]    Pulse Gen Model 2272 Assurity MRI    Pulse Gen Serial Number [id number]    Implantable Pulse Generator Type Implantable Pulse Generator    Implantable Pulse Generator Implant Date [id number]    Implantable Lead Manufacturer [facility]    Implantable Lead Model Tendril MRI [id number]    Implantable Lead Serial Number [id number]    Implantable Lead Implant Date [id number]    Implantable Lead Location [id number]    Implantable Lead Manufacturer [facility]    Implantable Lead Model Tendril MRI [id number]    Implantable Lead Serial Number [id number]    Implantable Lead Implant Date 41125932    Implantable Lead Location [id number]    Lead Channel Setting Sensing Sensitivity 4.0 mV   Lead Channel Setting Pacing Amplitude 3.5 V   Lead Channel Setting Pacing Pulse Width 0.4 ms   Lead Channel Setting Pacing Amplitude 0.625    Lead Channel Impedance Value 525.0 ohm   Lead Channel Sensing Intrinsic Amplitude 4.0 mV   Lead Channel Pacing Threshold Amplitude 0.5 V   Lead Channel Pacing Threshold Pulse Width 0.4 ms   Lead Channel Pacing Threshold Amplitude 0.5 V   Lead Channel Pacing Threshold Pulse Width 0.4 ms   Lead Channel Impedance Value 587.5 ohm   Lead Channel Sensing Intrinsic Amplitude 12.0 mV   Lead Channel Pacing Threshold Amplitude 0.5 V   Lead Channel Pacing Threshold Pulse Width 0.4 ms   Lead Channel Pacing Threshold Amplitude 0.5 V   Lead Channel Pacing Threshold Pulse Width 0.4 ms   Battery Status Unknown    Battery Remaining Longevity 134.4    Battery Voltage 3.01 V   Brady Statistic RA Percent Paced 1.0 %   Brady Statistic RV Percent Paced 98.0 %   Eval Rhythm CHB   Results for orders placed or performed during the hospital encounter of 05/17/15 (from the past 8736 hour(s))  Surgical pcr screen   Collection Time: 05/17/15 10:33 AM  Result Value Ref Range   MRSA, PCR NEGATIVE NEGATIVE    Staphylococcus aureus NEGATIVE NEGATIVE  Urinalysis, Routine w reflex microscopic (not at [facility])   Collection Time: 05/18/15  8:39 AM  Result Value Ref Range   Color, Urine YELLOW YELLOW   APPearance CLEAR CLEAR   Specific Gravity, Urine 1.006 1.005 - 1.030   pH 6.5 5.0 - 8.0   Glucose, UA NEGATIVE NEGATIVE mg/dL   Hgb urine dipstick NEGATIVE NEGATIVE   Bilirubin Urine NEGATIVE NEGATIVE   Ketones, ur NEGATIVE NEGATIVE mg/dL   Protein, ur NEGATIVE NEGATIVE mg/dL   Nitrite NEGATIVE NEGATIVE   Leukocytes, UA TRACE (A) NEGATIVE  Urine microscopic-add on   Collection Time: 05/18/15  8:39 AM  Result Value Ref Range   Squamous Epithelial / LPF 0-5 (A) NONE SEEN   WBC, UA 0-5 0 - 5 WBC/hpf   RBC / HPF NONE SEEN 0 - 5 RBC/hpf   Bacteria, UA RARE (A) NONE SEEN  Results for orders placed or performed in visit on 05/14/15 (from the past 8736 hour(s))  Basic metabolic panel   Collection Time: 05/14/15  4:07 PM  Result Value Ref Range   Sodium 141 135 - 146 mmol/L   Potassium 4.6 3.5 - 5.3 mmol/L   Chloride 106 98 - 110 mmol/L   CO2 25 20 - 31 mmol/L   Glucose, Bld 81 65 - 99 mg/dL   BUN 14 7 - 25 mg/dL   Creat 0.92 0.50 - 1.05 mg/dL   Calcium 9.2 8.6 - 10.4 mg/dL  CBC w/Diff   Collection Time: 05/14/15  4:07 PM  Result Value Ref Range   WBC 9.3 4.0 - 10.5 K/uL   RBC 4.76 3.87 - 5.11 MIL/uL   Hemoglobin 13.9 12.0 - 15.0 g/dL   HCT 41.7 36.0 - 46.0 %   MCV 87.6 78.0 - 100.0 fL   MCH 29.2 26.0 - 34.0 pg   MCHC 33.3 30.0 - 36.0 g/dL   RDW 13.8 11.5 - 15.5 %   Platelets 330 150 - 400 K/uL   MPV 11.2 8.6 - 12.4 fL   Neutrophils Relative % 63 43 - 77 %   Neutro Abs 5.9 1.7 - 7.7 K/uL   Lymphocytes Relative 28 12 - 46 %   Lymphs Abs 2.6 0.7 - 4.0 K/uL   Monocytes Relative 8 3 - 12 %   Monocytes Absolute 0.7 0.1 - 1.0 K/uL   Eosinophils Relative 1 0 - 5 %   Eosinophils Absolute 0.1 0.0 - 0.7 K/uL   Basophils Relative 0 0 - 1 %   Basophils Absolute 0.0 0.0 - 0.1 K/uL   Smear Review  Criteria for review not met   Results for orders placed or performed in visit on 05/07/15 (from the past 8736 hour(s))  Veritor Flu A/B Waived   Collection Time: 05/07/15  9:52 AM  Result Value Ref Range   Influenza A Positive (A) Negative   Influenza B Negative Negative  Results for orders placed or performed in visit on 03/14/15 (from the past 8736 hour(s))  POCT urinalysis dipstick   Collection Time: 03/14/15 10:31 AM  Result Value Ref Range   Color, UA gold    Clarity, UA clear    Glucose, UA neg    Bilirubin, UA neg'    Ketones, UA neg    Spec Grav, UA 1.010    Blood, UA large    pH, UA 5.0    Protein, UA neg    Urobilinogen, UA negative    Nitrite, UA neg    Leukocytes, UA moderate (2+) (A) Negative  POCT UA - Microscopic Only   Collection Time: 03/14/15 10:32 AM  Result Value Ref Range   WBC, Ur, HPF, POC 1-3    RBC, urine, microscopic 5-10    Bacteria, U Microscopic occ    Mucus, UA neg    Epithelial cells, urine per micros mod    Crystals, Ur, HPF, POC neg    Casts, Ur, LPF, POC neg    Yeast, UA neg   Pap IG w/ reflex to HPV when ASC-U   Collection Time: 03/14/15 11:09 AM  Result Value Ref Range   DIAGNOSIS: Comment    Specimen adequacy: Comment  CLINICIAN PROVIDED ICD10: Comment    Performed by: Comment    PAP SMEAR COMMENT .    Note: Comment    Test Methodology Comment    PAP REFLEX: Comment   Anemia Profile B   Collection Time: 03/14/15 11:14 AM  Result Value Ref Range   Total Iron Binding Capacity 311 250 - 450 ug/dL   UIBC 254 131 - 425 ug/dL   Iron 57 27 - 159 ug/dL   Iron Saturation 18 15 - 55 %   Ferritin 205 (H) 15 - 150 ng/mL   Vitamin B-12 215 211 - 946 pg/mL   Folate 14.1 >3.0 ng/mL   WBC 7.7 3.4 - 10.8 x10E3/uL   RBC 4.66 3.77 - 5.28 x10E6/uL   Hemoglobin 14.3 11.1 - 15.9 g/dL   Hematocrit 41.3 34.0 - 46.6 %   MCV 89 79 - 97 fL   MCH 30.7 26.6 - 33.0 pg   MCHC 34.6 31.5 - 35.7 g/dL   RDW 14.1 12.3 - 15.4 %   Platelets 295 150 -  379 x10E3/uL   Neutrophils 72 %   Lymphs 21 %   Monocytes 5 %   Eos 1 %   Basos 1 %   Neutrophils Absolute 5.5 1.4 - 7.0 x10E3/uL   Lymphocytes Absolute 1.6 0.7 - 3.1 x10E3/uL   Monocytes Absolute 0.4 0.1 - 0.9 x10E3/uL   EOS (ABSOLUTE) 0.1 0.0 - 0.4 x10E3/uL   Basophils Absolute 0.1 0.0 - 0.2 x10E3/uL   Immature Granulocytes 0 %   Immature Grans (Abs) 0.0 0.0 - 0.1 x10E3/uL   Retic Ct Pct 1.6 0.6 - 2.6 %  CMP14+EGFR   Collection Time: 03/14/15 11:14 AM  Result Value Ref Range   Glucose 80 65 - 99 mg/dL   BUN 9 6 - 24 mg/dL   Creatinine, Ser 0.75 0.57 - 1.00 mg/dL   GFR calc non Af Amer 90 >59 mL/min/1.73   GFR calc Af Amer 104 >59 mL/min/1.73   BUN/Creatinine Ratio 12 9 - 23   Sodium 140 134 - 144 mmol/L   Potassium 4.7 3.5 - 5.2 mmol/L   Chloride 101 96 - 106 mmol/L   CO2 21 18 - 29 mmol/L   Calcium 9.6 8.7 - 10.2 mg/dL   Total Protein 6.9 6.0 - 8.5 g/dL   Albumin 4.5 3.5 - 5.5 g/dL   Globulin, Total 2.4 1.5 - 4.5 g/dL   Albumin/Globulin Ratio 1.9 1.1 - 2.5   Bilirubin Total 0.4 0.0 - 1.2 mg/dL   Alkaline Phosphatase 78 39 - 117 IU/L   AST 22 0 - 40 IU/L   ALT 30 0 - 32 IU/L  Hepatitis C antibody   Collection Time: 03/14/15 11:14 AM  Result Value Ref Range   Hep C Virus Ab <0.1 0.0 - 0.9 s/co ratio  VITAMIN D 25 Hydroxy (Vit-D Deficiency, Fractures)   Collection Time: 03/14/15 11:14 AM  Result Value Ref Range   Vit D, 25-Hydroxy 10.8 (L) 30.0 - 100.0 ng/mL  Thyroid Panel With TSH   Collection Time: 03/14/15 11:14 AM  Result Value Ref Range   TSH 2.730 0.450 - 4.500 uIU/mL   T4, Total 11.0 4.5 - 12.0 ug/dL   T3 Uptake Ratio 24 24 - 39 %   Free Thyroxine Index 2.6 1.2 - 4.9  Lipid panel   Collection Time: 03/14/15 11:14 AM  Result Value Ref Range   Cholesterol, Total 180 100 - 199 mg/dL   Triglycerides 103 0 - 149 mg/dL  HDL 62 >39 mg/dL   VLDL Cholesterol Cal 21 5 - 40 mg/dL   LDL Calculated 97 0 - 99 mg/dL   Chol/HDL Ratio 2.9 0.0 - 4.4 ratio units   Results for orders placed or performed in visit on 06/07/14 (from the past 8736 hour(s))  POCT CBC   Collection Time: 06/07/14  5:41 PM  Result Value Ref Range   WBC 10.8 (A) 4.6 - 10.2 K/uL   Lymph, poc 2.6 0.6 - 3.4   POC LYMPH PERCENT 24.4 10 - 50 %L   POC Granulocyte 7.3 (A) 2 - 6.9   Granulocyte percent 67.7 37 - 80 %G   RBC 4.77 4.04 - 5.48 M/uL   Hemoglobin 13.2 12.2 - 16.2 g/dL   HCT, POC 42.4 37.7 - 47.9 %   MCV 88.9 80 - 97 fL   MCH, POC 27.6 27 - 31.2 pg   MCHC 31.1 (A) 31.8 - 35.4 g/dL   RDW, POC 14.1 %   Platelet Count, POC 305.0 142 - 424 K/uL   MPV 8.9 0 - 99.8 fL   Family doctor did blood work last year and it all came back good.  Mental status examination Patient is obese female who is casually dressed and fairly groomed.  She appears to be her stated age.  Her speech is clear and coherent.  Her thought process is  logical.  She denies any active or passive suicidal thoughts homicidal thought.  At this time she denies any visual or auditory hallucination.  She described her mood as good and her affect is mood appropriate.   There were no flight of ideas or any loose association.  Her attention concentration is fair.  [name] oriented x3.  There were no tremors or shakes.  Her insight judgment and impulse control is good.  Diagnosis:   Axis I: Psychotic Disorder NOS Axis II: Deferred Axis III:  Past Medical History  Diagnosis Date  . Anxiety   . Phlebitis   . History of seasonal allergies   . Psychosis    Axis IV: other psychosocial or environmental problems Axis V: 51-60 moderate symptoms  Plan: I will continue Abilify 1 mg at bedtime to prevent recurrence of psychosis  Patient does not want to try higher dose.  [name] much comfortable with the current dose.  She does not have any tremors or shakes.  Recommend to call us back if she has any question or concern.  . She will Follow-up with [place] in 4 months  MEDICATIONS this encounter: Meds ordered this  encounter  Medications  . ARIPiprazole (ABILIFY) 2 MG tablet    Sig: Take 1/2 tab at bed time    Dispense:  30 tablet    Refill:  3   Medical Decision Making Problem Points:  Established problem, stable/improving (1), Review of last therapy session (1) and Review of psycho-social stressors (1) Data Points:  Review or order clinical lab tests (1) Review of medication regiment & side effects (2)   Amanie Mcculley, MD

## 2015-06-27 ENCOUNTER — Telehealth: Payer: Self-pay | Admitting: *Deleted

## 2015-06-27 ENCOUNTER — Encounter: Payer: Self-pay | Admitting: *Deleted

## 2015-06-27 NOTE — Telephone Encounter (Signed)
Alejandra Harrison  BMarcelle Smilingaird LyonsSherri L Jaston Havens, RN     Phone Number: [phone]            Hey,   Patient called in asking for note she needs for her job.  Needs to say she has no Physical Activity Restriction's. So we can get it faxed to her job so she can return to work Friday.   Please call her.

## 2015-06-27 NOTE — Telephone Encounter (Signed)
Informed patient that I would fax a no restrictions, starting on 5/5, work note . She was asking that it say she can return to work on 5/5.  Explained that we did not put patient out of work until 5/5, but we only restricted her physical activity levels post procedure. She seems slightly confused over this and I spent about 5-10 minutes trying to explain/clarify situation.   Will fax note to [employer] at [phone]. Patient verbalized understanding.

## 2015-06-29 ENCOUNTER — Encounter: Payer: Self-pay | Admitting: Cardiology

## 2015-08-08 ENCOUNTER — Telehealth: Payer: Self-pay

## 2015-08-08 NOTE — Telephone Encounter (Signed)
Pt referred in Jan 2017 for screening colonoscopy. Has since had a pacemaker put in. She has hard stools sometimes early in the morning that cause her to have some rectal bleeding. I have scheduled her an OV with Wynne DustEric Gill, NP for 09/06/2015 at 9:30 AM.

## 2015-08-24 ENCOUNTER — Encounter: Payer: Self-pay | Admitting: Cardiology

## 2015-08-26 NOTE — Progress Notes (Signed)
Electrophysiology Office Note   Date:  08/27/2015   ID:  Alejandra ShuKaren G Fleischhacker, DOB 12/18/1960, MRN [id number]  PCP:  Jannifer Rodneyhristy Hawks, FNP  Primary Electrophysiologist:  Devora Tortorella Jorja LoaMartin Deserea Bordley, MD    Chief Complaint  Patient presents with  . Follow-up    3 months     History of Present Illness: Alejandra Harrison is a 55 y.o. female who presents today for electrophysiology evaluation.   Found to be in complete AV block with pacemaker placed 05/17/15.  Today, she denies symptoms of palpitations, chest pain, shortness of breath, orthopnea, PND, lower extremity edema, claudication, presyncope, syncope, bleeding, or neurologic sequela. The patient is tolerating medications without difficulties and is otherwise without complaint today.    Past Medical History  Diagnosis Date  . Anxiety   . History of seasonal allergies   . Psychosis    Past Surgical History  Procedure Laterality Date  . Tubal ligation    . Insert / replace / remove pacemaker  05/17/2015  . Ep implantable device N/A 05/17/2015    Procedure: Pacemaker Implant;  Surgeon: Andree Heeg Jorja LoaMartin Anahi Belmar, MD;  Location: [facility];  Service: Cardiovascular;  Laterality: N/A;     Current Outpatient Prescriptions  Medication Sig Dispense Refill  . ARIPiprazole (ABILIFY) 2 MG tablet Take 1/2 tab at bed time 30 tablet 3  . Vitamin D, Ergocalciferol, (DRISDOL) 50000 units CAPS capsule Take 1 capsule (50,000 Units total) by mouth every 7 (seven) days. 12 capsule 3   No current facility-administered medications for this visit.    Allergies:   Oxycodone-acetaminophen and Penicillins   Social History:  The patient  reports that she has never smoked. She has never used smokeless tobacco. She reports that she does not drink alcohol or use illicit drugs.   Family History:  The patient's family history includes Breast cancer in her paternal aunt; Dementia in her mother; Diabetes in her father. There is no history of ADD / ADHD, Alcohol abuse, Drug  abuse, Anxiety disorder, Bipolar disorder, Depression, OCD, Paranoid behavior, Schizophrenia, Seizures, Sexual abuse, or Physical abuse.    ROS:  Please see the history of present illness.   Otherwise, review of systems is negative.   All other systems are reviewed and negative.    PHYSICAL EXAM: VS:  BP 122/84 mmHg  Pulse 71  Ht 5\' 5"  (1.651 m)  Wt 228 lb 12.8 oz (103.783 kg)  BMI 38.07 kg/m2 , BMI Body mass index is 38.07 kg/(m^2). GEN: Well nourished, well developed, in no acute distress HEENT: normal Neck: no JVD, carotid bruits, or masses Cardiac: RRR; no murmurs, rubs, or gallops,no edema  Respiratory:  clear to auscultation bilaterally, normal work of breathing GI: soft, nontender, nondistended, + BS MS: no deformity or atrophy Skin: warm and dry, device site well healed Neuro:  Strength and sensation are intact Psych: euthymic mood, full affect  EKG:  EKG is ordered today. The ekg ordered today shows sinus rhythm, rate 71, V paced  Device interrogation performed today.  Results in P[employer]Art  Recent Labs: 03/14/2015: ALT 30; TSH 2.730 05/14/2015: BUN 14; Creat 0.92; Hemoglobin 13.9; Platelets 330; Potassium 4.6; Sodium 141    Lipid Panel     Component Value Date/Time   CHOL 180 03/14/2015 1114   TRIG 103 03/14/2015 1114   HDL 62 03/14/2015 1114   CHOLHDL 2.9 03/14/2015 1114   LDLCALC 97 03/14/2015 1114     Wt Readings from Last 3 Encounters:  08/27/15 228 lb 12.8 oz (  103.783 kg)  06/04/15 220 lb 9.6 oz (100.064 kg)  05/18/15 217 lb 9.6 oz (98.703 kg)      ASSESSMENT AND PLAN:  1.  Complete heart block: pacemaker placed 05/17/15. Feeling well today not having any issues with her pacemaker. No major changes made today. We'll continue to monitor with follow-up in 9 months.     Current med no medicines are reviewed at length with the patient today.   The patient does not have concerns regarding her medicines.  The following changes were made today:   none  Labs/ tests ordered today include:  No orders of the defined types were placed in this encounter.     Disposition:   FU with Ladajah Soltys 9  months  Signed, Raguel Kosloski Jorja LoaMartin Rekisha Welling, MD  08/27/2015 9:33 AM     [facility] [address] [place] [address] [phone] (office) [phone] (fax)

## 2015-08-27 ENCOUNTER — Ambulatory Visit (INDEPENDENT_AMBULATORY_CARE_PROVIDER_SITE_OTHER): Payer: BC Managed Care – PPO | Admitting: Cardiology

## 2015-08-27 ENCOUNTER — Encounter: Payer: Self-pay | Admitting: Cardiology

## 2015-08-27 VITALS — BP 122/84 | HR 71 | Ht 65.0 in | Wt 228.8 lb

## 2015-08-27 DIAGNOSIS — I442 Atrioventricular block, complete: Secondary | ICD-10-CM

## 2015-08-27 LAB — CUP PACEART INCLINIC DEVICE CHECK
Battery Remaining Longevity: 129.6
Brady Statistic RV Percent Paced: 96 %
Implantable Lead Implant Date: 20170323
Implantable Lead Implant Date: 20170323
Implantable Lead Location: 753859
Implantable Lead Location: 753860
Lead Channel Impedance Value: 587.5 Ohm
Lead Channel Pacing Threshold Amplitude: 0.5 V
Lead Channel Pacing Threshold Pulse Width: 0.4 ms
Lead Channel Setting Pacing Amplitude: 2 V
Lead Channel Setting Pacing Pulse Width: 0.4 ms
Lead Channel Setting Sensing Sensitivity: 4 mV
MDC IDC MSMT BATTERY VOLTAGE: 2.99 V
MDC IDC MSMT LEADCHNL RA IMPEDANCE VALUE: 475 Ohm
MDC IDC MSMT LEADCHNL RA PACING THRESHOLD AMPLITUDE: 0.5 V
MDC IDC MSMT LEADCHNL RA PACING THRESHOLD PULSEWIDTH: 0.4 ms
MDC IDC MSMT LEADCHNL RA SENSING INTR AMPL: 3.8 mV
MDC IDC MSMT LEADCHNL RV PACING THRESHOLD AMPLITUDE: 0.5 V
MDC IDC MSMT LEADCHNL RV PACING THRESHOLD AMPLITUDE: 0.5 V
MDC IDC MSMT LEADCHNL RV PACING THRESHOLD PULSEWIDTH: 0.4 ms
MDC IDC MSMT LEADCHNL RV PACING THRESHOLD PULSEWIDTH: 0.4 ms
MDC IDC PG SERIAL: 3134337
MDC IDC SESS DTM: 20170703100006
MDC IDC SET LEADCHNL RV PACING AMPLITUDE: 0.75 V
MDC IDC STAT BRADY RA PERCENT PACED: 0.75 %
Pulse Gen Model: 2272

## 2015-08-27 NOTE — Patient Instructions (Signed)
Medication Instructions:  Your physician recommends that you continue on your current medications as directed. Please refer to the Current Medication list given to you today.  Labwork: None ordered  Testing/Procedures: None ordered  Follow-Up: Remote monitoring is used to monitor your Pacemaker of ICD from home. This monitoring reduces the number of office visits required to check your device to one time per year. It allows us to keep an eye on the functioning of your device to ensure it is working properly. You are scheduled for a device check from home on 11/26/2015. You may send your transmission at any time that day. If you have a wireless device, the transmission will be sent automatically. After your physician reviews your transmission, you will receive a postcard with your next transmission date.  Your physician wants you to follow-up in: 9 months with Dr. [name]. You will receive a reminder letter in the mail two months in advance. If you don't receive a letter, please call our office to schedule the follow-up appointment.   Any Other Special Instructions Will Be Listed Below (If Applicable).  If you need a refill on your cardiac medications before your next appointment, please call your pharmacy.  Thank you for choosing [facility]!!   Dory HornSherri Price, RN [phone]

## 2015-09-06 ENCOUNTER — Ambulatory Visit (INDEPENDENT_AMBULATORY_CARE_PROVIDER_SITE_OTHER): Payer: BC Managed Care – PPO | Admitting: Nurse Practitioner

## 2015-09-06 ENCOUNTER — Encounter: Payer: Self-pay | Admitting: Nurse Practitioner

## 2015-09-06 ENCOUNTER — Other Ambulatory Visit: Payer: Self-pay

## 2015-09-06 ENCOUNTER — Telehealth: Payer: Self-pay

## 2015-09-06 VITALS — BP 149/88 | HR 66 | Temp 99.1°F | Ht 65.0 in | Wt 227.8 lb

## 2015-09-06 DIAGNOSIS — K625 Hemorrhage of anus and rectum: Secondary | ICD-10-CM

## 2015-09-06 DIAGNOSIS — Z1211 Encounter for screening for malignant neoplasm of colon: Secondary | ICD-10-CM

## 2015-09-06 DIAGNOSIS — K5900 Constipation, unspecified: Secondary | ICD-10-CM | POA: Diagnosis not present

## 2015-09-06 NOTE — Telephone Encounter (Signed)
-----   Message from Will Jorja LoaMartin Camnitz, MD sent at 09/06/2015  4:12 PM EDT ----- Should be fine for colonoscopy.  If problems can call EP or [employer] for help.   ----- Message -----    From: Kennis CarinaGinger L Pete Schnitzer, CMA    Sent: 09/06/2015   2:23 PM      To: Will Jorja LoaMartin Camnitz, MD  Sorry it is a Colonoscopy.   Ayden Hardwick   ----- Message -----    From: Regan LemmingWill Martin Camnitz, MD    Sent: 09/06/2015   2:10 PM      To: Kennis CarinaGinger L [place], CMA  I do not know what a TCS is.  Can you please explain?  Thanks! ----- Message -----    From: Kennis CarinaGinger L Ziara Thelander, CMA    Sent: 09/06/2015  11:10 AM      To: Regan LemmingWill Martin Camnitz, MD  She has had a permanent pacemaker placed March 2017. She was referred for office visit due to rectal bleeding and constipation. We are wanting her to have a TCS but wanted to make sure that it was ok since her pacemaker surgery. Please advise    Thanks  Khalik Pewitt

## 2015-09-06 NOTE — Progress Notes (Addendum)
REVIEWED-NO ADDITIONAL RECOMMENDATIONS.  Primary Care Physician:  Jannifer Rodneyhristy Hawks, FNP Primary Gastroenterologist:  Dr. Darrick PennaFields  Chief Complaint  Patient presents with  . Colonoscopy    rectal bleed and constipation some    HPI:   Alejandra Harrison is a 55 y.o. female who presents on referral from primary care for colonoscopy. No previous colonoscopy found in our system. 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 She was referred for office visit due to rectal bleeding and constipation.  Today she states she's doing ok. She has never had a colonoscopy before. Is having constipation. Occurs about 1-2 times a month. When she has constipation issues she will typically had a hard stool and then subsequent stools will be soft/loose. When this happens, she will note some rectal bleeding on the initial hard stool. Denies N/V. Rare abdominal pain which she thinks is her gallbladder, only occurs once every several months. Also rare RLQ pain. Denies melena, GERD symptoms (unless she eats chocolate just before bed, well controlled with a single dose of TUMS.) Denies change in bowel habits, unintentional weight loss. Denies chest pain, dyspnea, dizziness, lightheadedness, syncope, near syncope. Denies any other upper or lower GI symptoms.  Past Medical History  Diagnosis Date  . Anxiety   . History of seasonal allergies   . Psychosis   . 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 Tubal ligation    . Insert / replace / remove pacemaker  05/17/2015  . Ep implantable device N/A 05/17/2015    Procedure: Pacemaker Implant;  Surgeon: Will Jorja LoaMartin Camnitz, MD;  Location: [facility];  Service: Cardiovascular;  Laterality: N/A;    Current Outpatient Prescriptions  Medication Sig Dispense Refill  . ARIPiprazole (ABILIFY) 2 MG tablet Take 1/2 tab at bed time 30 tablet 3  . Vitamin D, Ergocalciferol, (DRISDOL) 50000 units CAPS capsule Take 1 capsule (50,000  Units total) by mouth every 7 (seven) days. 12 capsule 3   No current facility-administered medications for this visit.    Allergies as of 09/06/2015 - Review Complete 09/06/2015  Allergen Reaction Noted  . Oxycodone-acetaminophen Hives and Other (See Comments) 03/21/2011  . Penicillins Hives and Other (See Comments) 03/21/2011    Family History  Problem Relation Age of Onset  . ADD / ADHD Neg Hx   . Alcohol abuse Neg Hx   . Drug abuse Neg Hx   . Anxiety disorder Neg Hx   . Bipolar disorder Neg Hx   . Depression Neg Hx   . OCD Neg Hx   . Paranoid behavior Neg Hx   . Schizophrenia Neg Hx   . Seizures Neg Hx   . Sexual abuse Neg Hx   . Physical abuse Neg Hx   . Dementia Mother   . Diabetes Father   . Breast cancer Paternal Aunt   . Colon cancer Neg Hx     Social History   Social History  . Marital Status: Married    Spouse Name: N/A  . Number of Children: N/A  . Years of Education: N/A   Occupational History  . Not on file.   Social History Main Topics  . Smoking status: Never Smoker   . Smokeless tobacco: Never Used     Comment: Never smoked  . Alcohol Use: No  . Drug Use: No  . Sexual Activity: Yes    Birth Control/ Protection: Post-menopausal   Other Topics Concern  . Not  on file   Social History Narrative    Review of Systems: 10-point ROS negative except as per HPI.    Physical Exam: BP 149/88 mmHg  Pulse 66  Temp(Src) 99.1 F (37.3 C) (Oral)  Ht  (1.651 m)  Wt 227 lb 12.8 oz (103.329 kg)  BMI 37.91 kg/m2 General:   Obese female, alert and oriented. Pleasant and cooperative. Well-nourished and well-developed.  Head:  Normocephalic and atraumatic. Eyes:  Without icterus, sclera clear and conjunctiva pink.  Ears:  Normal auditory acuity. Cardiovascular:  S1, S2 present without murmurs appreciated. Extremities without clubbing or edema. Respiratory:  Clear to auscultation bilaterally. No wheezes, rales, or rhonchi. No distress.    Gastrointestinal:  +BS, soft, non-tender and non-distended. No HSM noted. No guarding or rebound. No masses appreciated.  Rectal:  Deferred  Musculoskalatal:  Symmetrical without gross deformities. Skin:  Pacemaker placement scar noted left upper chest, well healed. Neurologic:  Alert and oriented x4;  grossly normal neurologically. Psych:  Alert and cooperative. Normal mood and affect. Heme/Lymph/Immune: No excessive bruising noted.    09/06/2015 4:13 PM   Disclaimer: This note was dictated with voice recognition software. Similar sounding words can inadvertently be transcribed and may not be corrected upon review.

## 2015-09-06 NOTE — Assessment & Plan Note (Signed)
When patient has bouts of constipation she will typically have a very hard stool first requiring a lot of straining and occasionally noted bright red blood per rectum. This resolves after her stool. Her bleeding is likely due to benign anorectal source but because (rule out more insidious pathology and she has never had a colonoscopy, currently age 55, we'll proceed with colonoscopy as noted below.

## 2015-09-06 NOTE — Patient Instructions (Signed)
1. You can take MiraLAX once a day or twice a day as needed when you're having worsening constipation. 2. We will schedule your colonoscopy for you. We will have you take MiraLAX once a day for 7 days prior to your colon prep for the colonoscopy. 3. We will send a message to your cardiologist asking for the okay to schedule your colonoscopy given that you had a pacemaker placed about 4 months ago. 4. Return for follow-up in 3 months.

## 2015-09-06 NOTE — Assessment & Plan Note (Signed)
Describes intermittent constipation, about 1-2 times a month which tends to last a couple few days. I will have her take MiraLAX once a day to twice a day as needed for episodic constipation. Return for follow-up in 3 months.

## 2015-09-06 NOTE — Assessment & Plan Note (Signed)
[name] currently due for colonoscopy, first-ever exam. [name] 6 years overdue. Given her constipation as noted above I will have her take MiraLAX once a day for the 7 days leading up to her clear liquid diet and bowel prep. This is to promote adequate evacuation with bowel prep. 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 We will contact cardiology to ensure that they are okay with proceeding with a colonoscopy at this time.  Proceed with colonoscopy with Dr. Darrick PennaFields in the near future. The risks, benefits, and alternatives have been discussed in detail with the patient. They state understanding and desire to proceed.   The patient is not on any anticoagulants, anxiolytics, chronic pain medications. Conscious sedation should be adequate for her procedure.

## 2015-09-07 ENCOUNTER — Telehealth: Payer: Self-pay | Admitting: Cardiology

## 2015-09-07 ENCOUNTER — Other Ambulatory Visit: Payer: Self-pay

## 2015-09-07 DIAGNOSIS — Z1211 Encounter for screening for malignant neoplasm of colon: Secondary | ICD-10-CM

## 2015-09-07 MED ORDER — SOD PICOSULFATE-MAG OX-CIT ACD 10-3.5-12 MG-GM-GM PO PACK: 1.0000 | PACK | ORAL | Status: DC

## 2015-09-07 NOTE — Telephone Encounter (Signed)
New message   Pt is wanting a mammogram and she has a pacemaker and she wants to know if Dr.Camnit approves

## 2015-09-07 NOTE — Telephone Encounter (Signed)
Called pt back and informed her that a mammogram was ok and  advised her to make the tech performing test aware that she has a pacemaker.

## 2015-09-07 NOTE — Progress Notes (Signed)
cc'ed to pcp °

## 2015-09-10 NOTE — Telephone Encounter (Signed)
Noted, please confirm scheduling now that cardiology has cleared.

## 2015-09-10 NOTE — Telephone Encounter (Signed)
Noted already done 

## 2015-09-11 ENCOUNTER — Other Ambulatory Visit: Payer: Self-pay

## 2015-09-24 ENCOUNTER — Encounter: Payer: BC Managed Care – PPO | Admitting: *Deleted

## 2015-09-28 ENCOUNTER — Ambulatory Visit (HOSPITAL_COMMUNITY)
Admission: RE | Admit: 2015-09-28 | Discharge: 2015-09-28 | Disposition: A | Discharge status: Home / Self Care | Payer: BC Managed Care – PPO | Source: Ambulatory Visit | Attending: Gastroenterology | Admitting: Gastroenterology

## 2015-09-28 ENCOUNTER — Encounter (HOSPITAL_COMMUNITY)
Admission: RE | Disposition: A | Discharge status: Home / Self Care | Payer: Self-pay | Source: Ambulatory Visit | Attending: Gastroenterology

## 2015-09-28 ENCOUNTER — Encounter (HOSPITAL_COMMUNITY): Payer: Self-pay | Admitting: *Deleted

## 2015-09-28 DIAGNOSIS — K621 Rectal polyp: Secondary | ICD-10-CM

## 2015-09-28 DIAGNOSIS — I442 Atrioventricular block, complete: Secondary | ICD-10-CM | POA: Diagnosis not present

## 2015-09-28 DIAGNOSIS — K635 Polyp of colon: Secondary | ICD-10-CM | POA: Diagnosis not present

## 2015-09-28 DIAGNOSIS — K625 Hemorrhage of anus and rectum: Secondary | ICD-10-CM | POA: Diagnosis not present

## 2015-09-28 DIAGNOSIS — Z950 Presence of cardiac pacemaker: Secondary | ICD-10-CM | POA: Insufficient documentation

## 2015-09-28 DIAGNOSIS — Z1211 Encounter for screening for malignant neoplasm of colon: Secondary | ICD-10-CM

## 2015-09-28 DIAGNOSIS — K648 Other hemorrhoids: Secondary | ICD-10-CM | POA: Insufficient documentation

## 2015-09-28 HISTORY — PX: COLONOSCOPY: SHX5424

## 2015-09-28 SURGERY — COLONOSCOPY
Anesthesia: Moderate Sedation

## 2015-09-28 MED ORDER — SODIUM CHLORIDE 0.9 % IV SOLN
INTRAVENOUS | Status: DC
  Administered 2015-09-28: 1000 mL via INTRAVENOUS

## 2015-09-28 MED ORDER — MEPERIDINE HCL 100 MG/ML IJ SOLN
INTRAMUSCULAR | Status: DC
Start: 2015-09-28 — End: 2015-09-28
  Filled 2015-09-28: qty 2

## 2015-09-28 MED ORDER — MIDAZOLAM HCL 5 MG/5ML IJ SOLN
INTRAMUSCULAR | Status: AC
  Filled 2015-09-28: qty 10

## 2015-09-28 MED ORDER — STERILE WATER FOR IRRIGATION IR SOLN
Status: DC | PRN
  Administered 2015-09-28: 11:00:00

## 2015-09-28 MED ORDER — MEPERIDINE HCL 100 MG/ML IJ SOLN
INTRAMUSCULAR | Status: DC | PRN
  Administered 2015-09-28: 50 mg via INTRAVENOUS
  Administered 2015-09-28: 25 mg via INTRAVENOUS

## 2015-09-28 MED ORDER — MIDAZOLAM HCL 5 MG/5ML IJ SOLN
INTRAMUSCULAR | Status: DC | PRN
  Administered 2015-09-28 (×2): 2 mg via INTRAVENOUS

## 2015-09-28 NOTE — Op Note (Signed)
[facility] Patient Name: Alejandra Harrison Procedure Date: 09/28/2015 10:28 AM MRN: [id number] Date of Birth: 1960/06/18 Attending MD: Jonette Eva , MD CSN: [id number] Age: 55 Admit Type: Outpatient Procedure:                Colonoscopy WITH COLD FORCEPS/SNARE CAUTERY                            POLYPECTOMY Indications:              Rectal bleeding USUALLY ASSOCIATED WITH CONSTIPATION Providers:                Jonette Eva, MD, Edrick Kins, RN, Burke Keels,                            Technician Referring MD:             Edilia Bo. Hawks, FNP Medicines:                Meperidine 75 mg IV, Midazolam 4 mg IV Complications:            No immediate complications. Estimated Blood Loss:     Estimated blood loss was minimal. Procedure:                Pre-Anesthesia Assessment:                           - Prior to the procedure, a History and Physical                            was performed, and patient medications and                            allergies were reviewed. The patient's tolerance of                            previous anesthesia was also reviewed. The risks                            and benefits of the procedure and the sedation                            options and risks were discussed with the patient.                            All questions were answered, and informed consent                            was obtained. Prior Anticoagulants: The patient has                            taken no previous anticoagulant or antiplatelet                            agents. ASA Grade Assessment: II - A patient with  mild systemic disease. After reviewing the risks                            and benefits, the patient was deemed in                            satisfactory condition to undergo the procedure.                            After obtaining informed consent, the colonoscope                            was passed under direct vision. Throughout the                        procedure, the patient's blood pressure, pulse, and                            oxygen saturations were monitored continuously. The                            EC-[id number] ([id number]) scope was introduced through                            the anus and advanced to the the terminal ileum.                            The terminal ileum, ileocecal valve, appendiceal                            orifice, and rectum were photographed. The                            colonoscopy was somewhat difficult due to a                            tortuous colon. Successful completion of the                            procedure was aided by COLOWRAP. The patient                            tolerated the procedure well. The quality of the                            bowel preparation was excellent. Scope In: 10:46:12 AM Scope Out: 11:01:41 AM Scope Withdrawal Time: 0 hours 12 minutes 56 seconds  Total Procedure Duration: 0 hours 15 minutes 29 seconds  Findings:      A 3 mm polyp was found in the sigmoid colon. The polyp was sessile. The       polyp was removed with a cold biopsy forceps. Resection and retrieval       were complete.      A 6 mm polyp was found in the rectum. The polyp was sessile. The polyp  was removed with a hot snare. Resection and retrieval were complete.      Non-bleeding internal hemorrhoids were found. The hemorrhoids were small. Impression:               - TWO COLORECTAL POLYPS REMOVED                           - Non-bleeding internal hemorrhoids. Moderate Sedation:      Moderate (conscious) sedation was administered by the endoscopy nurse       and supervised by the endoscopist. The following parameters were       monitored: oxygen saturation, heart rate, blood pressure, and response       to care. Total physician intraservice time was 28 minutes. Recommendation:           - High fiber diet.                           - Continue present medications.                            - Await pathology results.                           - Repeat colonoscopy in 5-10 years for surveillance.                           CONTINUE YOUR WEIGHT LOSS EFFORTS. LOSE 20 POUNDS.                           DRINK WATER TO KEEP YOUR URINE LIGHT YELLOW.                           USE PREPARATION H FOUR TIMES A DAY IF NEEDED TO                            RELIEVE RECTAL PAIN/PRESSURE/BLEEDING.                           - Patient has a contact number available for                            emergencies. The signs and symptoms of potential                            delayed complications were discussed with the                            patient. Return to normal activities tomorrow.                            Written discharge instructions were provided to the                            patient. Procedure Code(s):        --- Professional ---  [phone], Colonoscopy, flexible; with removal of                            tumor(s), polyp(s), or other lesion(s) by snare                            technique                           45380, 59, Colonoscopy, flexible; with biopsy,                            single or multiple                           99152, Moderate sedation services provided by the                            same physician or other qualified health care                            professional performing the diagnostic or                            therapeutic service that the sedation supports,                            requiring the presence of an independent trained                            observer to assist in the monitoring of the                            patient's level of consciousness and physiological                            status; initial 15 minutes of intraservice time,                            patient age 93 years or older                           [phone], Moderate sedation services; each additional                            15  minutes intraservice time Diagnosis Code(s):        --- Professional ---                           D12.5, Benign neoplasm of sigmoid colon                           K62.1, Rectal polyp                           K64.8, Other hemorrhoids  K62.5, Hemorrhage of anus and rectum CPT copyright 2016 American Medical Association. All rights reserved. The codes documented in this report are preliminary and upon coder review may  be revised to meet current compliance requirements. Jonette Eva, MD Jonette Eva, MD 09/28/2015 12:58:00 PM This report has been signed electronically. Number of Addenda: 0

## 2015-09-28 NOTE — H&P (Signed)
  Primary Care Physician:  Jannifer Rodney, FNP Primary Gastroenterologist:  Dr. Darrick Penna  Pre-Procedure History & Physical: HPI:  Alejandra Harrison is a 55 y.o. female here for  RECTAL BLEEDING.  Past Medical History:  Diagnosis Date  . Anxiety   . AV block, complete (HCC)   . History of seasonal allergies   . Psychosis     Past Surgical History:  Procedure Laterality Date  . EP IMPLANTABLE DEVICE N/A 05/17/2015   Procedure: Pacemaker Implant;  Surgeon: Will Jorja Loa, MD;  Location: [facility];  Service: Cardiovascular;  Laterality: N/A;  . INSERT / REPLACE / REMOVE PACEMAKER  05/17/2015  . TUBAL LIGATION      Prior to Admission medications   Medication Sig Start Date End Date Taking? Authorizing Provider  ARIPiprazole (ABILIFY) 2 MG tablet Take 1/2 tab at bed time 06/04/15  Yes Myrlene Broker, MD  Sod Picosulfate-Mag Ox-Cit Acd 10-3.5-12 MG-GM-GM PACK Take 1 Container by mouth as directed. 09/07/15  Yes West Bali, MD  Vitamin D, Ergocalciferol, (DRISDOL) 50000 units CAPS capsule Take 1 capsule (50,000 Units total) by mouth every 7 (seven) days. 03/15/15   Junie Spencer, FNP    Allergies as of 09/07/2015 - Review Complete 09/06/2015  Allergen Reaction Noted  . Oxycodone-acetaminophen Hives and Other (See Comments) 03/21/2011  . Penicillins Hives and Other (See Comments) 03/21/2011    Family History  Problem Relation Age of Onset  . Dementia Mother   . Diabetes Father   . Breast cancer Paternal Aunt   . ADD / ADHD Neg Hx   . Alcohol abuse Neg Hx   . Drug abuse Neg Hx   . Anxiety disorder Neg Hx   . Bipolar disorder Neg Hx   . Depression Neg Hx   . OCD Neg Hx   . Paranoid behavior Neg Hx   . Schizophrenia Neg Hx   . Seizures Neg Hx   . Sexual abuse Neg Hx   . Physical abuse Neg Hx   . Colon cancer Neg Hx     Social History   Social History  . Marital status: Married    Spouse name: N/A  . Number of children: N/A  . Years of education: N/A    Occupational History  . Not on file.   Social History Main Topics  . Smoking status: Never Smoker  . Smokeless tobacco: Never Used     Comment: Never smoked  . Alcohol use No  . Drug use: No  . Sexual activity: Yes    Birth control/ protection: Post-menopausal   Other Topics Concern  . Not on file   Social History Narrative  . No narrative on file    Review of Systems: See HPI, otherwise negative ROS   Physical Exam: BP (!) 143/83   Pulse 68   Temp 98.2 F (36.8 C) (Oral)   Resp 18   Ht 5\' 5"  (1.651 m)   Wt 227 lb (103 kg)   SpO2 98%   BMI 37.77 kg/m  General:   Alert,  pleasant and cooperative in NAD Head:  Normocephalic and atraumatic. Neck:  Supple; Lungs:  Clear throughout to auscultation.    Heart:  Regular rate and rhythm. Abdomen:  Soft, nontender and nondistended. Normal bowel sounds, without guarding, and without rebound.   Neurologic:  Alert and  oriented x4;  grossly normal neurologically.  Impression/Plan:    RECTAL BLEEDING  PLAN:  1. TCS TODAY

## 2015-10-04 ENCOUNTER — Telehealth: Payer: Self-pay | Admitting: Gastroenterology

## 2015-10-04 ENCOUNTER — Ambulatory Visit (INDEPENDENT_AMBULATORY_CARE_PROVIDER_SITE_OTHER): Payer: BC Managed Care – PPO | Admitting: Psychiatry

## 2015-10-04 ENCOUNTER — Encounter (HOSPITAL_COMMUNITY): Payer: Self-pay | Admitting: Psychiatry

## 2015-10-04 VITALS — BP 123/83 | HR 65 | Ht 65.0 in | Wt 231.8 lb

## 2015-10-04 DIAGNOSIS — F5105 Insomnia due to other mental disorder: Secondary | ICD-10-CM

## 2015-10-04 DIAGNOSIS — F489 Nonpsychotic mental disorder, unspecified: Secondary | ICD-10-CM

## 2015-10-04 DIAGNOSIS — F23 Brief psychotic disorder: Secondary | ICD-10-CM | POA: Diagnosis not present

## 2015-10-04 MED ORDER — ARIPIPRAZOLE 2 MG PO TABS: ORAL_TABLET | ORAL | 3 refills | Status: DC

## 2015-10-04 NOTE — Telephone Encounter (Signed)
Reminder in epic °

## 2015-10-04 NOTE — Telephone Encounter (Signed)
She had TWO HYPERPLASTIC POLYPS removed.   CONTINUE YOUR WEIGHT LOSS EFFORTS. LOSE 20 POUNDS. YOUR BODY MASS INDEX IS OVER 30 WHICH MEANS YOU ARE OBESE. OBESITY IS ASSOCIATED WITH AN INCREASE FOR ALL CANCERS, INCLUDING ESOPHAGEAL AND COLON CANCER.  DRINK WATER TO KEEP YOUR URINE LIGHT YELLOW.  FOLLOW A HIGH FIBER DIET. AVOID ITEMS THAT CAUSE BLOATING.   USE PREPARATION H FOUR TIMES  A DAY IF NEEDED TO RELIEVE RECTAL PAIN/PRESSURE/BLEEDING.   Next colonoscopy in 10 years.

## 2015-10-04 NOTE — Progress Notes (Signed)
Patient ID: Alejandra Harrison, female   DOB: 1961-01-12, 55 y.o.   MRN: [id number] Patient ID: Alejandra Harrison, female   DOB: [birth year], 55 y.o.   MRN: [id number] Patient ID: Alejandra Harrison, female   DOB: 09-15-1960, 55 y.o.   MRN: [id number] Patient ID: Alejandra Harrison, female   DOB: 1961/02/09, 55 y.o.   MRN: [id number] Patient ID: Alejandra Harrison, female   DOB: 11/15/1960, 55 y.o.   MRN: [id number] Patient ID: Alejandra Harrison, female   DOB: [birth year], 55 y.o.   MRN: [id number] Patient ID: Alejandra Harrison, female   DOB: 1960/09/21, 55 y.o.   MRN: [id number] Patient ID: Alejandra Harrison, female   DOB: 28-Dec-1960, 55 y.o.   MRN: [id number] Patient ID: Alejandra Harrison, female   DOB: [birth year], 55 y.o.   MRN: [id number] Martha [id number] Progress Note Alejandra Harrison MRN: [id number] DOB: [birth year] Age: 55 y.o.  Date: 10/04/2015  Chief Complaint  Patient presents with  . Hallucinations  . Follow-up   History of present illness. As patient is a 55 year old married white female who lives with her husband and 2 sons ages 30 and 29 in 55. 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  The patient states that in 2012 she started seeing a figure that she thought may have been God. She also got fixated on a man in her Sunday school and thought that her husband would die and she would have to marry this man. She was having lots of religious visions. She claims that she had not been on any medication or using drugs or alcohol at that time. She had not had a head injury or any additional stress. She may of been going through menopause. She denied visual hallucinations but claims she felt "high and on top of the world." She's never had a history of depression however.  At any rate she was seen here by Dr. Toy Cookey and started on Abilify. She was on a higher dose such as 5 mg in the past and it caused akathisia. Currently she takes 1 mg per day and it seems to be working fairly well. She's no longer having visions or unusual  thoughts and she's functioning well in her job. She's not having significant side effects  The patient returns after 4 months. [name] doing well with her pacemaker and has no restrictions. Her energy is good and she is enjoyed her summer off from school. Her mood is good and she denies any hallucinations depression or anxiety.  Allergies: Allergies  Allergen Reactions  . Oxycodone-Acetaminophen Hives and Other (See Comments)    Hands peeled  . Penicillins Hives and Other (See Comments)    Hands peeled   Medical History: Past Medical History:  Diagnosis Date  . Anxiety   . AV block, complete (HCC)   . History of seasonal allergies   . Psychosis    Surgical History: Past Surgical History:  Procedure Laterality Date  . EP IMPLANTABLE DEVICE N/A 05/17/2015   Procedure: Pacemaker Implant;  Surgeon: Will Meredith Leeds, MD;  Location: [facility];  Service: Cardiovascular;  Laterality: N/A;  . INSERT / REPLACE / REMOVE PACEMAKER  05/17/2015  . TUBAL LIGATION       ROS     Current Medications: Current Outpatient Prescriptions  Medication Sig Dispense Refill  . ARIPiprazole (ABILIFY) 2 MG tablet Take 1/2 tab at bed time 30 tablet 3  . Vitamin D,  Ergocalciferol, (DRISDOL) 50000 units CAPS capsule Take 1 capsule (50,000 Units total) by mouth every 7 (seven) days. 12 capsule 3   No current facility-administered medications for this visit.    Lab Results:  Recent Results (from the past 8736 hour(s))  POCT urinalysis dipstick   Collection Time: 03/14/15 10:31 AM  Result Value Ref Range   Color, UA gold    Clarity, UA clear    Glucose, UA neg    Bilirubin, UA neg'    Ketones, UA neg    Spec Grav, UA 1.010    Blood, UA large    pH, UA 5.0    Protein, UA neg    Urobilinogen, UA negative    Nitrite, UA neg    Leukocytes, UA moderate (2+) (A) Negative  POCT UA - Microscopic Only   Collection Time: 03/14/15 10:32 AM  Result Value Ref Range   WBC, Ur, HPF, POC 1-3     RBC, urine, microscopic 5-10    Bacteria, U Microscopic occ    Mucus, UA neg    Epithelial cells, urine per micros mod    Crystals, Ur, HPF, POC neg    Casts, Ur, LPF, POC neg    Yeast, UA neg   Pap IG w/ reflex to HPV when ASC-U   Collection Time: 03/14/15 11:09 AM  Result Value Ref Range   DIAGNOSIS: Comment    Specimen adequacy: Comment    CLINICIAN PROVIDED ICD10: Comment    Performed by: Comment    PAP SMEAR COMMENT .    Note: Comment    Test Methodology Comment    PAP REFLEX: Comment   Anemia Profile B   Collection Time: 03/14/15 11:14 AM  Result Value Ref Range   Total Iron Binding Capacity 311 250 - 450 ug/dL   UIBC 254 131 - 425 ug/dL   Iron 57 27 - 159 ug/dL   Iron Saturation 18 15 - 55 %   Ferritin 205 (H) 15 - 150 ng/mL   Vitamin B-12 215 211 - 946 pg/mL   Folate 14.1 >3.0 ng/mL   WBC 7.7 3.4 - 10.8 x10E3/uL   RBC 4.66 3.77 - 5.28 x10E6/uL   Hemoglobin 14.3 11.1 - 15.9 g/dL   Hematocrit 41.3 34.0 - 46.6 %   MCV 89 79 - 97 fL   MCH 30.7 26.6 - 33.0 pg   MCHC 34.6 31.5 - 35.7 g/dL   RDW 14.1 12.3 - 15.4 %   Platelets 295 150 - 379 x10E3/uL   Neutrophils 72 %   Lymphs 21 %   Monocytes 5 %   Eos 1 %   Basos 1 %   Neutrophils Absolute 5.5 1.4 - 7.0 x10E3/uL   Lymphocytes Absolute 1.6 0.7 - 3.1 x10E3/uL   Monocytes Absolute 0.4 0.1 - 0.9 x10E3/uL   EOS (ABSOLUTE) 0.1 0.0 - 0.4 x10E3/uL   Basophils Absolute 0.1 0.0 - 0.2 x10E3/uL   Immature Granulocytes 0 %   Immature Grans (Abs) 0.0 0.0 - 0.1 x10E3/uL   Retic Ct Pct 1.6 0.6 - 2.6 %  CMP14+EGFR   Collection Time: 03/14/15 11:14 AM  Result Value Ref Range   Glucose 80 65 - 99 mg/dL   BUN 9 6 - 24 mg/dL   Creatinine, Ser 0.75 0.57 - 1.00 mg/dL   GFR calc non Af Amer 90 >59 mL/min/1.73   GFR calc Af Amer 104 >59 mL/min/1.73   BUN/Creatinine Ratio 12 9 - 23   Sodium 140 134 - 144 mmol/L  Potassium 4.7 3.5 - 5.2 mmol/L   Chloride 101 96 - 106 mmol/L   CO2 21 18 - 29 mmol/L   Calcium 9.6 8.7 - 10.2 mg/dL    Total Protein 6.9 6.0 - 8.5 g/dL   Albumin 4.5 3.5 - 5.5 g/dL   Globulin, Total 2.4 1.5 - 4.5 g/dL   Albumin/Globulin Ratio 1.9 1.1 - 2.5   Bilirubin Total 0.4 0.0 - 1.2 mg/dL   Alkaline Phosphatase 78 39 - 117 IU/L   AST 22 0 - 40 IU/L   ALT 30 0 - 32 IU/L  Hepatitis C antibody   Collection Time: 03/14/15 11:14 AM  Result Value Ref Range   Hep C Virus Ab <0.1 0.0 - 0.9 s/co ratio  VITAMIN D 25 Hydroxy (Vit-D Deficiency, Fractures)   Collection Time: 03/14/15 11:14 AM  Result Value Ref Range   Vit D, 25-Hydroxy 10.8 (L) 30.0 - 100.0 ng/mL  Thyroid Panel With TSH   Collection Time: 03/14/15 11:14 AM  Result Value Ref Range   TSH 2.730 0.450 - 4.500 uIU/mL   T4, Total 11.0 4.5 - 12.0 ug/dL   T3 Uptake Ratio 24 24 - 39 %   Free Thyroxine Index 2.6 1.2 - 4.9  Lipid panel   Collection Time: 03/14/15 11:14 AM  Result Value Ref Range   Cholesterol, Total 180 100 - 199 mg/dL   Triglycerides 103 0 - 149 mg/dL   HDL 62 >39 mg/dL   VLDL Cholesterol Cal 21 5 - 40 mg/dL   LDL Calculated 97 0 - 99 mg/dL   Chol/HDL Ratio 2.9 0.0 - 4.4 ratio units  Veritor Flu A/B Waived   Collection Time: 05/07/15  9:52 AM  Result Value Ref Range   Influenza A Positive (A) Negative   Influenza B Negative Negative  Basic metabolic panel   Collection Time: 05/14/15  4:07 PM  Result Value Ref Range   Sodium 141 135 - 146 mmol/L   Potassium 4.6 3.5 - 5.3 mmol/L   Chloride 106 98 - 110 mmol/L   CO2 25 20 - 31 mmol/L   Glucose, Bld 81 65 - 99 mg/dL   BUN 14 7 - 25 mg/dL   Creat 0.92 0.50 - 1.05 mg/dL   Calcium 9.2 8.6 - 10.4 mg/dL  CBC w/Diff   Collection Time: 05/14/15  4:07 PM  Result Value Ref Range   WBC 9.3 4.0 - 10.5 K/uL   RBC 4.76 3.87 - 5.11 MIL/uL   Hemoglobin 13.9 12.0 - 15.0 g/dL   HCT 41.7 36.0 - 46.0 %   MCV 87.6 78.0 - 100.0 fL   MCH 29.2 26.0 - 34.0 pg   MCHC 33.3 30.0 - 36.0 g/dL   RDW 13.8 11.5 - 15.5 %   Platelets 330 150 - 400 K/uL   MPV 11.2 8.6 - 12.4 fL   Neutrophils  Relative % 63 43 - 77 %   Neutro Abs 5.9 1.7 - 7.7 K/uL   Lymphocytes Relative 28 12 - 46 %   Lymphs Abs 2.6 0.7 - 4.0 K/uL   Monocytes Relative 8 3 - 12 %   Monocytes Absolute 0.7 0.1 - 1.0 K/uL   Eosinophils Relative 1 0 - 5 %   Eosinophils Absolute 0.1 0.0 - 0.7 K/uL   Basophils Relative 0 0 - 1 %   Basophils Absolute 0.0 0.0 - 0.1 K/uL   Smear Review Criteria for review not met   Surgical pcr screen   Collection Time: 05/17/15 10:33 AM  Result Value Ref Range   MRSA, PCR NEGATIVE NEGATIVE   Staphylococcus aureus NEGATIVE NEGATIVE  Urinalysis, Routine w reflex microscopic (not at [facility])   Collection Time: 05/18/15  8:39 AM  Result Value Ref Range   Color, Urine YELLOW YELLOW   APPearance CLEAR CLEAR   Specific Gravity, Urine 1.006 1.005 - 1.030   pH 6.5 5.0 - 8.0   Glucose, UA NEGATIVE NEGATIVE mg/dL   Hgb urine dipstick NEGATIVE NEGATIVE   Bilirubin Urine NEGATIVE NEGATIVE   Ketones, ur NEGATIVE NEGATIVE mg/dL   Protein, ur NEGATIVE NEGATIVE mg/dL   Nitrite NEGATIVE NEGATIVE   Leukocytes, UA TRACE (A) NEGATIVE  Urine microscopic-add on   Collection Time: 05/18/15  8:39 AM  Result Value Ref Range   Squamous Epithelial / LPF 0-5 (A) NONE SEEN   WBC, UA 0-5 0 - 5 WBC/hpf   RBC / HPF NONE SEEN 0 - 5 RBC/hpf   Bacteria, UA RARE (A) NONE SEEN  Implantable device check   Collection Time: 05/30/15  1:44 PM  Result Value Ref Range   Date Time Interrogation Session [id number]    Pulse Generator Manufacturer [employer]    Pulse Gen Model 2272 Assurity MRI    Pulse Gen Serial Number [id number]    Implantable Pulse Generator Type Implantable Pulse Generator    Implantable Pulse Generator Implant Date [id number]    Implantable Lead Manufacturer [facility]    Implantable Lead Model Tendril MRI [id number]    Implantable Lead Serial Number [id number]    Implantable Lead Implant Date 22025427    Implantable Lead Location [id number]    Implantable Lead Manufacturer [facility]    Implantable Lead  Model Tendril MRI [id number]    Implantable Lead Serial Number [id number]    Implantable Lead Implant Date 06237628    Implantable Lead Location [id number]    Lead Channel Setting Sensing Sensitivity 4.0 mV   Lead Channel Setting Pacing Amplitude 3.5 V   Lead Channel Setting Pacing Pulse Width 0.4 ms   Lead Channel Setting Pacing Amplitude 0.625    Lead Channel Impedance Value 525.0 ohm   Lead Channel Sensing Intrinsic Amplitude 4.0 mV   Lead Channel Pacing Threshold Amplitude 0.5 V   Lead Channel Pacing Threshold Pulse Width 0.4 ms   Lead Channel Pacing Threshold Amplitude 0.5 V   Lead Channel Pacing Threshold Pulse Width 0.4 ms   Lead Channel Impedance Value 587.5 ohm   Lead Channel Sensing Intrinsic Amplitude 12.0 mV   Lead Channel Pacing Threshold Amplitude 0.5 V   Lead Channel Pacing Threshold Pulse Width 0.4 ms   Lead Channel Pacing Threshold Amplitude 0.5 V   Lead Channel Pacing Threshold Pulse Width 0.4 ms   Battery Status Unknown    Battery Remaining Longevity 134.4    Battery Voltage 3.01 V   Brady Statistic RA Percent Paced 1.0 %   Brady Statistic RV Percent Paced 98.0 %   Eval Rhythm CHB   Implantable device check   Collection Time: 08/27/15  1:53 PM  Result Value Ref Range   Date Time Interrogation Session [id number]    Pulse Generator Manufacturer [employer]    Pulse Gen Model 2272 Assurity MRI    Pulse Gen Serial Number [id number]    Implantable Pulse Generator Type Implantable Pulse Generator    Implantable Pulse Generator Implant Date [id number]    Implantable Lead Manufacturer [employer]    Implantable Lead Model Tendril MRI [id number]    Implantable Lead Serial Number [id number]    Implantable Lead Implant  Date 16010932    Implantable Lead Location [phone]    Implantable Lead Manufacturer [employer]    Implantable Lead Model Tendril MRI [id number]    Implantable Lead Serial Number [id number]    Implantable Lead Implant Date [id number]    Implantable Lead Location [phone]    Lead  Channel Setting Sensing Sensitivity 4.0 mV   Lead Channel Setting Pacing Amplitude 2.0 V   Lead Channel Setting Pacing Pulse Width 0.4 ms   Lead Channel Setting Pacing Amplitude 0.75 V   Lead Channel Impedance Value 475.0 ohm   Lead Channel Sensing Intrinsic Amplitude 3.8 mV   Lead Channel Pacing Threshold Amplitude 0.5 V   Lead Channel Pacing Threshold Pulse Width 0.4 ms   Lead Channel Pacing Threshold Amplitude 0.5 V   Lead Channel Pacing Threshold Pulse Width 0.4 ms   Lead Channel Impedance Value 587.5 ohm   Lead Channel Pacing Threshold Amplitude 0.5 V   Lead Channel Pacing Threshold Pulse Width 0.4 ms   Lead Channel Pacing Threshold Amplitude 0.5 V   Lead Channel Pacing Threshold Pulse Width 0.4 ms   Battery Status Unknown    Battery Remaining Longevity 129.6    Battery Voltage 2.99 V   Brady Statistic RA Percent Paced 0.75 %   Brady Statistic RV Percent Paced 96.0 %   Eval Rhythm CHB VP 30    Family doctor did blood work last year and it all came back good.  Mental status examination Patient is obese female who is casually dressed and fairly groomed.  She appears to be her stated age.  Her speech is clear and coherent.  Her thought process is  logical.  She denies any active or passive suicidal thoughts homicidal thought.  At this time she denies any visual or auditory hallucination.  She described her mood as good and her affect is mood appropriate.   There were no flight of ideas or any loose association.  Her attention concentration is fair.  [name] oriented x3.  There were no tremors or shakes.  Her insight judgment and impulse control is good.  Diagnosis:   Axis I: Psychotic Disorder NOS Axis II: Deferred Axis III:  Past Medical History  Diagnosis Date  . Anxiety   . Phlebitis   . History of seasonal allergies   . Psychosis    Axis IV: other psychosocial or environmental problems Axis V: 51-60 moderate symptoms  Plan: I will continue Abilify 1 mg at bedtime to  prevent recurrence of psychosis  Patient does not want to try higher dose.  [name] much comfortable with the current dose.  She does not have any tremors or shakes.  Recommend to call us back if she has any question or concern.  . She will Follow-up with [place] in 5 months  MEDICATIONS this encounter: Meds ordered this encounter  Medications  . ARIPiprazole (ABILIFY) 2 MG tablet    Sig: Take 1/2 tab at bed time    Dispense:  30 tablet    Refill:  3   Medical Decision Making Problem Points:  Established problem, stable/improving (1), Review of last therapy session (1) and Review of psycho-social stressors (1) Data Points:  Review or order clinical lab tests (1) Review of medication regiment & side effects (2)   Alejandra Godby, MD

## 2015-10-04 NOTE — Discharge Instructions (Signed)
You have small linternal hemorrhoids, WHICH ARE THE CAUSE FOR YOUR intermittent RECTAL BLEEDING. YOU HAD TWO POLYPS REMOVED. THE LAST PART OF YOU SMALL BOWEL IS NORMAL.   CONTINUE YOUR WEIGHT LOSS EFFORTS. LOSE 20 POUNDS. YOUR BODY MASS INDEX IS OVER 30 WHICH MEANS YOU ARE OBESE. OBESITY IS ASSOCIATED WITH AN INCREASE FOR ALL CANCERS, INCLUDING ESOPHAGEAL AND COLON CANCER.  DRINK WATER TO KEEP YOUR URINE LIGHT YELLOW.  FOLLOW A HIGH FIBER DIET. AVOID ITEMS THAT CAUSE BLOATING. See info below.  USE PREPARATION H FOUR TIMES  A DAY IF NEEDED TO RELIEVE RECTAL PAIN/PRESSURE/BLEEDING.   YOUR BIOPSY RESULTS WILL BE AVAILABLE IN MY CHART  AUG 8 AND MY OFFICE WILL CONTACT YOU IN 10-14 DAYS WITH YOUR RESULTS.   Next colonoscopy in 5-10 years.  Colonoscopy Care After Read the instructions outlined below and refer to this sheet in the next week. These discharge instructions provide you with general information on caring for yourself after you leave the hospital. While your treatment has been planned according to the most current medical practices available, unavoidable complications occasionally occur. If you have any problems or questions after discharge, call DR. Joshwa Hemric, [phone].  ACTIVITY  You may resume your regular activity, but move at a slower pace for the next 24 hours.   Take frequent rest periods for the next 24 hours.   Walking will help get rid of the air and reduce the bloated feeling in your belly (abdomen).   No driving for 24 hours (because of the medicine (anesthesia) used during the test).   You may shower.   Do not sign any important legal documents or operate any machinery for 24 hours (because of the anesthesia used during the test).    NUTRITION  Drink plenty of fluids.   You may resume your normal diet as instructed by your doctor.   Begin with a light meal and progress to your normal diet. Heavy or fried foods are harder to digest and may make you feel sick  to your stomach (nauseated).   Avoid alcoholic beverages for 24 hours or as instructed.    MEDICATIONS  You may resume your normal medications.   WHAT YOU CAN EXPECT TODAY  Some feelings of bloating in the abdomen.   Passage of more gas than usual.   Spotting of blood in your stool or on the toilet paper  .  IF YOU HAD POLYPS REMOVED DURING THE COLONOSCOPY:  Eat a soft diet IF YOU HAVE NAUSEA, BLOATING, ABDOMINAL PAIN, OR VOMITING.    FINDING OUT THE RESULTS OF YOUR TEST Not all test results are available during your visit. DR. Darrick Penna WILL CALL YOU WITHIN 14 DAYS OF YOUR PROCEDUE WITH YOUR RESULTS. Do not assume everything is normal if you have not heard from DR. Dorann Davidson, CALL HER OFFICE AT [phone].  SEEK IMMEDIATE MEDICAL ATTENTION AND CALL THE OFFICE: [phone] IF:  You have more than a spotting of blood in your stool.   Your belly is swollen (abdominal distention).   You are nauseated or vomiting.   You have a temperature over 101F.   You have abdominal pain or discomfort that is severe or gets worse throughout the day.  High-Fiber Diet A high-fiber diet changes your normal diet to include more whole grains, legumes, fruits, and vegetables. Changes in the diet involve replacing refined carbohydrates with unrefined foods. The calorie level of the diet is essentially unchanged. The Dietary Reference Intake (recommended amount) for adult males is 38 grams per  day. For adult females, it is 25 grams per day. Pregnant and lactating women should consume 28 grams of fiber per day. Fiber is the intact part of a plant that is not broken down during digestion. Functional fiber is fiber that has been isolated from the plant to provide a beneficial effect in the body. PURPOSE  Increase stool bulk.   Ease and regulate bowel movements.   Lower cholesterol.  REDUCE RISK OF COLON CANCER  INDICATIONS THAT YOU NEED MORE FIBER  Constipation and hemorrhoids.    Uncomplicated diverticulosis (intestine condition) and irritable bowel syndrome.   Weight management.   As a protective measure against hardening of the arteries (atherosclerosis), diabetes, and cancer.   GUIDELINES FOR INCREASING FIBER IN THE DIET  Start adding fiber to the diet slowly. A gradual increase of about 5 more grams (2 slices of whole-wheat bread, 2 servings of most fruits or vegetables, or 1 bowl of high-fiber cereal) per day is best. Too rapid an increase in fiber may result in constipation, flatulence, and bloating.   Drink enough water and fluids to keep your urine clear or pale yellow. Water, juice, or caffeine-free drinks are recommended. Not drinking enough fluid may cause constipation.   Eat a variety of high-fiber foods rather than one type of fiber.   Try to increase your intake of fiber through using high-fiber foods rather than fiber pills or supplements that contain small amounts of fiber.   The goal is to change the types of food eaten. Do not supplement your present diet with high-fiber foods, but replace foods in your present diet.   INCLUDE A VARIETY OF FIBER SOURCES  Replace refined and processed grains with whole grains, canned fruits with fresh fruits, and incorporate other fiber sources. White rice, white breads, and most bakery goods contain little or no fiber.   Brown whole-grain rice, buckwheat oats, and many fruits and vegetables are all good sources of fiber. These include: broccoli, Brussels sprouts, cabbage, cauliflower, beets, sweet potatoes, white potatoes (skin on), carrots, tomatoes, eggplant, squash, berries, fresh fruits, and dried fruits.   Cereals appear to be the richest source of fiber. Cereal fiber is found in whole grains and bran. Bran is the fiber-rich outer coat of cereal grain, which is largely removed in refining. In whole-grain cereals, the bran remains. In breakfast cereals, the largest amount of fiber is found in those with  "bran" in their names. The fiber content is sometimes indicated on the label.   You may need to include additional fruits and vegetables each day.   In baking, for 1 cup white flour, you may use the following substitutions:   1 cup whole-wheat flour minus 2 tablespoons.   1/2 cup white flour plus 1/2 cup whole-wheat flour.   Polyps, Colon  A polyp is extra tissue that grows inside your body. Colon polyps grow in the large intestine. The large intestine, also called the colon, is part of your digestive system. It is a long, hollow tube at the end of your digestive tract where your body makes and stores stool. Most polyps are not dangerous. They are benign. This means they are not cancerous. But over time, some types of polyps can turn into cancer. Polyps that are smaller than a pea are usually not harmful. But larger polyps could someday become or may already be cancerous. To be safe, doctors remove all polyps and test them.   PREVENTION There is not one sure way to prevent polyps. You might be  able to lower your risk of getting them if you:  Eat more fruits and vegetables and less fatty food.   Do not smoke.   Avoid alcohol.   Exercise every day.   Lose weight if you are overweight.   Eating more calcium and folate can also lower your risk of getting polyps. Some foods that are rich in calcium are milk, cheese, and broccoli. Some foods that are rich in folate are chickpeas, kidney beans, and spinach.    Hemorrhoids Hemorrhoids are dilated (enlarged) veins around the rectum. Sometimes clots will form in the veins. This makes them swollen and painful. These are called thrombosed hemorrhoids. Causes of hemorrhoids include:  Constipation.   Straining to have a bowel movement.   HEAVY LIFTING  HOME CARE INSTRUCTIONS  Eat a well balanced diet and drink 6 to 8 glasses of water every day to avoid constipation. You may also use a bulk laxative.   Avoid straining to have bowel  movements.   Keep anal area dry and clean.   Do not use a donut shaped pillow or sit on the toilet for long periods. This increases blood pooling and pain.   Move your bowels when your body has the urge; this will require less straining and will decrease pain and pressure.

## 2015-10-04 NOTE — Telephone Encounter (Signed)
Pt is aware of results. 

## 2015-11-23 ENCOUNTER — Ambulatory Visit (INDEPENDENT_AMBULATORY_CARE_PROVIDER_SITE_OTHER): Payer: BC Managed Care – PPO

## 2015-11-23 DIAGNOSIS — Z23 Encounter for immunization: Secondary | ICD-10-CM

## 2015-11-26 ENCOUNTER — Telehealth: Payer: Self-pay | Admitting: Cardiology

## 2015-11-26 ENCOUNTER — Ambulatory Visit (INDEPENDENT_AMBULATORY_CARE_PROVIDER_SITE_OTHER): Payer: BC Managed Care – PPO | Admitting: *Deleted

## 2015-11-26 ENCOUNTER — Telehealth: Payer: Self-pay | Admitting: Cardiovascular Disease

## 2015-11-26 ENCOUNTER — Ambulatory Visit: Payer: BC Managed Care – PPO | Admitting: *Deleted

## 2015-11-26 DIAGNOSIS — I442 Atrioventricular block, complete: Secondary | ICD-10-CM | POA: Diagnosis not present

## 2015-11-26 NOTE — Telephone Encounter (Signed)
LMOVM reminding pt to send remote transmission.   

## 2015-11-26 NOTE — Telephone Encounter (Signed)
Spoke w/ pt husband explained to him that pt remote transmission did not come through automatically. Instructed him how pt is to send a manual transmission. Pt husband verbalized understanding.

## 2015-11-26 NOTE — Telephone Encounter (Signed)
New Message:     Please call,question about her transmitting.

## 2015-11-30 ENCOUNTER — Encounter: Payer: Self-pay | Admitting: Cardiology

## 2015-12-05 ENCOUNTER — Encounter: Payer: Self-pay | Admitting: Cardiology

## 2015-12-05 NOTE — Progress Notes (Signed)
Remote pacemaker transmission.   

## 2015-12-06 LAB — CUP PACEART REMOTE DEVICE CHECK
Brady Statistic RA Percent Paced: 2.4 %
Brady Statistic RV Percent Paced: 77 %
Implantable Lead Implant Date: 20170323
Implantable Lead Location: 753860
Lead Channel Impedance Value: 480 Ohm
Lead Channel Pacing Threshold Amplitude: 0.5 V
Lead Channel Pacing Threshold Pulse Width: 0.4 ms
MDC IDC LEAD IMPLANT DT: 20170323
MDC IDC LEAD LOCATION: 753859
MDC IDC MSMT LEADCHNL RA PACING THRESHOLD AMPLITUDE: 0.5 V
MDC IDC MSMT LEADCHNL RA PACING THRESHOLD PULSEWIDTH: 0.4 ms
MDC IDC MSMT LEADCHNL RA SENSING INTR AMPL: 3.8 mV
MDC IDC MSMT LEADCHNL RV IMPEDANCE VALUE: 590 Ohm
MDC IDC PG SERIAL: 3134337
MDC IDC SESS DTM: 20171012105626
Pulse Gen Model: 2272

## 2015-12-10 ENCOUNTER — Ambulatory Visit: Payer: Self-pay | Admitting: Nurse Practitioner

## 2015-12-20 ENCOUNTER — Ambulatory Visit (INDEPENDENT_AMBULATORY_CARE_PROVIDER_SITE_OTHER): Payer: BC Managed Care – PPO | Admitting: Nurse Practitioner

## 2015-12-20 ENCOUNTER — Encounter: Payer: Self-pay | Admitting: Nurse Practitioner

## 2015-12-20 VITALS — BP 149/97 | HR 64 | Temp 98.0°F | Ht 65.0 in | Wt 228.8 lb

## 2015-12-20 DIAGNOSIS — K219 Gastro-esophageal reflux disease without esophagitis: Secondary | ICD-10-CM

## 2015-12-20 DIAGNOSIS — K5900 Constipation, unspecified: Secondary | ICD-10-CM | POA: Diagnosis not present

## 2015-12-20 DIAGNOSIS — Z86010 Personal history of colonic polyps: Secondary | ICD-10-CM

## 2015-12-20 NOTE — Patient Instructions (Signed)
1. Continue increased fiber intake. 2. Make sure drinking plenty of water. 3. You can use MiraLAX one to 2 times a day as needed for episodic constipation. 4. Call us if any worsening symptoms. 5. Otherwise, return for follow-up as needed. You're due for a repeat colonoscopy in 5-10 years.

## 2015-12-20 NOTE — Assessment & Plan Note (Signed)
Constipation currently under good control with increased fiber, primarily to daily oatmeal. Recommend she continue high-fiber diet, weight loss, adequate water. She can use MiraLAX one to 2 times a day as needed. Return for follow-up as needed for any worsening symptoms.

## 2015-12-20 NOTE — Assessment & Plan Note (Signed)
2 polyps found to be hyperplastic. Recommended repeat colonoscopy 5-10 years. Return for follow-up as needed.

## 2015-12-20 NOTE — Progress Notes (Signed)
Referring Provider: Junie Spencer, FNP Primary Care Physician:  Jannifer Rodney, FNP Primary GI:  Dr. Darrick Penna  Chief Complaint  Patient presents with  . Follow-up    constipation is better, eating oatmeal in am and more vegetables has helped    HPI:   Alejandra Harrison is a 55 y.o. female who presents Up on constipation. Patient was last seen in our office 09/06/2015 at which point it was noted she is currently due for colonoscopy, first-ever screening exam 6 years overdue. Constipated as noted above and was recommended to take MiraLAX once a day for the 7 days leading up to her prep. Also recommended MiraLAX one to 2 times a day as needed for episodic constipation. Return for follow-up in 3 months.   Colonoscopy was completed on 09/28/2015 which found 2 colorectal polyps, nonbleeding internal hemorrhoids. Pathology found the polyps to be hyperplastic. Recommended high-fiber diet and colonoscopy in 5-10 years for surveillance. Recommend continue weight loss efforts, lose 20 pounds, adequate water intake.  Today she states she's doing well. Constipation is improved with increased fiber intake through oatmeal. Hasn't needed Miralax yet. Denies abdominal pain, N/V, hematemesis, melena. Rare heartburn which is well managed by OTC antacids. Denies chest pain, dyspnea, dizziness, lightheadedness, syncope, near syncope. Denies any other upper or lower GI symptoms.  Past Medical History:  Diagnosis Date  . Anxiety   . AV block, complete (HCC)   . History of seasonal allergies   . Psychosis     Past Surgical History:  Procedure Laterality Date  . COLONOSCOPY N/A 09/28/2015   Procedure: COLONOSCOPY;  Surgeon: West Bali, MD;  Location: [facility];  Service: Endoscopy;  Laterality: N/A;  1030  . EP IMPLANTABLE DEVICE N/A 05/17/2015   Procedure: Pacemaker Implant;  Surgeon: Will Jorja Loa, MD;  Location: [facility];  Service: Cardiovascular;  Laterality: N/A;  . INSERT / REPLACE  / REMOVE PACEMAKER  05/17/2015  . TUBAL LIGATION      Current Outpatient Prescriptions  Medication Sig Dispense Refill  . ARIPiprazole (ABILIFY) 2 MG tablet Take 1/2 tab at bed time 30 tablet 3  . Vitamin D, Ergocalciferol, (DRISDOL) 50000 units CAPS capsule Take 1 capsule (50,000 Units total) by mouth every 7 (seven) days. 12 capsule 3   No current facility-administered medications for this visit.     Allergies as of 12/20/2015 - Review Complete 12/20/2015  Allergen Reaction Noted  . Oxycodone-acetaminophen Hives and Other (See Comments) 03/21/2011  . Penicillins Hives and Other (See Comments) 03/21/2011    Family History  Problem Relation Age of Onset  . Dementia Mother   . Diabetes Father   . Breast cancer Paternal Aunt   . ADD / ADHD Neg Hx   . Alcohol abuse Neg Hx   . Drug abuse Neg Hx   . Anxiety disorder Neg Hx   . Bipolar disorder Neg Hx   . Depression Neg Hx   . OCD Neg Hx   . Paranoid behavior Neg Hx   . Schizophrenia Neg Hx   . Seizures Neg Hx   . Sexual abuse Neg Hx   . Physical abuse Neg Hx   . Colon cancer Neg Hx     Social History   Social History  . Marital status: Married    Spouse name: N/A  . Number of children: N/A  . Years of education: N/A   Social History Main Topics  . Smoking status: Never Smoker  . Smokeless tobacco:  Never Used     Comment: Never smoked  . Alcohol use No  . Drug use: No  . Sexual activity: Yes    Birth control/ protection: Post-menopausal   Other Topics Concern  . None   Social History Narrative  . None    Review of Systems: Complete ROS negative except as per HPI.   Physical Exam: BP (!) 149/97   Pulse 64   Temp 98 F (36.7 C) (Oral)   Ht 5\' 5"  (1.651 m)   Wt 228 lb 12.8 oz (103.8 kg)   BMI 38.07 kg/m  General:   Obese female. Alert and oriented. Pleasant and cooperative. Well-nourished and well-developed.  Head:  Normocephalic and atraumatic. Eyes:  Without icterus, sclera clear and conjunctiva  pink.  Ears:  Normal auditory acuity. Cardiovascular:  S1, S2 present without murmurs appreciated. Extremities without clubbing or edema. Respiratory:  Clear to auscultation bilaterally. No wheezes, rales, or rhonchi. No distress.  Gastrointestinal:  +BS, rounded but soft, non-tender and non-distended. No HSM noted. No guarding or rebound. No masses appreciated.  Rectal:  Deferred  Musculoskalatal:  Symmetrical without gross deformities. Neurologic:  Alert and oriented x4;  grossly normal neurologically. Psych:  Alert and cooperative. Normal mood and affect. Heme/Lymph/Immune: No excessive bruising noted.    12/20/2015 12:49 PM   Disclaimer: This note was dictated with voice recognition software. Similar sounding words can inadvertently be transcribed and may not be corrected upon review.

## 2015-12-20 NOTE — Assessment & Plan Note (Signed)
Rare GERD symptoms well managed with over-the-counter antacids as needed. Call if any worsening symptoms. Return for follow-up as needed.

## 2015-12-20 NOTE — Progress Notes (Signed)
cc'ed to pcp °

## 2016-02-22 ENCOUNTER — Telehealth (HOSPITAL_COMMUNITY): Payer: Self-pay | Admitting: *Deleted

## 2016-02-22 NOTE — Telephone Encounter (Signed)
Pt was scheduled to f/u with provider on Feb 26, 2016 and provider will not be in office. Pt will be rescheduled for providers next available. Pt is in need for refills for her Abilify 2 mg 10-04-2015 30 tabs 3 refills. Pt Pharmacy is [facility] [phone].

## 2016-02-22 NOTE — Telephone Encounter (Signed)
left voice message regarding provider out of office 02/26/16. 

## 2016-02-23 ENCOUNTER — Other Ambulatory Visit (HOSPITAL_COMMUNITY): Payer: Self-pay | Admitting: Psychiatry

## 2016-02-23 DIAGNOSIS — F23 Brief psychotic disorder: Secondary | ICD-10-CM

## 2016-02-23 DIAGNOSIS — F5105 Insomnia due to other mental disorder: Secondary | ICD-10-CM

## 2016-02-23 MED ORDER — ARIPIPRAZOLE 2 MG PO TABS: ORAL_TABLET | ORAL | 3 refills | Status: DC

## 2016-02-23 NOTE — Telephone Encounter (Signed)
sent 

## 2016-02-26 ENCOUNTER — Ambulatory Visit (HOSPITAL_COMMUNITY): Payer: Self-pay | Admitting: Psychiatry

## 2016-02-26 NOTE — Telephone Encounter (Signed)
noted 

## 2016-03-05 ENCOUNTER — Ambulatory Visit (HOSPITAL_COMMUNITY): Payer: Self-pay | Admitting: Psychiatry

## 2016-03-05 ENCOUNTER — Ambulatory Visit (INDEPENDENT_AMBULATORY_CARE_PROVIDER_SITE_OTHER): Payer: BC Managed Care – PPO | Admitting: *Deleted

## 2016-03-05 DIAGNOSIS — I442 Atrioventricular block, complete: Secondary | ICD-10-CM | POA: Diagnosis not present

## 2016-03-05 NOTE — Progress Notes (Signed)
Remote pacemaker transmission.   

## 2016-03-06 ENCOUNTER — Encounter: Payer: Self-pay | Admitting: Cardiology

## 2016-03-10 ENCOUNTER — Encounter (HOSPITAL_COMMUNITY): Payer: Self-pay | Admitting: Psychiatry

## 2016-03-10 ENCOUNTER — Ambulatory Visit (INDEPENDENT_AMBULATORY_CARE_PROVIDER_SITE_OTHER): Payer: BC Managed Care – PPO | Admitting: Psychiatry

## 2016-03-10 VITALS — BP 126/88 | HR 65 | Ht 65.0 in | Wt 232.6 lb

## 2016-03-10 DIAGNOSIS — Z880 Allergy status to penicillin: Secondary | ICD-10-CM | POA: Diagnosis not present

## 2016-03-10 DIAGNOSIS — Z9889 Other specified postprocedural states: Secondary | ICD-10-CM

## 2016-03-10 DIAGNOSIS — Z888 Allergy status to other drugs, medicaments and biological substances status: Secondary | ICD-10-CM | POA: Diagnosis not present

## 2016-03-10 DIAGNOSIS — F23 Brief psychotic disorder: Secondary | ICD-10-CM

## 2016-03-10 DIAGNOSIS — F5105 Insomnia due to other mental disorder: Secondary | ICD-10-CM | POA: Diagnosis not present

## 2016-03-10 DIAGNOSIS — Z79899 Other long term (current) drug therapy: Secondary | ICD-10-CM

## 2016-03-10 MED ORDER — ARIPIPRAZOLE 2 MG PO TABS: ORAL_TABLET | ORAL | 3 refills | Status: DC

## 2016-03-10 NOTE — Progress Notes (Signed)
Patient ID: Alejandra Harrison, female   DOB: [birth year], 56 y.o.   MRN: [id number] Patient ID: Alejandra Harrison, female   DOB: September 18, 1960, 56 y.o.   MRN: [id number] Patient ID: Alejandra Harrison, female   DOB: [birth year], 56 y.o.   MRN: [id number] Patient ID: Alejandra Harrison, female   DOB: [birth year], 56 y.o.   MRN: [id number] Patient ID: Alejandra Harrison, female   DOB: May 22, 1960, 56 y.o.   MRN: [id number] Patient ID: Alejandra Harrison, female   DOB: May 06, 1960, 56 y.o.   MRN: [id number] Patient ID: Alejandra Harrison, female   DOB: 06-06-1960, 56 y.o.   MRN: [id number] Patient ID: Alejandra Harrison, female   DOB: [birth year], 56 y.o.   MRN: [id number] Patient ID: Alejandra Harrison, female   DOB: 1960/11/27, 56 y.o.   MRN: [id number] [place] [id number] Progress Note Alejandra Harrison MRN: [id number] DOB: 05/30/1960 Age: 56 y.o.  Date: 03/10/2016  Chief Complaint  Patient presents with  . Hallucinations  . Follow-up   History of present illness. As patient is a 56 year old married white female who lives with her husband and 2 sons ages 57 and 3 in 36. 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  The patient states that in 2012 she started seeing a figure that she thought may have been God. She also got fixated on a man in her Sunday school and thought that her husband would die and she would have to marry this man. She was having lots of religious visions. She claims that she had not been on any medication or using drugs or alcohol at that time. She had not had a head injury or any additional stress. She may of been going through menopause. She denied visual hallucinations but claims she felt "high and on top of the world." She's never had a history of depression however.  At any rate she was seen here by Dr. Toy Cookey and started on Abilify. She was on a higher dose such as 5 mg in the past and it caused akathisia. Currently she takes 1 mg per day and it seems to be working fairly well. She's no longer having visions or unusual  thoughts and she's functioning well in her job. She's not having significant side effects  The patient returns after 4 months. [name] doing well with her pacemaker and has no restrictions. Her energy is good and she is enjoyed her summer off from school. Her mood is good and she denies any hallucinations depression or anxiety.  Allergies: Allergies  Allergen Reactions  . Oxycodone-Acetaminophen Hives and Other (See Comments)    Hands peeled  . Penicillins Hives and Other (See Comments)    Hands peeled   Medical History: Past Medical History:  Diagnosis Date  . Anxiety   . AV block, complete (HCC)   . History of seasonal allergies   . Psychosis    Surgical History: Past Surgical History:  Procedure Laterality Date  . COLONOSCOPY N/A 09/28/2015   Procedure: COLONOSCOPY;  Surgeon: Danie Binder, MD;  [facility];  Service: Endoscopy;  Laterality: N/A;  1030  . EP IMPLANTABLE DEVICE N/A 05/17/2015   Procedure: Pacemaker Implant;  Surgeon: Will Meredith Leeds, MD;  Location: [facility];  Service: Cardiovascular;  Laterality: N/A;  . INSERT / REPLACE / REMOVE PACEMAKER  05/17/2015  . TUBAL LIGATION       ROS     Current Medications:  Current Outpatient Prescriptions  Medication Sig Dispense Refill  . ARIPiprazole (ABILIFY) 2 MG tablet Take 1/2 tab at bed time 30 tablet 3  . Vitamin D, Ergocalciferol, (DRISDOL) 50000 units CAPS capsule Take 1 capsule (50,000 Units total) by mouth every 7 (seven) days. 12 capsule 3   No current facility-administered medications for this visit.    Lab Results:  Recent Results (from the past 8736 hour(s))  POCT urinalysis dipstick   Collection Time: 03/14/15 10:31 AM  Result Value Ref Range   Color, UA gold    Clarity, UA clear    Glucose, UA neg    Bilirubin, UA neg'    Ketones, UA neg    Spec Grav, UA 1.010    Blood, UA large    pH, UA 5.0    Protein, UA neg    Urobilinogen, UA negative    Nitrite, UA neg     Leukocytes, UA moderate (2+) (A) Negative  POCT UA - Microscopic Only   Collection Time: 03/14/15 10:32 AM  Result Value Ref Range   WBC, Ur, HPF, POC 1-3    RBC, urine, microscopic 5-10    Bacteria, U Microscopic occ    Mucus, UA neg    Epithelial cells, urine per micros mod    Crystals, Ur, HPF, POC neg    Casts, Ur, LPF, POC neg    Yeast, UA neg   Pap IG w/ reflex to HPV when ASC-U   Collection Time: 03/14/15 11:09 AM  Result Value Ref Range   DIAGNOSIS: Comment    Specimen adequacy: Comment    CLINICIAN PROVIDED ICD10: Comment    Performed by: Comment    PAP SMEAR COMMENT .    Note: Comment    Test Methodology Comment    PAP REFLEX: Comment   Anemia Profile B   Collection Time: 03/14/15 11:14 AM  Result Value Ref Range   Total Iron Binding Capacity 311 250 - 450 ug/dL   UIBC 254 131 - 425 ug/dL   Iron 57 27 - 159 ug/dL   Iron Saturation 18 15 - 55 %   Ferritin 205 (H) 15 - 150 ng/mL   Vitamin B-12 215 211 - 946 pg/mL   Folate 14.1 >3.0 ng/mL   WBC 7.7 3.4 - 10.8 x10E3/uL   RBC 4.66 3.77 - 5.28 x10E6/uL   Hemoglobin 14.3 11.1 - 15.9 g/dL   Hematocrit 41.3 34.0 - 46.6 %   MCV 89 79 - 97 fL   MCH 30.7 26.6 - 33.0 pg   MCHC 34.6 31.5 - 35.7 g/dL   RDW 14.1 12.3 - 15.4 %   Platelets 295 150 - 379 x10E3/uL   Neutrophils 72 %   Lymphs 21 %   Monocytes 5 %   Eos 1 %   Basos 1 %   Neutrophils Absolute 5.5 1.4 - 7.0 x10E3/uL   Lymphocytes Absolute 1.6 0.7 - 3.1 x10E3/uL   Monocytes Absolute 0.4 0.1 - 0.9 x10E3/uL   EOS (ABSOLUTE) 0.1 0.0 - 0.4 x10E3/uL   Basophils Absolute 0.1 0.0 - 0.2 x10E3/uL   Immature Granulocytes 0 %   Immature Grans (Abs) 0.0 0.0 - 0.1 x10E3/uL   Retic Ct Pct 1.6 0.6 - 2.6 %  CMP14+EGFR   Collection Time: 03/14/15 11:14 AM  Result Value Ref Range   Glucose 80 65 - 99 mg/dL   BUN 9 6 - 24 mg/dL   Creatinine, Ser 0.75 0.57 - 1.00 mg/dL   GFR calc non Af Amer 90 >  59 mL/min/1.73   GFR calc Af Amer 104 >59 mL/min/1.73   BUN/Creatinine Ratio  12 9 - 23   Sodium 140 134 - 144 mmol/L   Potassium 4.7 3.5 - 5.2 mmol/L   Chloride 101 96 - 106 mmol/L   CO2 21 18 - 29 mmol/L   Calcium 9.6 8.7 - 10.2 mg/dL   Total Protein 6.9 6.0 - 8.5 g/dL   Albumin 4.5 3.5 - 5.5 g/dL   Globulin, Total 2.4 1.5 - 4.5 g/dL   Albumin/Globulin Ratio 1.9 1.1 - 2.5   Bilirubin Total 0.4 0.0 - 1.2 mg/dL   Alkaline Phosphatase 78 39 - 117 IU/L   AST 22 0 - 40 IU/L   ALT 30 0 - 32 IU/L  Hepatitis C antibody   Collection Time: 03/14/15 11:14 AM  Result Value Ref Range   Hep C Virus Ab <0.1 0.0 - 0.9 s/co ratio  VITAMIN D 25 Hydroxy (Vit-D Deficiency, Fractures)   Collection Time: 03/14/15 11:14 AM  Result Value Ref Range   Vit D, 25-Hydroxy 10.8 (L) 30.0 - 100.0 ng/mL  Thyroid Panel With TSH   Collection Time: 03/14/15 11:14 AM  Result Value Ref Range   TSH 2.730 0.450 - 4.500 uIU/mL   T4, Total 11.0 4.5 - 12.0 ug/dL   T3 Uptake Ratio 24 24 - 39 %   Free Thyroxine Index 2.6 1.2 - 4.9  Lipid panel   Collection Time: 03/14/15 11:14 AM  Result Value Ref Range   Cholesterol, Total 180 100 - 199 mg/dL   Triglycerides 103 0 - 149 mg/dL   HDL 62 >39 mg/dL   VLDL Cholesterol Cal 21 5 - 40 mg/dL   LDL Calculated 97 0 - 99 mg/dL   Chol/HDL Ratio 2.9 0.0 - 4.4 ratio units  Veritor Flu A/B Waived   Collection Time: 05/07/15  9:52 AM  Result Value Ref Range   Influenza A Positive (A) Negative   Influenza B Negative Negative  Basic metabolic panel   Collection Time: 05/14/15  4:07 PM  Result Value Ref Range   Sodium 141 135 - 146 mmol/L   Potassium 4.6 3.5 - 5.3 mmol/L   Chloride 106 98 - 110 mmol/L   CO2 25 20 - 31 mmol/L   Glucose, Bld 81 65 - 99 mg/dL   BUN 14 7 - 25 mg/dL   Creat 0.92 0.50 - 1.05 mg/dL   Calcium 9.2 8.6 - 10.4 mg/dL  CBC w/Diff   Collection Time: 05/14/15  4:07 PM  Result Value Ref Range   WBC 9.3 4.0 - 10.5 K/uL   RBC 4.76 3.87 - 5.11 MIL/uL   Hemoglobin 13.9 12.0 - 15.0 g/dL   HCT 41.7 36.0 - 46.0 %   MCV 87.6 78.0 -  100.0 fL   MCH 29.2 26.0 - 34.0 pg   MCHC 33.3 30.0 - 36.0 g/dL   RDW 13.8 11.5 - 15.5 %   Platelets 330 150 - 400 K/uL   MPV 11.2 8.6 - 12.4 fL   Neutrophils Relative % 63 43 - 77 %   Neutro Abs 5.9 1.7 - 7.7 K/uL   Lymphocytes Relative 28 12 - 46 %   Lymphs Abs 2.6 0.7 - 4.0 K/uL   Monocytes Relative 8 3 - 12 %   Monocytes Absolute 0.7 0.1 - 1.0 K/uL   Eosinophils Relative 1 0 - 5 %   Eosinophils Absolute 0.1 0.0 - 0.7 K/uL   Basophils Relative 0 0 - 1 %  Basophils Absolute 0.0 0.0 - 0.1 K/uL   Smear Review Criteria for review not met   Surgical pcr screen   Collection Time: 05/17/15 10:33 AM  Result Value Ref Range   MRSA, PCR NEGATIVE NEGATIVE   Staphylococcus aureus NEGATIVE NEGATIVE  Urinalysis, Routine w reflex microscopic (not at [facility])   Collection Time: 05/18/15  8:39 AM  Result Value Ref Range   Color, Urine YELLOW YELLOW   APPearance CLEAR CLEAR   Specific Gravity, Urine 1.006 1.005 - 1.030   pH 6.5 5.0 - 8.0   Glucose, UA NEGATIVE NEGATIVE mg/dL   Hgb urine dipstick NEGATIVE NEGATIVE   Bilirubin Urine NEGATIVE NEGATIVE   Ketones, ur NEGATIVE NEGATIVE mg/dL   Protein, ur NEGATIVE NEGATIVE mg/dL   Nitrite NEGATIVE NEGATIVE   Leukocytes, UA TRACE (A) NEGATIVE  Urine microscopic-add on   Collection Time: 05/18/15  8:39 AM  Result Value Ref Range   Squamous Epithelial / LPF 0-5 (A) NONE SEEN   WBC, UA 0-5 0 - 5 WBC/hpf   RBC / HPF NONE SEEN 0 - 5 RBC/hpf   Bacteria, UA RARE (A) NONE SEEN  Implantable device check   Collection Time: 05/30/15  1:44 PM  Result Value Ref Range   Date Time Interrogation Session [id number]    Pulse Generator Manufacturer [employer]    Pulse Gen Model 2272 Assurity MRI    Pulse Gen Serial Number [id number]    Implantable Pulse Generator Type Implantable Pulse Generator    Implantable Pulse Generator Implant Date [id number]    Implantable Lead Manufacturer [facility]    Implantable Lead Model Tendril MRI [id number]    Implantable  Lead Serial Number [id number]    Implantable Lead Implant Date [id number]    Implantable Lead Location [id number]    Implantable Lead Manufacturer [facility]    Implantable Lead Model Tendril MRI V3368683    Implantable Lead Serial Number [id number]    Implantable Lead Implant Date [id number]    Implantable Lead Location [id number]    Lead Channel Setting Sensing Sensitivity 4.0 mV   Lead Channel Setting Pacing Amplitude 3.5 V   Lead Channel Setting Pacing Pulse Width 0.4 ms   Lead Channel Setting Pacing Amplitude 0.625    Lead Channel Impedance Value 525.0 ohm   Lead Channel Sensing Intrinsic Amplitude 4.0 mV   Lead Channel Pacing Threshold Amplitude 0.5 V   Lead Channel Pacing Threshold Pulse Width 0.4 ms   Lead Channel Pacing Threshold Amplitude 0.5 V   Lead Channel Pacing Threshold Pulse Width 0.4 ms   Lead Channel Impedance Value 587.5 ohm   Lead Channel Sensing Intrinsic Amplitude 12.0 mV   Lead Channel Pacing Threshold Amplitude 0.5 V   Lead Channel Pacing Threshold Pulse Width 0.4 ms   Lead Channel Pacing Threshold Amplitude 0.5 V   Lead Channel Pacing Threshold Pulse Width 0.4 ms   Battery Status Unknown    Battery Remaining Longevity 134.4    Battery Voltage 3.01 V   Brady Statistic RA Percent Paced 1.0 %   Brady Statistic RV Percent Paced 98.0 %   Eval Rhythm CHB   Implantable device check   Collection Time: 08/27/15  1:53 PM  Result Value Ref Range   Date Time Interrogation Session [id number]    Pulse Generator Manufacturer [employer]    Pulse Gen Model 2272 Assurity MRI    Pulse Gen Serial Number [id number]    Implantable Pulse Generator Type Implantable Pulse Generator    Implantable Pulse Generator Implant Date [id number]  Implantable Lead Manufacturer [facility]    Implantable Lead Model Tendril MRI [id number]    Implantable Lead Serial Number [id number]    Implantable Lead Implant Date [id number]    Implantable Lead Location [id number]    Implantable Lead Manufacturer [employer]     Implantable Lead Model Tendril MRI [id number]    Implantable Lead Serial Number [id number]    Implantable Lead Implant Date [id number]    Implantable Lead Location [id number]    Lead Channel Setting Sensing Sensitivity 4.0 mV   Lead Channel Setting Pacing Amplitude 2.0 V   Lead Channel Setting Pacing Pulse Width 0.4 ms   Lead Channel Setting Pacing Amplitude 0.75 V   Lead Channel Impedance Value 475.0 ohm   Lead Channel Sensing Intrinsic Amplitude 3.8 mV   Lead Channel Pacing Threshold Amplitude 0.5 V   Lead Channel Pacing Threshold Pulse Width 0.4 ms   Lead Channel Pacing Threshold Amplitude 0.5 V   Lead Channel Pacing Threshold Pulse Width 0.4 ms   Lead Channel Impedance Value 587.5 ohm   Lead Channel Pacing Threshold Amplitude 0.5 V   Lead Channel Pacing Threshold Pulse Width 0.4 ms   Lead Channel Pacing Threshold Amplitude 0.5 V   Lead Channel Pacing Threshold Pulse Width 0.4 ms   Battery Status Unknown    Battery Remaining Longevity 129.6    Battery Voltage 2.99 V   Brady Statistic RA Percent Paced 0.75 %   Brady Statistic RV Percent Paced 96.0 %   Eval Rhythm CHB VP 30   Implantable device - remote   Collection Time: 11/26/15  2:51 PM  Result Value Ref Range   Pulse Generator Manufacturer [employer]    Date Time Interrogation Session [phone]    Pulse Gen Model 2272 Assurity MRI    Pulse Gen Serial Number [id number]    Implantable Pulse Generator Type Implantable Pulse Generator    Implantable Pulse Generator Implant Date [id number]    Implantable Lead Manufacturer [employer]    Implantable Lead Model Tendril MRI [id number]    Implantable Lead Serial Number [id number]    Implantable Lead Implant Date [id number]    Implantable Lead Location [id number]    Implantable Lead Manufacturer [facility]    Implantable Lead Model Tendril MRI [id number]    Implantable Lead Serial Number [id number]    Implantable Lead Implant Date [id number]    Implantable Lead Location [id number]    Lead Channel Impedance Value  480 ohm   Lead Channel Sensing Intrinsic Amplitude 3.8 mV   Lead Channel Pacing Threshold Amplitude 0.5 V   Lead Channel Pacing Threshold Pulse Width 0.4 ms   Lead Channel Impedance Value 590 ohm   Lead Channel Pacing Threshold Amplitude 0.5 V   Lead Channel Pacing Threshold Pulse Width 0.4 ms   Brady Statistic RA Percent Paced 2.4 %   Brady Statistic RV Percent Paced 77 %   Eval Rhythm AS/Vp    Family doctor did blood work last year and it all came back good.  Mental status examination Patient is obese female who is casually dressed and fairly groomed.  She appears to be her stated age.  Her speech is clear and coherent.  Her thought process is  logical.  She denies any active or passive suicidal thoughts homicidal thought.  At this time she denies any visual or auditory hallucination.  She described her mood as good and her affect is mood appropriate.   There were no flight of ideas or any loose association.  Her attention concentration is fair.  [name] oriented x3.  There were no tremors or shakes.  Her insight judgment and impulse control is good.  Diagnosis:   Axis I: Psychotic Disorder NOS Axis II: Deferred Axis III:  Past Medical History  Diagnosis Date  . Anxiety   . Phlebitis   . History of seasonal allergies   . Psychosis    Axis IV: other psychosocial or environmental problems Axis V: 51-60 moderate symptoms  Plan: I will continue Abilify 1 mg at bedtime to prevent recurrence of psychosis  Patient does not want to try higher dose.  [name] much comfortable with the current dose.  She does not have any tremors or shakes.  Recommend to call us back if she has any question or concern.  . She will Follow-up with [place] in 5 months  MEDICATIONS this encounter: Meds ordered this encounter  Medications  . ARIPiprazole (ABILIFY) 2 MG tablet    Sig: Take 1/2 tab at bed time    Dispense:  30 tablet    Refill:  3   Medical Decision Making Problem Points:  Established problem,  stable/improving (1), Review of last therapy session (1) and Review of psycho-social stressors (1) Data Points:  Review or order clinical lab tests (1) Review of medication regiment & side effects (2)   Chanze Teagle, Neoma Laming, MD             Patient ID: Alejandra Harrison, female   DOB: [birth year], 55 y.o.   MRN: [id number] Patient ID: Alejandra Harrison, female   DOB: Mar 29, 1960, 56 y.o.   MRN: [id number] Patient ID: Alejandra Harrison, female   DOB: May 22, 1960, 56 y.o.   MRN: [id number] Patient ID: Alejandra Harrison, female   DOB: 05-14-1960, 56 y.o.   MRN: [id number] Patient ID: Alejandra Harrison, female   DOB: 1961/01/29, 56 y.o.   MRN: [id number] Patient ID: Alejandra Harrison, female   DOB: 04-14-1960, 56 y.o.   MRN: [id number] Patient ID: Alejandra Harrison, female   DOB: 1961-01-08, 56 y.o.   MRN: [id number] Patient ID: Alejandra Harrison, female   DOB: 1960/07/24, 56 y.o.   MRN: [id number] Patient ID: Alejandra Harrison, female   DOB: 30-Sep-1960, 56 y.o.   MRN: [id number] [place] [id number] Progress Note Alejandra Harrison MRN: [id number] DOB: Feb 07, 1961 Age: 55 y.o.  Date: 03/10/2016  Chief Complaint  Patient presents with  . Hallucinations  . Follow-up   History of present illness. As patient is a 56 year old married white female who lives with her husband and 2 sons ages 78 and 27 in 43. 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  The patient states that in 2012 she started seeing a figure that she thought may have been God. She also got fixated on a man in her Sunday school and thought that her husband would die and she would have to marry this man. She was having lots of religious visions. She claims that she had not been on any medication or using drugs or alcohol at that time. She had not had a head injury or any additional stress. She may of been going through menopause. She denied visual hallucinations but claims she felt "high and on top of the world." She's never had a history of depression however.  At any  rate she was seen here by Dr. Toy Cookey and started on Abilify. She was on a higher dose such as 5 mg in the past and it caused akathisia.  Currently she takes 1 mg per day and it seems to be working fairly well. She's no longer having visions or unusual thoughts and she's functioning well in her job. She's not having significant side effects  The patient returns after 5 months. [name] doing well with her pacemaker and has no restrictions. Her energy is good Her mood is good and she denies any hallucinations depression or anxiety.Sometimes she gets anxious and feels a little shaky but she states it doesn't last very long  Allergies: Allergies  Allergen Reactions  . Oxycodone-Acetaminophen Hives and Other (See Comments)    Hands peeled  . Penicillins Hives and Other (See Comments)    Hands peeled   Medical History: Past Medical History:  Diagnosis Date  . Anxiety   . AV block, complete (HCC)   . History of seasonal allergies   . Psychosis    Surgical History: Past Surgical History:  Procedure Laterality Date  . COLONOSCOPY N/A 09/28/2015   Procedure: COLONOSCOPY;  Surgeon: Danie Binder, MD;  [facility];  Service: Endoscopy;  Laterality: N/A;  1030  . EP IMPLANTABLE DEVICE N/A 05/17/2015   Procedure: Pacemaker Implant;  Surgeon: Will Meredith Leeds, MD;  Location: [facility];  Service: Cardiovascular;  Laterality: N/A;  . INSERT / REPLACE / REMOVE PACEMAKER  05/17/2015  . TUBAL LIGATION       ROS     Current Medications: Current Outpatient Prescriptions  Medication Sig Dispense Refill  . ARIPiprazole (ABILIFY) 2 MG tablet Take 1/2 tab at bed time 30 tablet 3  . Vitamin D, Ergocalciferol, (DRISDOL) 50000 units CAPS capsule Take 1 capsule (50,000 Units total) by mouth every 7 (seven) days. 12 capsule 3   No current facility-administered medications for this visit.    Lab Results:  Recent Results (from the past 8736 hour(s))  POCT urinalysis dipstick    Collection Time: 03/14/15 10:31 AM  Result Value Ref Range   Color, UA gold    Clarity, UA clear    Glucose, UA neg    Bilirubin, UA neg'    Ketones, UA neg    Spec Grav, UA 1.010    Blood, UA large    pH, UA 5.0    Protein, UA neg    Urobilinogen, UA negative    Nitrite, UA neg    Leukocytes, UA moderate (2+) (A) Negative  POCT UA - Microscopic Only   Collection Time: 03/14/15 10:32 AM  Result Value Ref Range   WBC, Ur, HPF, POC 1-3    RBC, urine, microscopic 5-10    Bacteria, U Microscopic occ    Mucus, UA neg    Epithelial cells, urine per micros mod    Crystals, Ur, HPF, POC neg    Casts, Ur, LPF, POC neg    Yeast, UA neg   Pap IG w/ reflex to HPV when ASC-U   Collection Time: 03/14/15 11:09 AM  Result Value Ref Range   DIAGNOSIS: Comment    Specimen adequacy: Comment    CLINICIAN PROVIDED ICD10: Comment    Performed by: Comment    PAP SMEAR COMMENT .    Note: Comment    Test Methodology Comment    PAP REFLEX: Comment   Anemia Profile B   Collection Time: 03/14/15 11:14 AM  Result Value Ref Range   Total Iron Binding Capacity 311 250 - 450 ug/dL   UIBC 254 131 - 425 ug/dL   Iron 57 27 - 159 ug/dL  Iron Saturation 18 15 - 55 %   Ferritin 205 (H) 15 - 150 ng/mL   Vitamin B-12 215 211 - 946 pg/mL   Folate 14.1 >3.0 ng/mL   WBC 7.7 3.4 - 10.8 x10E3/uL   RBC 4.66 3.77 - 5.28 x10E6/uL   Hemoglobin 14.3 11.1 - 15.9 g/dL   Hematocrit 41.3 34.0 - 46.6 %   MCV 89 79 - 97 fL   MCH 30.7 26.6 - 33.0 pg   MCHC 34.6 31.5 - 35.7 g/dL   RDW 14.1 12.3 - 15.4 %   Platelets 295 150 - 379 x10E3/uL   Neutrophils 72 %   Lymphs 21 %   Monocytes 5 %   Eos 1 %   Basos 1 %   Neutrophils Absolute 5.5 1.4 - 7.0 x10E3/uL   Lymphocytes Absolute 1.6 0.7 - 3.1 x10E3/uL   Monocytes Absolute 0.4 0.1 - 0.9 x10E3/uL   EOS (ABSOLUTE) 0.1 0.0 - 0.4 x10E3/uL   Basophils Absolute 0.1 0.0 - 0.2 x10E3/uL   Immature Granulocytes 0 %   Immature Grans (Abs) 0.0 0.0 - 0.1 x10E3/uL   Retic Ct  Pct 1.6 0.6 - 2.6 %  CMP14+EGFR   Collection Time: 03/14/15 11:14 AM  Result Value Ref Range   Glucose 80 65 - 99 mg/dL   BUN 9 6 - 24 mg/dL   Creatinine, Ser 0.75 0.57 - 1.00 mg/dL   GFR calc non Af Amer 90 >59 mL/min/1.73   GFR calc Af Amer 104 >59 mL/min/1.73   BUN/Creatinine Ratio 12 9 - 23   Sodium 140 134 - 144 mmol/L   Potassium 4.7 3.5 - 5.2 mmol/L   Chloride 101 96 - 106 mmol/L   CO2 21 18 - 29 mmol/L   Calcium 9.6 8.7 - 10.2 mg/dL   Total Protein 6.9 6.0 - 8.5 g/dL   Albumin 4.5 3.5 - 5.5 g/dL   Globulin, Total 2.4 1.5 - 4.5 g/dL   Albumin/Globulin Ratio 1.9 1.1 - 2.5   Bilirubin Total 0.4 0.0 - 1.2 mg/dL   Alkaline Phosphatase 78 39 - 117 IU/L   AST 22 0 - 40 IU/L   ALT 30 0 - 32 IU/L  Hepatitis C antibody   Collection Time: 03/14/15 11:14 AM  Result Value Ref Range   Hep C Virus Ab <0.1 0.0 - 0.9 s/co ratio  VITAMIN D 25 Hydroxy (Vit-D Deficiency, Fractures)   Collection Time: 03/14/15 11:14 AM  Result Value Ref Range   Vit D, 25-Hydroxy 10.8 (L) 30.0 - 100.0 ng/mL  Thyroid Panel With TSH   Collection Time: 03/14/15 11:14 AM  Result Value Ref Range   TSH 2.730 0.450 - 4.500 uIU/mL   T4, Total 11.0 4.5 - 12.0 ug/dL   T3 Uptake Ratio 24 24 - 39 %   Free Thyroxine Index 2.6 1.2 - 4.9  Lipid panel   Collection Time: 03/14/15 11:14 AM  Result Value Ref Range   Cholesterol, Total 180 100 - 199 mg/dL   Triglycerides 103 0 - 149 mg/dL   HDL 62 >39 mg/dL   VLDL Cholesterol Cal 21 5 - 40 mg/dL   LDL Calculated 97 0 - 99 mg/dL   Chol/HDL Ratio 2.9 0.0 - 4.4 ratio units  Veritor Flu A/B Waived   Collection Time: 05/07/15  9:52 AM  Result Value Ref Range   Influenza A Positive (A) Negative   Influenza B Negative Negative  Basic metabolic panel   Collection Time: 05/14/15  4:07 PM  Result Value Ref  Range   Sodium 141 135 - 146 mmol/L   Potassium 4.6 3.5 - 5.3 mmol/L   Chloride 106 98 - 110 mmol/L   CO2 25 20 - 31 mmol/L   Glucose, Bld 81 65 - 99 mg/dL   BUN  14 7 - 25 mg/dL   Creat 0.92 0.50 - 1.05 mg/dL   Calcium 9.2 8.6 - 10.4 mg/dL  CBC w/Diff   Collection Time: 05/14/15  4:07 PM  Result Value Ref Range   WBC 9.3 4.0 - 10.5 K/uL   RBC 4.76 3.87 - 5.11 MIL/uL   Hemoglobin 13.9 12.0 - 15.0 g/dL   HCT 41.7 36.0 - 46.0 %   MCV 87.6 78.0 - 100.0 fL   MCH 29.2 26.0 - 34.0 pg   MCHC 33.3 30.0 - 36.0 g/dL   RDW 13.8 11.5 - 15.5 %   Platelets 330 150 - 400 K/uL   MPV 11.2 8.6 - 12.4 fL   Neutrophils Relative % 63 43 - 77 %   Neutro Abs 5.9 1.7 - 7.7 K/uL   Lymphocytes Relative 28 12 - 46 %   Lymphs Abs 2.6 0.7 - 4.0 K/uL   Monocytes Relative 8 3 - 12 %   Monocytes Absolute 0.7 0.1 - 1.0 K/uL   Eosinophils Relative 1 0 - 5 %   Eosinophils Absolute 0.1 0.0 - 0.7 K/uL   Basophils Relative 0 0 - 1 %   Basophils Absolute 0.0 0.0 - 0.1 K/uL   Smear Review Criteria for review not met   Surgical pcr screen   Collection Time: 05/17/15 10:33 AM  Result Value Ref Range   MRSA, PCR NEGATIVE NEGATIVE   Staphylococcus aureus NEGATIVE NEGATIVE  Urinalysis, Routine w reflex microscopic (not at [facility])   Collection Time: 05/18/15  8:39 AM  Result Value Ref Range   Color, Urine YELLOW YELLOW   APPearance CLEAR CLEAR   Specific Gravity, Urine 1.006 1.005 - 1.030   pH 6.5 5.0 - 8.0   Glucose, UA NEGATIVE NEGATIVE mg/dL   Hgb urine dipstick NEGATIVE NEGATIVE   Bilirubin Urine NEGATIVE NEGATIVE   Ketones, ur NEGATIVE NEGATIVE mg/dL   Protein, ur NEGATIVE NEGATIVE mg/dL   Nitrite NEGATIVE NEGATIVE   Leukocytes, UA TRACE (A) NEGATIVE  Urine microscopic-add on   Collection Time: 05/18/15  8:39 AM  Result Value Ref Range   Squamous Epithelial / LPF 0-5 (A) NONE SEEN   WBC, UA 0-5 0 - 5 WBC/hpf   RBC / HPF NONE SEEN 0 - 5 RBC/hpf   Bacteria, UA RARE (A) NONE SEEN  Implantable device check   Collection Time: 05/30/15  1:44 PM  Result Value Ref Range   Date Time Interrogation Session [id number]    Pulse Generator Manufacturer [employer]    Pulse Gen  Model 2272 Assurity MRI    Pulse Gen Serial Number [id number]    Implantable Pulse Generator Type Implantable Pulse Generator    Implantable Pulse Generator Implant Date [id number]    Implantable Lead Manufacturer [facility]    Implantable Lead Model Tendril MRI [id number]    Implantable Lead Serial Number [id number]    Implantable Lead Implant Date [id number]    Implantable Lead Location [id number]    Implantable Lead Manufacturer [facility]    Implantable Lead Model Tendril MRI V3368683    Implantable Lead Serial Number [id number]    Implantable Lead Implant Date [id number]    Implantable Lead Location [id number]    Lead Channel Setting Sensing Sensitivity 4.0 mV  Lead Channel Setting Pacing Amplitude 3.5 V   Lead Channel Setting Pacing Pulse Width 0.4 ms   Lead Channel Setting Pacing Amplitude 0.625    Lead Channel Impedance Value 525.0 ohm   Lead Channel Sensing Intrinsic Amplitude 4.0 mV   Lead Channel Pacing Threshold Amplitude 0.5 V   Lead Channel Pacing Threshold Pulse Width 0.4 ms   Lead Channel Pacing Threshold Amplitude 0.5 V   Lead Channel Pacing Threshold Pulse Width 0.4 ms   Lead Channel Impedance Value 587.5 ohm   Lead Channel Sensing Intrinsic Amplitude 12.0 mV   Lead Channel Pacing Threshold Amplitude 0.5 V   Lead Channel Pacing Threshold Pulse Width 0.4 ms   Lead Channel Pacing Threshold Amplitude 0.5 V   Lead Channel Pacing Threshold Pulse Width 0.4 ms   Battery Status Unknown    Battery Remaining Longevity 134.4    Battery Voltage 3.01 V   Brady Statistic RA Percent Paced 1.0 %   Brady Statistic RV Percent Paced 98.0 %   Eval Rhythm CHB   Implantable device check   Collection Time: 08/27/15  1:53 PM  Result Value Ref Range   Date Time Interrogation Session [id number]    Pulse Generator Manufacturer [employer]    Pulse Gen Model 2272 Assurity MRI    Pulse Gen Serial Number [id number]    Implantable Pulse Generator Type Implantable Pulse Generator    Implantable Pulse Generator  Implant Date [id number]    Implantable Lead Manufacturer [facility]    Implantable Lead Model Tendril MRI [id number]    Implantable Lead Serial Number [id number]    Implantable Lead Implant Date [id number]    Implantable Lead Location [id number]    Implantable Lead Manufacturer [facility]    Implantable Lead Model Tendril MRI [id number]    Implantable Lead Serial Number [id number]    Implantable Lead Implant Date [id number]    Implantable Lead Location [id number]    Lead Channel Setting Sensing Sensitivity 4.0 mV   Lead Channel Setting Pacing Amplitude 2.0 V   Lead Channel Setting Pacing Pulse Width 0.4 ms   Lead Channel Setting Pacing Amplitude 0.75 V   Lead Channel Impedance Value 475.0 ohm   Lead Channel Sensing Intrinsic Amplitude 3.8 mV   Lead Channel Pacing Threshold Amplitude 0.5 V   Lead Channel Pacing Threshold Pulse Width 0.4 ms   Lead Channel Pacing Threshold Amplitude 0.5 V   Lead Channel Pacing Threshold Pulse Width 0.4 ms   Lead Channel Impedance Value 587.5 ohm   Lead Channel Pacing Threshold Amplitude 0.5 V   Lead Channel Pacing Threshold Pulse Width 0.4 ms   Lead Channel Pacing Threshold Amplitude 0.5 V   Lead Channel Pacing Threshold Pulse Width 0.4 ms   Battery Status Unknown    Battery Remaining Longevity 129.6    Battery Voltage 2.99 V   Brady Statistic RA Percent Paced 0.75 %   Brady Statistic RV Percent Paced 96.0 %   Eval Rhythm CHB VP 30   Implantable device - remote   Collection Time: 11/26/15  2:51 PM  Result Value Ref Range   Pulse Generator Manufacturer [employer]    Date Time Interrogation Session [phone]    Pulse Gen Model 2272 Assurity MRI    Pulse Gen Serial Number [id number]    Implantable Pulse Generator Type Implantable Pulse Generator    Implantable Pulse Generator Implant Date [id number]    Implantable Lead Manufacturer [employer]    Implantable Lead Model Tendril MRI [id number]    Implantable Lead Serial Number  [id number]    Implantable Lead Implant Date  [id number]    Implantable Lead Location [id number]    Implantable Lead Manufacturer [facility]    Implantable Lead Model Tendril MRI [id number]    Implantable Lead Serial Number [id number]    Implantable Lead Implant Date [id number]    Implantable Lead Location [id number]    Lead Channel Impedance Value 480 ohm   Lead Channel Sensing Intrinsic Amplitude 3.8 mV   Lead Channel Pacing Threshold Amplitude 0.5 V   Lead Channel Pacing Threshold Pulse Width 0.4 ms   Lead Channel Impedance Value 590 ohm   Lead Channel Pacing Threshold Amplitude 0.5 V   Lead Channel Pacing Threshold Pulse Width 0.4 ms   Brady Statistic RA Percent Paced 2.4 %   Brady Statistic RV Percent Paced 77 %   Eval Rhythm AS/Vp    Family doctor did blood work last year and it all came back good.  Mental status examination Patient is obese female who is casually dressed and fairly groomed.  She appears to be her stated age.  Her speech is clear and coherent.  Her thought process is  logical.  She denies any active or passive suicidal thoughts homicidal thought.  At this time she denies any visual or auditory hallucination.  She described her mood as good and her affect is mood appropriate.   There were no flight of ideas or any loose association.  Her attention concentration is fair.  [name] oriented x3.  There were no tremors or shakes.  Her insight judgment and impulse control is good.  Diagnosis:   Axis I: Psychotic Disorder NOS Axis II: Deferred Axis III:  Past Medical History  Diagnosis Date  . Anxiety   . Phlebitis   . History of seasonal allergies   . Psychosis    Axis IV: other psychosocial or environmental problems Axis V: 51-60 moderate symptoms  Plan: I will continue Abilify 1 mg at bedtime to prevent recurrence of psychosis  Patient does not want to try higher dose.  [name] comfortable with the current dose.  She does not have any tremors or shakes.  Recommend to call us back if she has any question or concern.  . She will  Follow-up with [place] in 6 months  MEDICATIONS this encounter: Meds ordered this encounter  Medications  . ARIPiprazole (ABILIFY) 2 MG tablet    Sig: Take 1/2 tab at bed time    Dispense:  30 tablet    Refill:  3   Medical Decision Making Problem Points:  Established problem, stable/improving (1), Review of last therapy session (1) and Review of psycho-social stressors (1) Data Points:  Review or order clinical lab tests (1) Review of medication regiment & side effects (2)   Maesyn Frisinger, MD

## 2016-03-14 ENCOUNTER — Encounter: Payer: Self-pay | Admitting: Family

## 2016-03-14 ENCOUNTER — Ambulatory Visit (INDEPENDENT_AMBULATORY_CARE_PROVIDER_SITE_OTHER): Payer: BC Managed Care – PPO | Admitting: Family

## 2016-03-14 VITALS — BP 132/89 | HR 71 | Temp 98.6°F | Ht 65.0 in | Wt 237.0 lb

## 2016-03-14 DIAGNOSIS — H00014 Hordeolum externum left upper eyelid: Secondary | ICD-10-CM | POA: Diagnosis not present

## 2016-03-14 MED ORDER — BACITRACIN 500 UNIT/GM OP OINT: 1.0000 "application " | TOPICAL_OINTMENT | Freq: Four times a day (QID) | OPHTHALMIC | 0 refills | Status: DC

## 2016-03-14 NOTE — Progress Notes (Signed)
   Subjective:    Patient ID: Alejandra Harrison, female    DOB: 11/01/1960, 56 y.o.   MRN: [id number]  Eye Pain   The right eye is affected. This is a new problem. The current episode started today. The problem occurs constantly. The problem has been unchanged. There was no injury mechanism. The pain is at a severity of 4/10. The pain is mild. Associated symptoms include an eye discharge and eye redness. Pertinent negatives include no blurred vision, double vision, fever, foreign body sensation, itching or photophobia. She has tried nothing for the symptoms. The treatment provided no relief.      Review of Systems  Constitutional: Negative for fever.  Eyes: Positive for pain, discharge and redness. Negative for blurred vision, double vision, photophobia and itching.  All other systems reviewed and are negative.      Objective:   Physical Exam  Constitutional: [name] oriented to person, place, and time. She appears well-developed and well-nourished. No distress.  HENT:  Head: Normocephalic and atraumatic.  Right Ear: External ear normal.  Left Ear: External ear normal.  Nose: Nose normal.  Mouth/Throat: Oropharynx is clear and moist.  Eyes: Right eye exhibits hordeolum (upper lid, swelling present).  Cardiovascular: Normal rate, regular rhythm, normal heart sounds and intact distal pulses.   No murmur heard. Pulmonary/Chest: Effort normal and breath sounds normal. No respiratory distress. She has no wheezes.  Abdominal: Soft. Bowel sounds are normal. She exhibits no distension. There is no tenderness.  Musculoskeletal: Normal range of motion. She exhibits no edema or tenderness.  Neurological: [name] alert and oriented to person, place, and time.  Skin: Skin is warm and dry.  Psychiatric: She has a normal mood and affect. Her behavior is normal. Judgment and thought content normal.  Vitals reviewed.     BP 132/89   Pulse 71   Temp 98.6 F (37 C) (Oral)   Ht 5\' 5"  (1.651 m)   Wt  237 lb (107.5 kg)   BMI 39.44 kg/m      Assessment & Plan:  1. Hordeolum externum of left upper eyelid -Warm compresses -Do not rub eye Good hand hygiene RTO prn - bacitracin ophthalmic ointment; Place 1 application into both eyes 4 (four) times daily. apply to eye  Dispense: 3.5 g; Refill: 0  Jannifer Rodneyhristy Darnel Mchan, FNP

## 2016-03-14 NOTE — Patient Instructions (Signed)

## 2016-03-20 LAB — CUP PACEART REMOTE DEVICE CHECK
Battery Remaining Longevity: 121 mo
Battery Remaining Percentage: 95.5 %
Battery Voltage: 2.98 V
Brady Statistic RA Percent Paced: 2.3 %
Date Time Interrogation Session: 20180110093826
Implantable Lead Implant Date: 20170323
Implantable Lead Location: 753860
Lead Channel Impedance Value: 390 Ohm
Lead Channel Pacing Threshold Amplitude: 0.5 V
Lead Channel Pacing Threshold Pulse Width: 0.4 ms
Lead Channel Pacing Threshold Pulse Width: 0.4 ms
Lead Channel Sensing Intrinsic Amplitude: 4.1 mV
Lead Channel Setting Pacing Amplitude: 2 V
MDC IDC LEAD IMPLANT DT: 20170323
MDC IDC LEAD LOCATION: 753859
MDC IDC MSMT LEADCHNL RV IMPEDANCE VALUE: 580 Ohm
MDC IDC MSMT LEADCHNL RV PACING THRESHOLD AMPLITUDE: 0.625 V
MDC IDC MSMT LEADCHNL RV SENSING INTR AMPL: 12 mV
MDC IDC PG IMPLANT DT: 20170323
MDC IDC SET LEADCHNL RV PACING AMPLITUDE: 0.875
MDC IDC SET LEADCHNL RV PACING PULSEWIDTH: 0.4 ms
MDC IDC SET LEADCHNL RV SENSING SENSITIVITY: 4 mV
MDC IDC STAT BRADY AP VP PERCENT: 2 %
MDC IDC STAT BRADY AP VS PERCENT: 1 %
MDC IDC STAT BRADY AS VP PERCENT: 86 %
MDC IDC STAT BRADY AS VS PERCENT: 11 %
MDC IDC STAT BRADY RV PERCENT PACED: 88 %
Pulse Gen Model: 2272
Pulse Gen Serial Number: 3134337

## 2016-04-11 ENCOUNTER — Encounter: Payer: BC Managed Care – PPO | Admitting: Pediatrics

## 2016-05-13 ENCOUNTER — Encounter: Payer: Self-pay | Admitting: Cardiology

## 2016-05-19 ENCOUNTER — Encounter: Payer: Self-pay | Admitting: Family

## 2016-05-19 ENCOUNTER — Ambulatory Visit (INDEPENDENT_AMBULATORY_CARE_PROVIDER_SITE_OTHER): Payer: BC Managed Care – PPO | Admitting: Family

## 2016-05-19 VITALS — BP 140/88 | HR 72 | Temp 97.3°F | Ht 65.0 in | Wt 237.6 lb

## 2016-05-19 DIAGNOSIS — Z01419 Encounter for gynecological examination (general) (routine) without abnormal findings: Secondary | ICD-10-CM | POA: Diagnosis not present

## 2016-05-19 DIAGNOSIS — E559 Vitamin D deficiency, unspecified: Secondary | ICD-10-CM

## 2016-05-19 DIAGNOSIS — I459 Conduction disorder, unspecified: Secondary | ICD-10-CM

## 2016-05-19 DIAGNOSIS — Z114 Encounter for screening for human immunodeficiency virus [HIV]: Secondary | ICD-10-CM

## 2016-05-19 DIAGNOSIS — K5900 Constipation, unspecified: Secondary | ICD-10-CM

## 2016-05-19 DIAGNOSIS — Z0000 Encounter for general adult medical examination without abnormal findings: Secondary | ICD-10-CM

## 2016-05-19 DIAGNOSIS — K219 Gastro-esophageal reflux disease without esophagitis: Secondary | ICD-10-CM

## 2016-05-19 DIAGNOSIS — B372 Candidiasis of skin and nail: Secondary | ICD-10-CM

## 2016-05-19 DIAGNOSIS — F29 Unspecified psychosis not due to a substance or known physiological condition: Secondary | ICD-10-CM

## 2016-05-19 LAB — URINALYSIS, COMPLETE
Bilirubin, UA: NEGATIVE
Glucose, UA: NEGATIVE
Ketones, UA: NEGATIVE
Nitrite, UA: NEGATIVE
PH UA: 5 (ref 5.0–7.5)
PROTEIN UA: NEGATIVE
Specific Gravity, UA: <1.005 — ABNORMAL LOW (ref 1.005–1.030)
Urobilinogen, Ur: 0.2 mg/dL (ref 0.2–1.0)

## 2016-05-19 LAB — MICROSCOPIC EXAMINATION: RENAL EPITHEL UA: NONE SEEN /HPF

## 2016-05-19 MED ORDER — NYSTATIN 100000 UNIT/GM EX POWD: Freq: Four times a day (QID) | CUTANEOUS | 0 refills | Status: DC

## 2016-05-19 NOTE — Patient Instructions (Signed)
Health Maintenance, Female Adopting a healthy lifestyle and getting preventive care can go a long way to promote health and wellness. Talk with your health care provider about what schedule of regular examinations is right for you. This is a good chance for you to check in with your provider about disease prevention and staying healthy. In between checkups, there are plenty of things you can do on your own. Experts have done a lot of research about which lifestyle changes and preventive measures are most likely to keep you healthy. Ask your health care provider for more information. Weight and diet Eat a healthy diet  Be sure to include plenty of vegetables, fruits, low-fat dairy products, and lean protein.  Do not eat a lot of foods high in solid fats, added sugars, or salt.  Get regular exercise. This is one of the most important things you can do for your health.  Most adults should exercise for at least 150 minutes each week. The exercise should increase your heart rate and make you sweat (moderate-intensity exercise).  Most adults should also do strengthening exercises at least twice a week. This is in addition to the moderate-intensity exercise. Maintain a healthy weight  Body mass index (BMI) is a measurement that can be used to identify possible weight problems. It estimates body fat based on height and weight. Your health care provider can help determine your BMI and help you achieve or maintain a healthy weight.  For females 76 years of age and older:  A BMI below 18.5 is considered underweight.  A BMI of 18.5 to 24.9 is normal.  A BMI of 25 to 29.9 is considered overweight.  A BMI of 30 and above is considered obese. Watch levels of cholesterol and blood lipids  You should start having your blood tested for lipids and cholesterol at 57 years of age, then have this test every 5 years.  You may need to have your cholesterol levels checked more often if:  Your lipid or  cholesterol levels are high.  You are older than 57 years of age.  You are at high risk for heart disease. Cancer screening Lung Cancer  Lung cancer screening is recommended for adults 64-42 years old who are at high risk for lung cancer because of a history of smoking.  A yearly low-dose CT scan of the lungs is recommended for people who:  Currently smoke.  Have quit within the past 15 years.  Have at least a 30-pack-year history of smoking. A pack year is smoking an average of one pack of cigarettes a day for 1 year.  Yearly screening should continue until it has been 15 years since you quit.  Yearly screening should stop if you develop a health problem that would prevent you from having lung cancer treatment. Breast Cancer  Practice breast self-awareness. This means understanding how your breasts normally appear and feel.  It also means doing regular breast self-exams. Let your health care provider know about any changes, no matter how small.  If you are in your 20s or 30s, you should have a clinical breast exam (CBE) by a health care provider every 1-3 years as part of a regular health exam.  If you are 34 or older, have a CBE every year. Also consider having a breast X-ray (mammogram) every year.  If you have a family history of breast cancer, talk to your health care provider about genetic screening.  If you are at high risk for breast cancer, talk  to your health care provider about having an MRI and a mammogram every year.  Breast cancer gene (BRCA) assessment is recommended for women who have family members with BRCA-related cancers. BRCA-related cancers include:  Breast.  Ovarian.  Tubal.  Peritoneal cancers.  Results of the assessment will determine the need for genetic counseling and BRCA1 and BRCA2 testing. Cervical Cancer  Your health care provider may recommend that you be screened regularly for cancer of the pelvic organs (ovaries, uterus, and vagina).  This screening involves a pelvic examination, including checking for microscopic changes to the surface of your cervix (Pap test). You may be encouraged to have this screening done every 3 years, beginning at age 24.  For women ages 66-65, health care providers may recommend pelvic exams and Pap testing every 3 years, or they may recommend the Pap and pelvic exam, combined with testing for human papilloma virus (HPV), every 5 years. Some types of HPV increase your risk of cervical cancer. Testing for HPV may also be done on women of any age with unclear Pap test results.  Other health care providers may not recommend any screening for nonpregnant women who are considered low risk for pelvic cancer and who do not have symptoms. Ask your health care provider if a screening pelvic exam is right for you.  If you have had past treatment for cervical cancer or a condition that could lead to cancer, you need Pap tests and screening for cancer for at least 20 years after your treatment. If Pap tests have been discontinued, your risk factors (such as having a new sexual partner) need to be reassessed to determine if screening should resume. Some women have medical problems that increase the chance of getting cervical cancer. In these cases, your health care provider may recommend more frequent screening and Pap tests. Colorectal Cancer  This type of cancer can be detected and often prevented.  Routine colorectal cancer screening usually begins at 57 years of age and continues through 57 years of age.  Your health care provider may recommend screening at an earlier age if you have risk factors for colon cancer.  Your health care provider may also recommend using home test kits to check for hidden blood in the stool.  A small camera at the end of a tube can be used to examine your colon directly (sigmoidoscopy or colonoscopy). This is done to check for the earliest forms of colorectal cancer.  Routine  screening usually begins at age 41.  Direct examination of the colon should be repeated every 5-10 years through 57 years of age. However, you may need to be screened more often if early forms of precancerous polyps or small growths are found. Skin Cancer  Check your skin from head to toe regularly.  Tell your health care provider about any new moles or changes in moles, especially if there is a change in a mole's shape or color.  Also tell your health care provider if you have a mole that is larger than the size of a pencil eraser.  Always use sunscreen. Apply sunscreen liberally and repeatedly throughout the day.  Protect yourself by wearing long sleeves, pants, a wide-brimmed hat, and sunglasses whenever you are outside. Heart disease, diabetes, and high blood pressure  High blood pressure causes heart disease and increases the risk of stroke. High blood pressure is more likely to develop in:  People who have blood pressure in the high end of the normal range (130-139/85-89 mm Hg).  People who are overweight or obese.  People who are African American.  If you are 59-24 years of age, have your blood pressure checked every 3-5 years. If you are 34 years of age or older, have your blood pressure checked every year. You should have your blood pressure measured twice-once when you are at a hospital or clinic, and once when you are not at a hospital or clinic. Record the average of the two measurements. To check your blood pressure when you are not at a hospital or clinic, you can use:  An automated blood pressure machine at a pharmacy.  A home blood pressure monitor.  If you are between 29 years and 60 years old, ask your health care provider if you should take aspirin to prevent strokes.  Have regular diabetes screenings. This involves taking a blood sample to check your fasting blood sugar level.  If you are at a normal weight and have a low risk for diabetes, have this test once  every three years after 57 years of age.  If you are overweight and have a high risk for diabetes, consider being tested at a younger age or more often. Preventing infection Hepatitis B  If you have a higher risk for hepatitis B, you should be screened for this virus. You are considered at high risk for hepatitis B if:  You were born in a country where hepatitis B is common. Ask your health care provider which countries are considered high risk.  Your parents were born in a high-risk country, and you have not been immunized against hepatitis B (hepatitis B vaccine).  You have HIV or AIDS.  You use needles to inject street drugs.  You live with someone who has hepatitis B.  You have had sex with someone who has hepatitis B.  You get hemodialysis treatment.  You take certain medicines for conditions, including cancer, organ transplantation, and autoimmune conditions. Hepatitis C  Blood testing is recommended for:  Everyone born from [birth year] through 1966.  Anyone with known risk factors for hepatitis C. Sexually transmitted infections (STIs)  You should be screened for sexually transmitted infections (STIs) including gonorrhea and chlamydia if:  You are sexually active and are younger than 57 years of age.  You are older than 57 years of age and your health care provider tells you that you are at risk for this type of infection.  Your sexual activity has changed since you were last screened and you are at an increased risk for chlamydia or gonorrhea. Ask your health care provider if you are at risk.  If you do not have HIV, but are at risk, it may be recommended that you take a prescription medicine daily to prevent HIV infection. This is called pre-exposure prophylaxis (PrEP). You are considered at risk if:  You are sexually active and do not regularly use condoms or know the HIV status of your partner(s).  You take drugs by injection.  You are sexually active with a partner  who has HIV. Talk with your health care provider about whether you are at high risk of being infected with HIV. If you choose to begin PrEP, you should first be tested for HIV. You should then be tested every 3 months for as long as you are taking PrEP. Pregnancy  If you are premenopausal and you may become pregnant, ask your health care provider about preconception counseling.  If you may become pregnant, take 400 to 800 micrograms (mcg) of folic acid  every day.  If you want to prevent pregnancy, talk to your health care provider about birth control (contraception). Osteoporosis and menopause  Osteoporosis is a disease in which the bones lose minerals and strength with aging. This can result in serious bone fractures. Your risk for osteoporosis can be identified using a bone density scan.  If you are 4 years of age or older, or if you are at risk for osteoporosis and fractures, ask your health care provider if you should be screened.  Ask your health care provider whether you should take a calcium or vitamin D supplement to lower your risk for osteoporosis.  Menopause may have certain physical symptoms and risks.  Hormone replacement therapy may reduce some of these symptoms and risks. Talk to your health care provider about whether hormone replacement therapy is right for you. Follow these instructions at home:  Schedule regular health, dental, and eye exams.  Stay current with your immunizations.  Do not use any tobacco products including cigarettes, chewing tobacco, or electronic cigarettes.  If you are pregnant, do not drink alcohol.  If you are breastfeeding, limit how much and how often you drink alcohol.  Limit alcohol intake to no more than 1 drink per day for nonpregnant women. One drink equals 12 ounces of beer, 5 ounces of wine, or 1 ounces of hard liquor.  Do not use street drugs.  Do not share needles.  Ask your health care provider for help if you need support  or information about quitting drugs.  Tell your health care provider if you often feel depressed.  Tell your health care provider if you have ever been abused or do not feel safe at home. This information is not intended to replace advice given to you by your health care provider. Make sure you discuss any questions you have with your health care provider. Document Released: 08/26/2010 Document Revised: 07/19/2015 Document Reviewed: 11/14/2014 Elsevier Interactive Patient Education  2017 [employer].

## 2016-05-19 NOTE — Progress Notes (Signed)
Subjective:    Patient ID: Alejandra Harrison, female    DOB: 15-Aug-1960, 56 y.o.   MRN: [id number]   Pt presents to the office today for CPE with pap. PT goes to [facility] every 6 months for psychosis. Pt is currently taking Abilify for psychosis and hallucinations and states this is working well for her. [name] followed by Cardiologists every 6 months for heart block and has pacemaker in placed. Stable at this time.  Gynecologic Exam  The patient's pertinent negatives include no genital itching, vaginal bleeding or vaginal discharge. Associated symptoms include constipation. Pertinent negatives include no headaches.  Constipation  This is a chronic problem. The current episode started more than 1 year ago. Her stool frequency is 1 time per day. Risk factors include obesity. She has tried laxatives, fiber and diet changes for the symptoms. The treatment provided moderate relief.  Gastroesophageal Reflux  She complains of heartburn. She reports no belching. This is a chronic problem. The problem occurs occasionally. The problem has been waxing and waning. She has tried an antacid for the symptoms. The treatment provided mild relief.      Review of Systems  Constitutional: Negative.   HENT: Negative.   Eyes: Negative.   Respiratory: Negative.  Negative for shortness of breath.   Cardiovascular: Negative.  Negative for palpitations.  Gastrointestinal: Positive for constipation and heartburn.  Endocrine: Negative.   Genitourinary: Negative.  Negative for vaginal discharge.  Musculoskeletal: Negative.   Neurological: Negative.  Negative for headaches.  Hematological: Negative.   Psychiatric/Behavioral: Negative.   All other systems reviewed and are negative.   Social History   Social History  . Marital status: Married    Spouse name: N/A  . Number of children: N/A  . Years of education: N/A   Social History Main Topics  . Smoking status: Never Smoker  . Smokeless tobacco:  Never Used     Comment: Never smoked  . Alcohol use No  . Drug use: No  . Sexual activity: Yes    Birth control/ protection: Post-menopausal   Other Topics Concern  . None   Social History Narrative  . None    Family History  Problem Relation Age of Onset  . Dementia Mother   . Diabetes Father   . Breast cancer Paternal Aunt   . ADD / ADHD Neg Hx   . Alcohol abuse Neg Hx   . Drug abuse Neg Hx   . Anxiety disorder Neg Hx   . Bipolar disorder Neg Hx   . Depression Neg Hx   . OCD Neg Hx   . Paranoid behavior Neg Hx   . Schizophrenia Neg Hx   . Seizures Neg Hx   . Sexual abuse Neg Hx   . Physical abuse Neg Hx   . Colon cancer Neg Hx         Objective:   Physical Exam  Constitutional: [name] oriented to person, place, and time. She appears well-developed and well-nourished. No distress.  HENT:  Head: Normocephalic and atraumatic.  Right Ear: External ear normal.  Left Ear: External ear normal.  Nose: Nose normal.  Mouth/Throat: Oropharynx is clear and moist.  Eyes: Pupils are equal, round, and reactive to light.  Neck: Normal range of motion. Neck supple. No thyromegaly present.  Cardiovascular: Normal rate, regular rhythm, normal heart sounds and intact distal pulses.   No murmur heard. Pulmonary/Chest: Effort normal and breath sounds normal. No respiratory distress. She has no wheezes. Right  breast exhibits no inverted nipple, no mass, no nipple discharge, no skin change and no tenderness. Left breast exhibits no inverted nipple, no mass, no nipple discharge, no skin change and no tenderness. Breasts are symmetrical.  Abdominal: Soft. Bowel sounds are normal. She exhibits no distension. There is no tenderness.  Genitourinary: Vagina normal.  Genitourinary Comments: Bimanual exam- no adnexal masses or tenderness, ovaries nonpalpable   Cervix parous and pink- No discharge   Musculoskeletal: Normal range of motion. She exhibits no edema or tenderness.  Neurological:  [name] alert and oriented to person, place, and time. She has normal reflexes. No cranial nerve deficit.  Skin: Skin is warm and dry. There is erythema (yeast rash present in bilateral groin).  Psychiatric: She has a normal mood and affect. Her behavior is normal. Judgment and thought content normal.  Vitals reviewed.   BP (!) 166/108   Pulse 72   Temp 97.3 F (36.3 C) (Oral)   Ht 5' 5" (1.651 m)   Wt 237 lb 9.6 oz (107.8 kg)   BMI 39.54 kg/m        Assessment & Plan:  1. Gynecologic exam normal - Urinalysis, Complete - CMP14+EGFR - Pap IG w/ reflex to HPV when ASC-U  2. Gastroesophageal reflux disease, esophagitis presence not specified - CMP14+EGFR  3. Vitamin D deficiency - CMP14+EGFR  4. Constipation, unspecified constipation type - CMP14+EGFR  5. Psychosis, unspecified psychosis type - CMP14+EGFR  6. Annual physical exam  - CMP14+EGFR - CBC with Differential/Platelet - Lipid panel - Thyroid Panel With TSH - VITAMIN D 25 Hydroxy (Vit-D Deficiency, Fractures) - Pap IG w/ reflex to HPV when ASC-U  7. Encounter for screening for HIV - HIV antibody  8. Candidiasis of skin  - nystatin (MYCOSTATIN/NYSTOP) powder; Apply topically 4 (four) times daily.  Dispense: 15 g; Refill: 0   Continue all meds and keep follow up with Cardiologists and [facility] pending Health Maintenance reviewed- Will get mammogram scanned into charg- 08/2015 per pt Diet and exercise encouraged RTO 1 year  Evelina Dun, FNP

## 2016-05-20 LAB — CBC WITH DIFFERENTIAL/PLATELET
BASOS: 0 %
Basophils Absolute: 0 10*3/uL (ref 0.0–0.2)
EOS (ABSOLUTE): 0.3 10*3/uL (ref 0.0–0.4)
EOS: 3 %
HEMATOCRIT: 42.3 % (ref 34.0–46.6)
Hemoglobin: 13.9 g/dL (ref 11.1–15.9)
IMMATURE GRANULOCYTES: 0 %
Immature Grans (Abs): 0 10*3/uL (ref 0.0–0.1)
LYMPHS ABS: 1.8 10*3/uL (ref 0.7–3.1)
Lymphs: 21 %
MCH: 28.9 pg (ref 26.6–33.0)
MCHC: 32.9 g/dL (ref 31.5–35.7)
MCV: 88 fL (ref 79–97)
MONOS ABS: 0.6 10*3/uL (ref 0.1–0.9)
Monocytes: 7 %
NEUTROS PCT: 69 %
Neutrophils Absolute: 5.8 10*3/uL (ref 1.4–7.0)
Platelets: 331 10*3/uL (ref 150–379)
RBC: 4.81 x10E6/uL (ref 3.77–5.28)
RDW: 14.8 % (ref 12.3–15.4)
WBC: 8.5 10*3/uL (ref 3.4–10.8)

## 2016-05-20 LAB — LIPID PANEL
CHOL/HDL RATIO: 3 ratio (ref 0.0–4.4)
Cholesterol, Total: 161 mg/dL (ref 100–199)
HDL: 53 mg/dL (ref >39)
LDL CALC: 91 mg/dL (ref 0–99)
Triglycerides: 85 mg/dL (ref 0–149)
VLDL Cholesterol Cal: 17 mg/dL (ref 5–40)

## 2016-05-20 LAB — CMP14+EGFR
ALBUMIN: 4.5 g/dL (ref 3.5–5.5)
ALT: 41 IU/L — ABNORMAL HIGH (ref 0–32)
AST: 21 IU/L (ref 0–40)
Albumin/Globulin Ratio: 1.7 (ref 1.2–2.2)
Alkaline Phosphatase: 92 IU/L (ref 39–117)
BUN / CREAT RATIO: 14 (ref 9–23)
BUN: 10 mg/dL (ref 6–24)
Bilirubin Total: 0.3 mg/dL (ref 0.0–1.2)
CALCIUM: 9.4 mg/dL (ref 8.7–10.2)
CO2: 23 mmol/L (ref 18–29)
CREATININE: 0.71 mg/dL (ref 0.57–1.00)
Chloride: 102 mmol/L (ref 96–106)
GFR calc Af Amer: 109 mL/min/{1.73_m2} (ref >59)
GFR, EST NON AFRICAN AMERICAN: 95 mL/min/{1.73_m2} (ref >59)
GLOBULIN, TOTAL: 2.6 g/dL (ref 1.5–4.5)
Glucose: 85 mg/dL (ref 65–99)
Potassium: 4.7 mmol/L (ref 3.5–5.2)
SODIUM: 141 mmol/L (ref 134–144)
TOTAL PROTEIN: 7.1 g/dL (ref 6.0–8.5)

## 2016-05-20 LAB — THYROID PANEL WITH TSH
FREE THYROXINE INDEX: 2 (ref 1.2–4.9)
T3 Uptake Ratio: 22 % — ABNORMAL LOW (ref 24–39)
T4, Total: 8.9 ug/dL (ref 4.5–12.0)
TSH: 2.71 u[IU]/mL (ref 0.450–4.500)

## 2016-05-20 LAB — HIV ANTIBODY (ROUTINE TESTING W REFLEX): HIV Screen 4th Generation wRfx: NONREACTIVE

## 2016-05-20 LAB — VITAMIN D 25 HYDROXY (VIT D DEFICIENCY, FRACTURES): Vit D, 25-Hydroxy: 18.8 ng/mL — ABNORMAL LOW (ref 30.0–100.0)

## 2016-05-21 LAB — PAP IG W/ RFLX HPV ASCU: PAP Smear Comment: 0

## 2016-05-22 ENCOUNTER — Ambulatory Visit (INDEPENDENT_AMBULATORY_CARE_PROVIDER_SITE_OTHER): Payer: BC Managed Care – PPO | Admitting: Cardiology

## 2016-05-22 ENCOUNTER — Other Ambulatory Visit: Payer: Self-pay | Admitting: Family

## 2016-05-22 VITALS — BP 126/84 | HR 76 | Ht 65.0 in | Wt 237.2 lb

## 2016-05-22 DIAGNOSIS — I442 Atrioventricular block, complete: Secondary | ICD-10-CM | POA: Diagnosis not present

## 2016-05-22 LAB — CUP PACEART INCLINIC DEVICE CHECK
Date Time Interrogation Session: 20180329105247
Implantable Lead Implant Date: 20170323
Implantable Lead Location: 753859
Implantable Pulse Generator Implant Date: 20170323
Lead Channel Impedance Value: 450 Ohm
Lead Channel Impedance Value: 587.5 Ohm
Lead Channel Pacing Threshold Amplitude: 0.625 V
Lead Channel Pacing Threshold Amplitude: 0.75 V
Lead Channel Pacing Threshold Pulse Width: 0.4 ms
Lead Channel Pacing Threshold Pulse Width: 0.4 ms
Lead Channel Setting Pacing Amplitude: 0.875
Lead Channel Setting Pacing Pulse Width: 0.4 ms
Lead Channel Setting Sensing Sensitivity: 4 mV
MDC IDC LEAD IMPLANT DT: 20170323
MDC IDC LEAD LOCATION: 753860
MDC IDC MSMT BATTERY VOLTAGE: 2.98 V
MDC IDC MSMT LEADCHNL RA PACING THRESHOLD AMPLITUDE: 0.75 V
MDC IDC MSMT LEADCHNL RA PACING THRESHOLD PULSEWIDTH: 0.4 ms
MDC IDC MSMT LEADCHNL RA SENSING INTR AMPL: 3.9 mV
MDC IDC MSMT LEADCHNL RV SENSING INTR AMPL: 12 mV
MDC IDC PG SERIAL: 3134337
MDC IDC SET LEADCHNL RA PACING AMPLITUDE: 2 V
MDC IDC STAT BRADY RA PERCENT PACED: 1.8 %
MDC IDC STAT BRADY RV PERCENT PACED: 91 %
Pulse Gen Model: 2272

## 2016-05-22 MED ORDER — VITAMIN D (ERGOCALCIFEROL) 1.25 MG (50000 UNIT) PO CAPS: 50000.0000 [IU] | ORAL_CAPSULE | ORAL | 3 refills | Status: DC

## 2016-05-22 NOTE — Patient Instructions (Signed)
Medication Instructions:  Your physician recommends that you continue on your current medications as directed. Please refer to the Current Medication list given to you today.  * If you need a refill on your cardiac medications before your next appointment, please call your pharmacy.   Labwork: None ordered  Testing/Procedures: None ordered  Follow-Up: Your physician wants you to follow-up in: 1 year with Dr. Camnitz.  You will receive a reminder letter in the mail two months in advance. If you don't receive a letter, please call our office to schedule the follow-up appointment.  Thank you for choosing [facility]!!   Sherri Price, RN [phone]        

## 2016-05-22 NOTE — Progress Notes (Signed)
Electrophysiology Office Note   Date:  05/22/2016   ID:  Alejandra ShuKaren G Laswell, DOB 01/11/1961, MRN [id number]  PCP:  Jannifer Rodneyhristy Hawks, FNP  Primary Electrophysiologist:  Devynne Sturdivant Jorja LoaMartin Abhimanyu Cruces, MD    Chief Complaint  Patient presents with  . Pacemaker Check    Complete heart block     History of Present Illness: Alejandra Harrison is a 56 y.o. female who presents today for electrophysiology evaluation.   Found to be in complete AV block with pacemaker placed 05/17/15. She says that she has not been having any issues.  Today, she denies symptoms of palpitations, chest pain, shortness of breath, orthopnea, PND, lower extremity edema, claudication, presyncope, syncope, bleeding, or neurologic sequela. The patient is tolerating medications without difficulties and is otherwise without complaint today.    Past Medical History:  Diagnosis Date  . Anxiety   . AV block, complete (HCC)   . History of seasonal allergies   . Psychosis    Past Surgical History:  Procedure Laterality Date  . COLONOSCOPY N/A 09/28/2015   Procedure: COLONOSCOPY;  Surgeon: West BaliSandi L Fields, MD;  [facility];  Service: Endoscopy;  Laterality: N/A;  1030  . EP IMPLANTABLE DEVICE N/A 05/17/2015   Procedure: Pacemaker Implant;  Surgeon: Jacquelyn Shadrick Jorja LoaMartin Ameila Weldon, MD;  Location[facility];  Service: Cardiovascular;  Laterality: N/A;  . INSERT / REPLACE / REMOVE PACEMAKER  05/17/2015  . TUBAL LIGATION       Current Outpatient Prescriptions  Medication Sig Dispense Refill  . ARIPiprazole (ABILIFY) 2 MG tablet Take 1/2 tab at bed time 30 tablet 3  . nystatin (MYCOSTATIN/NYSTOP) powder Apply topically 4 (four) times daily. 15 g 0   No current facility-administered medications for this visit.     Allergies:   Oxycodone-acetaminophen and Penicillins   Social History:  The patient  reports that she has never smoked. She has never used smokeless tobacco. She reports that she does not drink alcohol or use drugs.   Family  History:  The patient's family history includes Breast cancer in her paternal aunt; Dementia in her mother; Diabetes in her father.    ROS:  Please see the history of present illness.   Otherwise, review of systems is positive for none.   All other systems are reviewed and negative.    PHYSICAL EXAM: VS:  BP 126/84   Pulse 76   Ht 5\' 5"  (1.651 m)   Wt 237 lb 3.2 oz (107.6 kg)   BMI 39.47 kg/m  , BMI Body mass index is 39.47 kg/m. GEN: Well nourished, well developed, in no acute distress  HEENT: normal  Neck: no JVD, carotid bruits, or masses Cardiac: RRR; no murmurs, rubs, or gallops,no edema  Respiratory:  clear to auscultation bilaterally, normal work of breathing GI: soft, nontender, nondistended, + BS MS: no deformity or atrophy  Skin: warm and dry, device site well healed Neuro:  Strength and sensation are intact Psych: euthymic mood, full affect  EKG:  EKG is ordered today. Personal review of the ekg ordered  shows sinus rhythm, rate 76, V paced  Device interrogation performed today.  Results in PaceArt  Recent Labs: 05/19/2016: ALT 41; BUN 10; Creatinine, Ser 0.71; Platelets 331; Potassium 4.7; Sodium 141; TSH 2.710    Lipid Panel     Component Value Date/Time   CHOL 161 05/19/2016 1037   TRIG 85 05/19/2016 1037   HDL 53 05/19/2016 1037   CHOLHDL 3.0 05/19/2016 1037   LDLCALC 91  05/19/2016 1037     Wt Readings from Last 3 Encounters:  05/22/16 237 lb 3.2 oz (107.6 kg)  05/19/16 237 lb 9.6 oz (107.8 kg)  03/14/16 237 lb (107.5 kg)      ASSESSMENT AND PLAN:  1.  Complete heart block: pacemaker placed 05/17/15. Feeling well today not having any issues with her pacemaker. No changes needed today.Maryclare Labrador continue to monitor with follow-up in 1 year.    Current med no medicines are reviewed at length with the patient today.   The patient does not have concerns regarding her medicines.  The following changes were made today:    Labs/ tests ordered today  include:  No orders of the defined types were placed in this encounter.    Disposition:   FU with Lyanna Blystone 12  months  Signed, Auriah Hollings Jorja Loa, MD  05/22/2016 9:34 AM     [facility] [address] [place] [address] [phone] (office) [phone] (fax)

## 2016-06-23 ENCOUNTER — Telehealth: Payer: Self-pay | Admitting: *Deleted

## 2016-06-23 DIAGNOSIS — I472 Ventricular tachycardia: Secondary | ICD-10-CM

## 2016-06-23 DIAGNOSIS — I4729 Other ventricular tachycardia: Secondary | ICD-10-CM

## 2016-06-23 NOTE — Telephone Encounter (Signed)
Mr. Gage [facility]) made aware that Dr. Elberta Fortis would like to have an echo ordered due to an episode recorded by her PPM. I will have [facility] office call to schedule. Direct # given to device clinic for Mrs. Kesling to call back for further explanation.  21 beats NSVT noted 06/18/16 at 9am.  WC follow up 1-2 weeks post echo.

## 2016-06-25 ENCOUNTER — Telehealth: Payer: Self-pay

## 2016-06-25 NOTE — Telephone Encounter (Signed)
Pre-cert Verification for the following procedure   Echo scheduled for 07/16/16 at [facility]

## 2016-07-02 NOTE — Telephone Encounter (Signed)
Explained arrhythmia, order for echo and follow up to patient. She verbalizes understanding. [facility] and f/u scheduled with Dr. [name].

## 2016-07-16 ENCOUNTER — Other Ambulatory Visit: Payer: Self-pay

## 2016-07-16 ENCOUNTER — Ambulatory Visit (INDEPENDENT_AMBULATORY_CARE_PROVIDER_SITE_OTHER): Payer: BC Managed Care – PPO

## 2016-07-16 DIAGNOSIS — I472 Ventricular tachycardia: Secondary | ICD-10-CM

## 2016-07-16 DIAGNOSIS — I4729 Other ventricular tachycardia: Secondary | ICD-10-CM

## 2016-07-29 ENCOUNTER — Ambulatory Visit (INDEPENDENT_AMBULATORY_CARE_PROVIDER_SITE_OTHER): Payer: BC Managed Care – PPO | Admitting: Cardiology

## 2016-07-29 ENCOUNTER — Encounter: Payer: Self-pay | Admitting: Cardiology

## 2016-07-29 VITALS — BP 130/76 | HR 64 | Ht 65.0 in | Wt 236.2 lb

## 2016-07-29 DIAGNOSIS — I4729 Other ventricular tachycardia: Secondary | ICD-10-CM

## 2016-07-29 DIAGNOSIS — I472 Ventricular tachycardia: Secondary | ICD-10-CM

## 2016-07-29 DIAGNOSIS — I442 Atrioventricular block, complete: Secondary | ICD-10-CM

## 2016-07-29 LAB — CUP PACEART INCLINIC DEVICE CHECK
Brady Statistic RV Percent Paced: 99.44 %
Date Time Interrogation Session: 20180605133514
Implantable Lead Implant Date: 20170323
Implantable Lead Location: 753860
Implantable Pulse Generator Implant Date: 20170323
Lead Channel Impedance Value: 562.5 Ohm
Lead Channel Pacing Threshold Amplitude: 0.625 V
Lead Channel Sensing Intrinsic Amplitude: 4.2 mV
Lead Channel Setting Pacing Amplitude: 2 V
MDC IDC LEAD IMPLANT DT: 20170323
MDC IDC LEAD LOCATION: 753859
MDC IDC MSMT BATTERY REMAINING LONGEVITY: 130 mo
MDC IDC MSMT BATTERY VOLTAGE: 2.98 V
MDC IDC MSMT LEADCHNL RA IMPEDANCE VALUE: 437.5 Ohm
MDC IDC MSMT LEADCHNL RA PACING THRESHOLD AMPLITUDE: 0.5 V
MDC IDC MSMT LEADCHNL RA PACING THRESHOLD PULSEWIDTH: 0.4 ms
MDC IDC MSMT LEADCHNL RV PACING THRESHOLD PULSEWIDTH: 0.4 ms
MDC IDC PG SERIAL: 3134337
MDC IDC SET LEADCHNL RV PACING AMPLITUDE: 0.875
MDC IDC SET LEADCHNL RV PACING PULSEWIDTH: 0.4 ms
MDC IDC SET LEADCHNL RV SENSING SENSITIVITY: 4 mV
MDC IDC STAT BRADY RA PERCENT PACED: 0.73 %
Pulse Gen Model: 2272

## 2016-07-29 MED ORDER — METOPROLOL TARTRATE 25 MG PO TABS: 25.0000 mg | ORAL_TABLET | Freq: Two times a day (BID) | ORAL | 3 refills | Status: DC

## 2016-07-29 NOTE — Patient Instructions (Addendum)
Medication Instructions:    Your physician has recommended you make the following change in your medication:  1) START Metoprolol tartrate 25 mg twice a day  --- If you need a refill on your cardiac medications before your next appointment, please call your pharmacy. ---  Labwork:  None ordered  Testing/Procedures:  None ordered  Follow-Up: Remote monitoring is used to monitor your Pacemaker of ICD from home. This monitoring reduces the number of office visits required to check your device to one time per year. It allows us to keep an eye on the functioning of your device to ensure it is working properly. You are scheduled for a device check from home on 10/28/2016. You may send your transmission at any time that day. If you have a wireless device, the transmission will be sent automatically. After your physician reviews your transmission, you will receive a postcard with your next transmission date.   Your physician wants you to follow-up in: 1 year with Dr. [name].  You will receive a reminder letter in the mail two months in advance. If you don't receive a letter, please call our office to schedule the follow-up appointment.   Any Other Special Instructions Will Be Listed Below (If Applicable).   Thank you for choosing [facility]!!   Dory HornSherri Lillyann Ahart, RN [phone]   Metoprolol tablets What is this medicine? METOPROLOL (me TOE proe lole) is a beta-blocker. Beta-blockers reduce the workload on the heart and help it to beat more regularly. This medicine is used to treat high blood pressure and to prevent chest pain. It is also used to after a heart attack and to prevent an additional heart attack from occurring. This medicine may be used for other purposes; ask your health care provider or pharmacist if you have questions. COMMON BRAND NAME(S): Lopressor What should I tell my health care provider before I take this medicine? They need to know if you have any of these  conditions: -diabetes -heart or vessel disease like slow heart rate, worsening heart failure, heart block, sick sinus syndrome or Raynaud's disease -kidney disease -liver disease -lung or breathing disease, like asthma or emphysema -pheochromocytoma -thyroid disease -an unusual or allergic reaction to metoprolol, other beta-blockers, medicines, foods, dyes, or preservatives -pregnant or trying to get pregnant -breast-feeding How should I use this medicine? Take this medicine by mouth with a drink of water. Follow the directions on the prescription label. Take this medicine immediately after meals. Take your doses at regular intervals. Do not take more medicine than directed. Do not stop taking this medicine suddenly. This could lead to serious heart-related effects. Talk to your pediatrician regarding the use of this medicine in children. Special care may be needed. Overdosage: If you think you have taken too much of this medicine contact a poison control center or emergency room at once. NOTE: This medicine is only for you. Do not share this medicine with others. What if I miss a dose? If you miss a dose, take it as soon as you can. If it is almost time for your next dose, take only that dose. Do not take double or extra doses. What may interact with this medicine? This medicine may interact with the following medications: -certain medicines for blood pressure, heart disease, irregular heart beat -certain medicines for depression like monoamine oxidase (MAO) inhibitors, fluoxetine, or paroxetine -clonidine -dobutamine -epinephrine -isoproterenol -reserpine This list may not describe all possible interactions. Give your health care provider a list of all the medicines,  herbs, non-prescription drugs, or dietary supplements you use. Also tell them if you smoke, drink alcohol, or use illegal drugs. Some items may interact with your medicine. What should I watch for while using this  medicine? Visit your doctor or health care professional for regular check ups. Contact your doctor right away if your symptoms worsen. Check your blood pressure and pulse rate regularly. Ask your health care professional what your blood pressure and pulse rate should be, and when you should contact them. You may get drowsy or dizzy. Do not drive, use machinery, or do anything that needs mental alertness until you know how this medicine affects you. Do not sit or stand up quickly, especially if you are an older patient. This reduces the risk of dizzy or fainting spells. Contact your doctor if these symptoms continue. Alcohol may interfere with the effect of this medicine. Avoid alcoholic drinks. What side effects may I notice from receiving this medicine? Side effects that you should report to your doctor or health care professional as soon as possible: -allergic reactions like skin rash, itching or hives -cold or numb hands or feet -depression -difficulty breathing -faint -fever with sore throat -irregular heartbeat, chest pain -rapid weight gain -swollen legs or ankles Side effects that usually do not require medical attention (report to your doctor or health care professional if they continue or are bothersome): -anxiety or nervousness -change in sex drive or performance -dry skin -headache -nightmares or trouble sleeping -short term memory loss -stomach upset or diarrhea -unusually tired This list may not describe all possible side effects. Call your doctor for medical advice about side effects. You may report side effects to FDA at 1-800-FDA-1088. Where should I keep my medicine? Keep out of the reach of children. Store at room temperature between 15 and 30 degrees C (59 and 86 degrees F). Throw away any unused medicine after the expiration date. NOTE: This sheet is a summary. It may not cover all possible information. If you have questions about this medicine, talk to your doctor,  pharmacist, or health care provider.  2018 Elsevier/Gold Standard (2012-10-15 14:40:36)

## 2016-07-29 NOTE — Progress Notes (Signed)
Electrophysiology Office Note   Date:  07/29/2016   ID:  Alejandra Harrison, DOB 1960/03/26, MRN [id number]  PCP:  Alejandra Spencer, FNP  Primary Electrophysiologist:  Alejandra Lemming, MD    Chief Complaint  Patient presents with  . Pacemaker Check    Complete heart block/NSVT     History of Present Illness: Alejandra Harrison is a 56 y.o. female who presents today for electrophysiology evaluation.   Found to be in complete AV block with pacemaker placed 05/17/15. She says that she has not been having any issues.  Today, denies symptoms of palpitations, chest pain, shortness of breath, orthopnea, PND, lower extremity edema, claudication, dizziness, presyncope, syncope, bleeding, or neurologic sequela. The patient is tolerating medications without difficulties and is otherwise without complaint today.     Past Medical History:  Diagnosis Date  . Anxiety   . AV block, complete (HCC)   . History of seasonal allergies   . Psychosis    Past Surgical History:  Procedure Laterality Date  . COLONOSCOPY N/A 09/28/2015   Procedure: COLONOSCOPY;  Surgeon: Alejandra Bali, MD;  [facility];  Service: Endoscopy;  Laterality: N/A;  1030  . EP IMPLANTABLE DEVICE N/A 05/17/2015   Procedure: Pacemaker Implant;  Surgeon: Alejandra Peraza Jorja Loa, MD;  Location: [facility];  Service: Cardiovascular;  Laterality: N/A;  . INSERT / REPLACE / REMOVE PACEMAKER  05/17/2015  . TUBAL LIGATION       Current Outpatient Prescriptions  Medication Sig Dispense Refill  . ARIPiprazole (ABILIFY) 2 MG tablet Take 1/2 tab at bed time 30 tablet 3  . nystatin (MYCOSTATIN/NYSTOP) powder Apply topically 4 (four) times daily. 15 g 0  . Vitamin D, Ergocalciferol, (DRISDOL) 50000 units CAPS capsule Take 1 capsule (50,000 Units total) by mouth every 7 (seven) days. 12 capsule 3   No current facility-administered medications for this visit.     Allergies:   Oxycodone-acetaminophen and Penicillins   Social  History:  The patient  reports that she has never smoked. She has never used smokeless tobacco. She reports that she does not drink alcohol or use drugs.   Family History:  The patient's family history includes Breast cancer in her paternal aunt; Dementia in her mother; Diabetes in her father.    ROS:  Please see the history of present illness.   Otherwise, review of systems is positive for none.   All other systems are reviewed and negative.     PHYSICAL EXAM: VS:  BP 130/76   Pulse 64   Ht 5\' 5"  (1.651 m)   Wt 236 lb 3.2 oz (107.1 kg)   BMI 39.31 kg/m  , BMI Body mass index is 39.31 kg/m. GEN: Well nourished, well developed, in no acute distress  HEENT: normal  Neck: no JVD, carotid bruits, or masses Cardiac: RRR; no murmurs, rubs, or gallops,no edema  Respiratory:  clear to auscultation bilaterally, normal work of breathing GI: soft, nontender, nondistended, + BS MS: no deformity or atrophy  Skin: warm and dry, device site well healed Neuro:  Strength and sensation are intact Psych: euthymic mood, full affect  EKG:  EKG is not ordered today. Personal review of the ekg ordered 05/22/16 shows sinus rhythm, V pacing   Personal review of the device interrogation today. Results in Paceart  Recent Labs: 05/19/2016: ALT 41; BUN 10; Creatinine, Ser 0.71; Platelets 331; Potassium 4.7; Sodium 141; TSH 2.710    Lipid Panel     Component  Value Date/Time   CHOL 161 05/19/2016 1037   TRIG 85 05/19/2016 1037   HDL 53 05/19/2016 1037   CHOLHDL 3.0 05/19/2016 1037   LDLCALC 91 05/19/2016 1037     Wt Readings from Last 3 Encounters:  07/29/16 236 lb 3.2 oz (107.1 kg)  05/22/16 237 lb 3.2 oz (107.6 kg)  05/19/16 237 lb 9.6 oz (107.8 kg)    TTE 07/16/16 - Left ventricle: The cavity size was normal. Wall thickness was   increased increased in a pattern of mild to moderate LVH.   Systolic function was normal. The estimated ejection fraction was   in the range of 60% to 65%.  Doppler parameters are consistent   with abnormal left ventricular relaxation (grade 1 diastolic   dysfunction). - Aortic valve: Mildly calcified annulus. Trileaflet; normal   thickness leaflets. Valve area (VTI): 2.55 cm^2. Valve area   (Vmax): 2.18 cm^2. Valve area (Vmean): 2.17 cm^2. - Technically adequate study.  ASSESSMENT AND PLAN:  1.  Complete heart block: Pacemaker placed 36/23/17. Patient is feeling well without any issues. No changes made to the pacemaker at this time.  2. Nonsustained ventricular tachycardia: Seen on pacemaker interrogation. Patient was asymptomatic at the time. Had 7 seconds and a 20 beat run. Alejandra Harrison start 25 mg of metoprolol twice a day.   Current med no medicines are reviewed at length with the patient today.   The patient does not have concerns regarding her medicines.  The following changes were made today:  none  Labs/ tests ordered today include:  No orders of the defined types were placed in this encounter.    Disposition:   FU with Alejandra Harrison 12  months  Signed, Alejandra Harrison Jorja Harrison Shandria Clinch, MD  07/29/2016 12:52 PM     [facility] [address] [place] [address] [phone] (office) [phone] (fax)

## 2016-09-01 ENCOUNTER — Other Ambulatory Visit: Payer: Self-pay | Admitting: Internal Medicine

## 2016-09-02 ENCOUNTER — Ambulatory Visit (INDEPENDENT_AMBULATORY_CARE_PROVIDER_SITE_OTHER): Payer: BC Managed Care – PPO | Admitting: Psychiatry

## 2016-09-02 ENCOUNTER — Encounter (HOSPITAL_COMMUNITY): Payer: Self-pay | Admitting: Psychiatry

## 2016-09-02 VITALS — BP 130/84 | HR 66 | Ht 65.0 in | Wt 240.4 lb

## 2016-09-02 DIAGNOSIS — F23 Brief psychotic disorder: Secondary | ICD-10-CM

## 2016-09-02 DIAGNOSIS — F5105 Insomnia due to other mental disorder: Secondary | ICD-10-CM | POA: Diagnosis not present

## 2016-09-02 MED ORDER — ARIPIPRAZOLE 2 MG PO TABS: ORAL_TABLET | ORAL | 3 refills | Status: DC

## 2016-09-02 NOTE — Progress Notes (Signed)
Patient ID: Alejandra Harrison, female   DOB: 1960-06-02, 56 y.o.   MRN: [id number] Patient ID: Alejandra Harrison, female   DOB: 1961/01/14, 56 y.o.   MRN: [id number] Patient ID: Alejandra Harrison, female   DOB: 03-27-1960, 56 y.o.   MRN: [id number] Patient ID: Alejandra Harrison, female   DOB: 06-02-1960, 56 y.o.   MRN: [id number] Patient ID: Alejandra Harrison, female   DOB: [birth year], 56 y.o.   MRN: [id number] Patient ID: Alejandra Harrison, female   DOB: 1960-09-07, 56 y.o.   MRN: [id number] Patient ID: Alejandra Harrison, female   DOB: [birth year], 56 y.o.   MRN: [id number] Patient ID: Alejandra Harrison, female   DOB: 1961/02/22, 56 y.o.   MRN: [id number] Patient ID: Alejandra Harrison, female   DOB: [birth year], 56 y.o.   MRN: [id number] [place] [address] Progress Note Alejandra Harrison MRN: [id number] DOB: Feb 20, 1961 Age: 56 y.o.  Date: 09/02/2016  Chief Complaint  Patient presents with   Hallucinations   Follow-up   History of present illness. As patient is a 56 year old married white female who lives with her husband and 2 sons ages 82 and 18 in 76. 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  The patient states that in 2012 she started seeing a figure that she thought may have been God. She also got fixated on a man in her Sunday school and thought that her husband would die and she would have to marry this man. She was having lots of religious visions. She claims that she had not been on any medication or using drugs or alcohol at that time. She had not had a head injury or any additional stress. She may of been going through menopause. She denied visual hallucinations but claims she felt "high and on top of the world." She's never had a history of depression however.  At any rate she was seen here by Dr. Toy Cookey and started on Abilify. She was on a higher dose such as 5 mg in the past and it caused akathisia. Currently she takes 1 mg per day and it seems to be working fairly well. She's no longer having visions or unusual  thoughts and she's functioning well in her job. She's not having significant side effects  The patient returns after 6 months. [name] doing well with her pacemaker. However she developed some ventricular tachycardia and is now on metoprolol. [name] still working driving a school bus and working in a school [employer]. She enjoys the work most the time. [name] only taking 1 mg of Abilify at bedtime and has not had any further delusions or hallucinations. She denies any current side effects and she is sleeping well  Allergies: Allergies  Allergen Reactions   Oxycodone-Acetaminophen Hives and Other (See Comments)    Hands peeled   Penicillins Hives and Other (See Comments)    Hands peeled   Medical History: Past Medical History:  Diagnosis Date   Anxiety    AV block, complete ([place])    History of seasonal allergies    Psychosis    Surgical History: Past Surgical History:  Procedure Laterality Date   COLONOSCOPY N/A 09/28/2015   Procedure: COLONOSCOPY;  Surgeon: Danie Binder, MD;  [facility];  Service: Endoscopy;  Laterality: N/A;  1030   EP IMPLANTABLE DEVICE N/A 05/17/2015   Procedure: Pacemaker Implant;  Surgeon: Will Meredith Leeds, MD;  Location: [place] [facility];  Service: Cardiovascular;  Laterality: N/A;   INSERT / REPLACE / REMOVE PACEMAKER  05/17/2015   TUBAL LIGATION       ROS     Current Medications: Current Outpatient Prescriptions  Medication Sig Dispense Refill   ARIPiprazole (ABILIFY) 2 MG tablet Take 1/2 tab at bed time 30 tablet 3   metoprolol tartrate (LOPRESSOR) 25 MG tablet Take 1 tablet (25 mg total) by mouth 2 (two) times daily. 180 tablet 3   nystatin (MYCOSTATIN/NYSTOP) powder Apply topically 4 (four) times daily. 15 g 0   Vitamin D, Ergocalciferol, (DRISDOL) 50000 units CAPS capsule Take 1 capsule (50,000 Units total) by mouth every 7 (seven) days. 12 capsule 3   No current facility-administered medications for this visit.     Lab Results:  Results for orders placed or performed in visit on 07/29/16 (from the past 8736 hour(s))  CUP PACEART [facility] DEVICE CHECK   Collection Time: 07/29/16  5:29 PM  Result Value Ref Range   Date Time Interrogation Session [id number]    Pulse Generator Manufacturer [employer]    Pulse Gen Model 2272 Assurity MRI    Pulse Gen Serial Number [id number]    Clinic Name [place]    Implantable Pulse Generator Type Implantable Pulse Generator    Implantable Pulse Generator Implant Date 28413244    Implantable Lead Manufacturer [facility]    Implantable Lead Model Tendril MRI [id number]    Implantable Lead Serial Number [id number]    Implantable Lead Implant Date 01027253    Implantable Lead Location Detail 1 UNKNOWN    Implantable Lead Location [id number]    Implantable Lead Manufacturer [facility]    Implantable Lead Model Tendril MRI [id number]    Implantable Lead Serial Number [id number]    Implantable Lead Implant Date 66440347    Implantable Lead Location Detail 1 UNKNOWN    Implantable Lead Location [id number]    Lead Channel Setting Sensing Sensitivity 4.0 mV   Lead Channel Setting Pacing Amplitude 2.0 V   Lead Channel Setting Pacing Pulse Width 0.4 ms   Lead Channel Setting Pacing Amplitude 0.875    Lead Channel Impedance Value 437.5 ohm   Lead Channel Sensing Intrinsic Amplitude 4.2 mV   Lead Channel Pacing Threshold Amplitude 0.5 V   Lead Channel Pacing Threshold Pulse Width 0.4 ms   Lead Channel Impedance Value 562.5 ohm   Lead Channel Pacing Threshold Amplitude 0.625 V   Lead Channel Pacing Threshold Pulse Width 0.4 ms   Battery Status Unknown    Battery Remaining Longevity 130 mo   Battery Voltage 2.98 V   Brady Statistic RA Percent Paced 0.73 %   Brady Statistic RV Percent Paced 99.44 %   Eval Rhythm AS,VP @ 30   Results for orders placed or performed in visit on 05/22/16 (from the past 8736 hour(s))  CUP PACEART [facility] DEVICE CHECK   Collection Time: 05/22/16  2:45 PM   Result Value Ref Range   Date Time Interrogation Session [id number]    Pulse Generator Manufacturer [employer]    Pulse Gen Model 2272 Assurity MRI    Pulse Gen Serial Number [id number]    Clinic Name [place]    Implantable Pulse Generator Type Implantable Pulse Generator    Implantable Pulse Generator Implant Date 16606301    Implantable Lead Manufacturer [facility]    Implantable Lead Model Tendril MRI V3368683    Implantable Lead Serial Number [id number]    Implantable Lead Implant Date 60109323    Implantable Lead Location Detail 1  UNKNOWN    Implantable Lead Location [id number]    Implantable Lead Manufacturer [facility]    Implantable Lead Model Tendril MRI V3368683    Implantable Lead Serial Number [id number]    Implantable Lead Implant Date 98338250    Implantable Lead Location Detail 1 UNKNOWN    Implantable Lead Location [id number]    Lead Channel Setting Sensing Sensitivity 4.0 mV   Lead Channel Setting Pacing Amplitude 2.0 V   Lead Channel Setting Pacing Pulse Width 0.4 ms   Lead Channel Setting Pacing Amplitude 0.875    Lead Channel Impedance Value 450.0 ohm   Lead Channel Sensing Intrinsic Amplitude 3.9 mV   Lead Channel Pacing Threshold Amplitude 0.75 V   Lead Channel Pacing Threshold Pulse Width 0.4 ms   Lead Channel Pacing Threshold Amplitude 0.75 V   Lead Channel Pacing Threshold Pulse Width 0.4 ms   Lead Channel Impedance Value 587.5 ohm   Lead Channel Sensing Intrinsic Amplitude 12.0 mV   Lead Channel Pacing Threshold Amplitude 0.625 V   Lead Channel Pacing Threshold Pulse Width 0.4 ms   Battery Status Unknown    Battery Voltage 2.98 V   Brady Statistic RA Percent Paced 1.8 %   Brady Statistic RV Percent Paced 91.0 %   Eval Rhythm CHB 40BPM   Results for orders placed or performed in visit on 05/19/16 (from the past 8736 hour(s))  Pap IG w/ reflex to HPV when ASC-U   Collection Time: 05/19/16 10:32 AM  Result Value Ref Range   DIAGNOSIS: Comment    Specimen adequacy:  Comment    Clinician Provided ICD10 Comment    Performed by: Comment    PAP Smear Comment .    Note: Comment    Test Methodology Comment    PAP Reflex Comment   Microscopic Examination   Collection Time: 05/19/16 10:37 AM  Result Value Ref Range   WBC, UA 6-10 (A) 0 - 5 /hpf   RBC, UA 0-2 0 - 2 /hpf   Epithelial Cells (non renal) 0-10 0 - 10 /hpf   Renal Epithel, UA None seen None seen /hpf   Bacteria, UA Few None seen/Few  Urinalysis, Complete   Collection Time: 05/19/16 10:37 AM  Result Value Ref Range   Specific Gravity, UA <1.005 (L) 1.005 - 1.030   pH, UA 5.0 5.0 - 7.5   Color, UA Yellow Yellow   Appearance Ur Clear Clear   Leukocytes, UA 1+ (A) Negative   Protein, UA Negative Negative/Trace   Glucose, UA Negative Negative   Ketones, UA Negative Negative   RBC, UA Trace (A) Negative   Bilirubin, UA Negative Negative   Urobilinogen, Ur 0.2 0.2 - 1.0 mg/dL   Nitrite, UA Negative Negative   Microscopic Examination See below:   CMP14+EGFR   Collection Time: 05/19/16 10:37 AM  Result Value Ref Range   Glucose 85 65 - 99 mg/dL   BUN 10 6 - 24 mg/dL   Creatinine, Ser 0.71 0.57 - 1.00 mg/dL   GFR calc non Af Amer 95 >59 mL/min/1.73   GFR calc Af Amer 109 >59 mL/min/1.73   BUN/Creatinine Ratio 14 9 - 23   Sodium 141 134 - 144 mmol/L   Potassium 4.7 3.5 - 5.2 mmol/L   Chloride 102 96 - 106 mmol/L   CO2 23 18 - 29 mmol/L   Calcium 9.4 8.7 - 10.2 mg/dL   Total Protein 7.1 6.0 - 8.5 g/dL   Albumin 4.5 3.5 - 5.5 g/dL  Globulin, Total 2.6 1.5 - 4.5 g/dL   Albumin/Globulin Ratio 1.7 1.2 - 2.2   Bilirubin Total 0.3 0.0 - 1.2 mg/dL   Alkaline Phosphatase 92 39 - 117 IU/L   AST 21 0 - 40 IU/L   ALT 41 (H) 0 - 32 IU/L  CBC with Differential/Platelet   Collection Time: 05/19/16 10:37 AM  Result Value Ref Range   WBC 8.5 3.4 - 10.8 x10E3/uL   RBC 4.81 3.77 - 5.28 x10E6/uL   Hemoglobin 13.9 11.1 - 15.9 g/dL   Hematocrit 42.3 34.0 - 46.6 %   MCV 88 79 - 97 fL   MCH 28.9  26.6 - 33.0 pg   MCHC 32.9 31.5 - 35.7 g/dL   RDW 14.8 12.3 - 15.4 %   Platelets 331 150 - 379 x10E3/uL   Neutrophils 69 Not Estab. %   Lymphs 21 Not Estab. %   Monocytes 7 Not Estab. %   Eos 3 Not Estab. %   Basos 0 Not Estab. %   Neutrophils Absolute 5.8 1.4 - 7.0 x10E3/uL   Lymphocytes Absolute 1.8 0.7 - 3.1 x10E3/uL   Monocytes Absolute 0.6 0.1 - 0.9 x10E3/uL   EOS (ABSOLUTE) 0.3 0.0 - 0.4 x10E3/uL   Basophils Absolute 0.0 0.0 - 0.2 x10E3/uL   Immature Granulocytes 0 Not Estab. %   Immature Grans (Abs) 0.0 0.0 - 0.1 x10E3/uL  Lipid panel   Collection Time: 05/19/16 10:37 AM  Result Value Ref Range   Cholesterol, Total 161 100 - 199 mg/dL   Triglycerides 85 0 - 149 mg/dL   HDL 53 >39 mg/dL   VLDL Cholesterol Cal 17 5 - 40 mg/dL   LDL Calculated 91 0 - 99 mg/dL   Chol/HDL Ratio 3.0 0.0 - 4.4 ratio units  Thyroid Panel With TSH   Collection Time: 05/19/16 10:37 AM  Result Value Ref Range   TSH 2.710 0.450 - 4.500 uIU/mL   T4, Total 8.9 4.5 - 12.0 ug/dL   T3 Uptake Ratio 22 (L) 24 - 39 %   Free Thyroxine Index 2.0 1.2 - 4.9  VITAMIN D 25 Hydroxy (Vit-D Deficiency, Fractures)   Collection Time: 05/19/16 10:37 AM  Result Value Ref Range   Vit D, 25-Hydroxy 18.8 (L) 30.0 - 100.0 ng/mL  HIV antibody   Collection Time: 05/19/16 10:37 AM  Result Value Ref Range   HIV Screen 4th Generation wRfx Non Reactive Non Reactive  Results for orders placed or performed in visit on 03/05/16 (from the past 8736 hour(s))  CUP PACEART REMOTE DEVICE CHECK   Collection Time: 03/05/16  9:38 AM  Result Value Ref Range   Date Time Interrogation Session [id number]    Pulse Generator Manufacturer [employer]    Pulse Gen Model 2272 Assurity MRI    Pulse Gen Serial Number [id number]    Clinic Name [place]    Implantable Pulse Generator Type Implantable Pulse Generator    Implantable Pulse Generator Implant Date 96789381    Implantable Lead Manufacturer [facility]    Implantable Lead Model  Tendril MRI V3368683    Implantable Lead Serial Number [id number]    Implantable Lead Implant Date 01751025    Implantable Lead Location Detail 1 UNKNOWN    Implantable Lead Location [id number]    Implantable Lead Manufacturer [facility]    Implantable Lead Model Tendril MRI V3368683    Implantable Lead Serial Number [id number]    Implantable Lead Implant Date 85277824    Implantable Lead Location Detail 1 UNKNOWN  Implantable Lead Location [phone]    Lead Channel Setting Sensing Sensitivity 4.0 mV   Lead Channel Setting Sensing Adaptation Mode Fixed Pacing    Lead Channel Setting Pacing Amplitude 2.0 V   Lead Channel Setting Pacing Pulse Width 0.4 ms   Lead Channel Setting Pacing Amplitude 0.875    Lead Channel Status     Lead Channel Impedance Value 390 ohm   Lead Channel Sensing Intrinsic Amplitude 4.1 mV   Lead Channel Pacing Threshold Amplitude 0.5 V   Lead Channel Pacing Threshold Pulse Width 0.4 ms   Lead Channel Status     Lead Channel Impedance Value 580 ohm   Lead Channel Sensing Intrinsic Amplitude 12.0 mV   Lead Channel Pacing Threshold Amplitude 0.625 V   Lead Channel Pacing Threshold Pulse Width 0.4 ms   Battery Status MOS    Battery Remaining Longevity 121 mo   Battery Remaining Percentage 95.5 %   Battery Voltage 2.98 V   Brady Statistic RA Percent Paced 2.3 %   Brady Statistic RV Percent Paced 88.0 %   Brady Statistic AP VP Percent 2.0 %   Brady Statistic AS VP Percent 86.0 %   Brady Statistic AP VS Percent 1.0 %   Brady Statistic AS VS Percent 11.0 %   Eval Rhythm AS,VP   Results for orders placed or performed in visit on 11/26/15 (from the past 8736 hour(s))  Implantable device - remote   Collection Time: 11/26/15  2:51 PM  Result Value Ref Range   Pulse Generator Manufacturer [employer]    Date Time Interrogation Session [phone]    Pulse Gen Model 2272 Assurity MRI    Pulse Gen Serial Number [id number]    Implantable Pulse Generator Type Implantable Pulse  Generator    Implantable Pulse Generator Implant Date [id number]    Implantable Lead Manufacturer [facility]    Implantable Lead Model Tendril MRI V3368683    Implantable Lead Serial Number [id number]    Implantable Lead Implant Date 25852778    Implantable Lead Location [id number]    Implantable Lead Manufacturer [facility]    Implantable Lead Model Tendril MRI V3368683    Implantable Lead Serial Number [id number]    Implantable Lead Implant Date 24235361    Implantable Lead Location [id number]    Lead Channel Impedance Value 480 ohm   Lead Channel Sensing Intrinsic Amplitude 3.8 mV   Lead Channel Pacing Threshold Amplitude 0.5 V   Lead Channel Pacing Threshold Pulse Width 0.4 ms   Lead Channel Impedance Value 590 ohm   Lead Channel Pacing Threshold Amplitude 0.5 V   Lead Channel Pacing Threshold Pulse Width 0.4 ms   Brady Statistic RA Percent Paced 2.4 %   Brady Statistic RV Percent Paced 77 %   Eval Rhythm AS/Vp    Family doctor did blood work last year and it all came back good.  Mental status examination Patient is obese female who is casually dressed and fairly groomed.  She appears to be her stated age.  Her speech is clear and coherent.  Her thought process is  logical.  She denies any active or passive suicidal thoughts homicidal thought.  At this time she denies any visual or auditory hallucination.  She described her mood as good and her affect is mood appropriate.   There were no flight of ideas or any loose association.  Her attention concentration is fair.  [name] oriented x3.  There were no tremors or shakes.  Her insight judgment and impulse control  is good.  Diagnosis:   Axis I: Psychotic Disorder NOS Axis II: Deferred Axis III:  Past Medical History  Diagnosis Date   Anxiety    Phlebitis    History of seasonal allergies    Psychosis    Axis IV: other psychosocial or environmental problems Axis V: 51-60 moderate symptoms  Plan: I will continue Abilify 1 mg at bedtime  to prevent recurrence of psychosis  Patient does not want to try higher dose.  [name] much comfortable with the current dose.  She does not have any tremors or shakes.  Recommend to call us back if she has any question or concern.  . She will Follow-up with [place] in 6 months  MEDICATIONS this encounter: Meds ordered this encounter  Medications   ARIPiprazole (ABILIFY) 2 MG tablet    Sig: Take 1/2 tab at bed time    Dispense:  30 tablet    Refill:  3   Medical Decision Making Problem Points:  Established problem, stable/improving (1), Review of last therapy session (1) and Review of psycho-social stressors (1) Data Points:  Review or order clinical lab tests (1) Review of medication regiment & side effects (2)   Mikaili Flippin, Neoma Laming, MD             Patient ID: Shelda Pal, female   DOB: 1960/12/13, 56 y.o.   MRN: [id number] Patient ID: AFSHEEN ANTONY, female   DOB: 10-13-1960, 56 y.o.   MRN: [id number] Patient ID: ELISHIA KACZOROWSKI, female   DOB: 1961-01-10, 56 y.o.   MRN: [id number] Patient ID: KAYTLYNN KOCHAN, female   DOB: December 24, 1960, 56 y.o.   MRN: [id number] Patient ID: KAJAL SCALICI, female   DOB: 1960-12-21, 56 y.o.   MRN: [id number] Patient ID: KESHA HURRELL, female   DOB: 05-Nov-1960, 56 y.o.   MRN: [id number] Patient ID: ZYKIRA MATLACK, female   DOB: January 16, 1961, 56 y.o.   MRN: [id number] Patient ID: SKIE VITRANO, female   DOB: Nov 01, 1960, 56 y.o.   MRN: [id number] Patient ID: YONEKO TALERICO, female   DOB: [birth year], 56 y.o.   MRN: [id number] [place] [address] Progress Note Alejandra Harrison MRN: [id number] DOB: 1960/06/20 Age: 25 y.o.  Date: 09/02/2016  Chief Complaint  Patient presents with   Hallucinations   Follow-up   History of present illness. As patient is a 56 year old married white female who lives with her husband and 2 sons ages 76 and 11 in 71. 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  The patient states that in 2012 she started seeing a figure that she  thought may have been God. She also got fixated on a man in her Sunday school and thought that her husband would die and she would have to marry this man. She was having lots of religious visions. She claims that she had not been on any medication or using drugs or alcohol at that time. She had not had a head injury or any additional stress. She may of been going through menopause. She denied visual hallucinations but claims she felt "high and on top of the world." She's never had a history of depression however.  At any rate she was seen here by Dr. Toy Cookey and started on Abilify. She was on a higher dose such as 5 mg in the past and it caused akathisia. Currently she takes 1 mg per day and it seems to be working fairly well. She's no longer  having visions or unusual thoughts and she's functioning well in her job. She's not having significant side effects  The patient returns after 5 months. [name] doing well with her pacemaker and has no restrictions. Her energy is good Her mood is good and she denies any hallucinations depression or anxiety.Sometimes she gets anxious and feels a little shaky but she states it doesn't last very long  Allergies: Allergies  Allergen Reactions   Oxycodone-Acetaminophen Hives and Other (See Comments)    Hands peeled   Penicillins Hives and Other (See Comments)    Hands peeled   Medical History: Past Medical History:  Diagnosis Date   Anxiety    AV block, complete ([place])    History of seasonal allergies    Psychosis    Surgical History: Past Surgical History:  Procedure Laterality Date   COLONOSCOPY N/A 09/28/2015   Procedure: COLONOSCOPY;  Surgeon: Danie Binder, MD;  [facility];  Service: Endoscopy;  Laterality: N/A;  1030   EP IMPLANTABLE DEVICE N/A 05/17/2015   Procedure: Pacemaker Implant;  Surgeon: Will Meredith Leeds, MD;  Location: [place] [facility] LAB;  Service: Cardiovascular;  Laterality: N/A;   INSERT / REPLACE / REMOVE PACEMAKER   05/17/2015   TUBAL LIGATION       ROS     Current Medications: Current Outpatient Prescriptions  Medication Sig Dispense Refill   ARIPiprazole (ABILIFY) 2 MG tablet Take 1/2 tab at bed time 30 tablet 3   metoprolol tartrate (LOPRESSOR) 25 MG tablet Take 1 tablet (25 mg total) by mouth 2 (two) times daily. 180 tablet 3   nystatin (MYCOSTATIN/NYSTOP) powder Apply topically 4 (four) times daily. 15 g 0   Vitamin D, Ergocalciferol, (DRISDOL) 50000 units CAPS capsule Take 1 capsule (50,000 Units total) by mouth every 7 (seven) days. 12 capsule 3   No current facility-administered medications for this visit.    Lab Results:  Results for orders placed or performed in visit on 07/29/16 (from the past 8736 hour(s))  CUP PACEART [facility] DEVICE CHECK   Collection Time: 07/29/16  5:29 PM  Result Value Ref Range   Date Time Interrogation Session [id number]    Pulse Generator Manufacturer [employer]    Pulse Gen Model 2272 Assurity MRI    Pulse Gen Serial Number [id number]    Clinic Name [place]    Implantable Pulse Generator Type Implantable Pulse Generator    Implantable Pulse Generator Implant Date 49702637    Implantable Lead Manufacturer [facility]    Implantable Lead Model Tendril MRI V3368683    Implantable Lead Serial Number [id number]    Implantable Lead Implant Date 85885027    Implantable Lead Location Detail 1 UNKNOWN    Implantable Lead Location [id number]    Implantable Lead Manufacturer [facility]    Implantable Lead Model Tendril MRI V3368683    Implantable Lead Serial Number [id number]    Implantable Lead Implant Date 74128786    Implantable Lead Location Detail 1 UNKNOWN    Implantable Lead Location [id number]    Lead Channel Setting Sensing Sensitivity 4.0 mV   Lead Channel Setting Pacing Amplitude 2.0 V   Lead Channel Setting Pacing Pulse Width 0.4 ms   Lead Channel Setting Pacing Amplitude 0.875    Lead Channel Impedance Value 437.5 ohm   Lead Channel Sensing Intrinsic  Amplitude 4.2 mV   Lead Channel Pacing Threshold Amplitude 0.5 V   Lead Channel Pacing Threshold Pulse Width 0.4 ms   Lead Channel Impedance Value 562.5 ohm  Lead Channel Pacing Threshold Amplitude 0.625 V   Lead Channel Pacing Threshold Pulse Width 0.4 ms   Battery Status Unknown    Battery Remaining Longevity 130 mo   Battery Voltage 2.98 V   Brady Statistic RA Percent Paced 0.73 %   Brady Statistic RV Percent Paced 99.44 %   Eval Rhythm AS,VP @ 30   Results for orders placed or performed in visit on 05/22/16 (from the past 8736 hour(s))  CUP PACEART [facility] DEVICE CHECK   Collection Time: 05/22/16  2:45 PM  Result Value Ref Range   Date Time Interrogation Session [id number]    Pulse Generator Manufacturer [employer]    Pulse Gen Model 2272 Assurity MRI    Pulse Gen Serial Number [id number]    Clinic Name [facility]    Implantable Pulse Generator Type Implantable Pulse Generator    Implantable Pulse Generator Implant Date 48312797    Implantable Lead Manufacturer [facility]    Implantable Lead Model Tendril MRI K1472076    Implantable Lead Serial Number [id number]    Implantable Lead Implant Date 53872357    Implantable Lead Location Detail 1 UNKNOWN    Implantable Lead Location [id number]    Implantable Lead Manufacturer [facility]    Implantable Lead Model Tendril MRI K1472076    Implantable Lead Serial Number [id number]    Implantable Lead Implant Date 50268061    Implantable Lead Location Detail 1 UNKNOWN    Implantable Lead Location [id number]    Lead Channel Setting Sensing Sensitivity 4.0 mV   Lead Channel Setting Pacing Amplitude 2.0 V   Lead Channel Setting Pacing Pulse Width 0.4 ms   Lead Channel Setting Pacing Amplitude 0.875    Lead Channel Impedance Value 450.0 ohm   Lead Channel Sensing Intrinsic Amplitude 3.9 mV   Lead Channel Pacing Threshold Amplitude 0.75 V   Lead Channel Pacing Threshold Pulse Width 0.4 ms   Lead Channel Pacing Threshold Amplitude 0.75 V   Lead Channel  Pacing Threshold Pulse Width 0.4 ms   Lead Channel Impedance Value 587.5 ohm   Lead Channel Sensing Intrinsic Amplitude 12.0 mV   Lead Channel Pacing Threshold Amplitude 0.625 V   Lead Channel Pacing Threshold Pulse Width 0.4 ms   Battery Status Unknown    Battery Voltage 2.98 V   Brady Statistic RA Percent Paced 1.8 %   Brady Statistic RV Percent Paced 91.0 %   Eval Rhythm CHB 40BPM   Results for orders placed or performed in visit on 05/19/16 (from the past 8736 hour(s))  Pap IG w/ reflex to HPV when ASC-U   Collection Time: 05/19/16 10:32 AM  Result Value Ref Range   DIAGNOSIS: Comment    Specimen adequacy: Comment    Clinician Provided ICD10 Comment    Performed by: Comment    PAP Smear Comment .    Note: Comment    Test Methodology Comment    PAP Reflex Comment   Microscopic Examination   Collection Time: 05/19/16 10:37 AM  Result Value Ref Range   WBC, UA 6-10 (A) 0 - 5 /hpf   RBC, UA 0-2 0 - 2 /hpf   Epithelial Cells (non renal) 0-10 0 - 10 /hpf   Renal Epithel, UA None seen None seen /hpf   Bacteria, UA Few None seen/Few  Urinalysis, Complete   Collection Time: 05/19/16 10:37 AM  Result Value Ref Range   Specific Gravity, UA <1.005 (L) 1.005 - 1.030   pH, UA 5.0 5.0 - 7.5   Color,  UA Yellow Yellow   Appearance Ur Clear Clear   Leukocytes, UA 1+ (A) Negative   Protein, UA Negative Negative/Trace   Glucose, UA Negative Negative   Ketones, UA Negative Negative   RBC, UA Trace (A) Negative   Bilirubin, UA Negative Negative   Urobilinogen, Ur 0.2 0.2 - 1.0 mg/dL   Nitrite, UA Negative Negative   Microscopic Examination See below:   CMP14+EGFR   Collection Time: 05/19/16 10:37 AM  Result Value Ref Range   Glucose 85 65 - 99 mg/dL   BUN 10 6 - 24 mg/dL   Creatinine, Ser 0.71 0.57 - 1.00 mg/dL   GFR calc non Af Amer 95 >59 mL/min/1.73   GFR calc Af Amer 109 >59 mL/min/1.73   BUN/Creatinine Ratio 14 9 - 23   Sodium 141 134 - 144 mmol/L   Potassium 4.7 3.5 - 5.2  mmol/L   Chloride 102 96 - 106 mmol/L   CO2 23 18 - 29 mmol/L   Calcium 9.4 8.7 - 10.2 mg/dL   Total Protein 7.1 6.0 - 8.5 g/dL   Albumin 4.5 3.5 - 5.5 g/dL   Globulin, Total 2.6 1.5 - 4.5 g/dL   Albumin/Globulin Ratio 1.7 1.2 - 2.2   Bilirubin Total 0.3 0.0 - 1.2 mg/dL   Alkaline Phosphatase 92 39 - 117 IU/L   AST 21 0 - 40 IU/L   ALT 41 (H) 0 - 32 IU/L  CBC with Differential/Platelet   Collection Time: 05/19/16 10:37 AM  Result Value Ref Range   WBC 8.5 3.4 - 10.8 x10E3/uL   RBC 4.81 3.77 - 5.28 x10E6/uL   Hemoglobin 13.9 11.1 - 15.9 g/dL   Hematocrit 42.3 34.0 - 46.6 %   MCV 88 79 - 97 fL   MCH 28.9 26.6 - 33.0 pg   MCHC 32.9 31.5 - 35.7 g/dL   RDW 14.8 12.3 - 15.4 %   Platelets 331 150 - 379 x10E3/uL   Neutrophils 69 Not Estab. %   Lymphs 21 Not Estab. %   Monocytes 7 Not Estab. %   Eos 3 Not Estab. %   Basos 0 Not Estab. %   Neutrophils Absolute 5.8 1.4 - 7.0 x10E3/uL   Lymphocytes Absolute 1.8 0.7 - 3.1 x10E3/uL   Monocytes Absolute 0.6 0.1 - 0.9 x10E3/uL   EOS (ABSOLUTE) 0.3 0.0 - 0.4 x10E3/uL   Basophils Absolute 0.0 0.0 - 0.2 x10E3/uL   Immature Granulocytes 0 Not Estab. %   Immature Grans (Abs) 0.0 0.0 - 0.1 x10E3/uL  Lipid panel   Collection Time: 05/19/16 10:37 AM  Result Value Ref Range   Cholesterol, Total 161 100 - 199 mg/dL   Triglycerides 85 0 - 149 mg/dL   HDL 53 >39 mg/dL   VLDL Cholesterol Cal 17 5 - 40 mg/dL   LDL Calculated 91 0 - 99 mg/dL   Chol/HDL Ratio 3.0 0.0 - 4.4 ratio units  Thyroid Panel With TSH   Collection Time: 05/19/16 10:37 AM  Result Value Ref Range   TSH 2.710 0.450 - 4.500 uIU/mL   T4, Total 8.9 4.5 - 12.0 ug/dL   T3 Uptake Ratio 22 (L) 24 - 39 %   Free Thyroxine Index 2.0 1.2 - 4.9  VITAMIN D 25 Hydroxy (Vit-D Deficiency, Fractures)   Collection Time: 05/19/16 10:37 AM  Result Value Ref Range   Vit D, 25-Hydroxy 18.8 (L) 30.0 - 100.0 ng/mL  HIV antibody   Collection Time: 05/19/16 10:37 AM  Result Value Ref Range  HIV Screen 4th Generation wRfx Non Reactive Non Reactive  Results for orders placed or performed in visit on 03/05/16 (from the past 8736 hour(s))  CUP PACEART REMOTE DEVICE CHECK   Collection Time: 03/05/16  9:38 AM  Result Value Ref Range   Date Time Interrogation Session [id number]    Pulse Generator Manufacturer [employer]    Pulse Gen Model 2272 Assurity MRI    Pulse Gen Serial Number [id number]    Clinic Name [place]    Implantable Pulse Generator Type Implantable Pulse Generator    Implantable Pulse Generator Implant Date [id number]    Implantable Lead Manufacturer [facility]    Implantable Lead Model Tendril MRI [id number]    Implantable Lead Serial Number [id number]    Implantable Lead Implant Date 67672094    Implantable Lead Location Detail 1 UNKNOWN    Implantable Lead Location [id number]    Implantable Lead Manufacturer [employer]    Implantable Lead Model Tendril MRI [id number]    Implantable Lead Serial Number [id number]    Implantable Lead Implant Date [id number]    Implantable Lead Location Detail 1 UNKNOWN    Implantable Lead Location [id number]    Lead Channel Setting Sensing Sensitivity 4.0 mV   Lead Channel Setting Sensing Adaptation Mode Fixed Pacing    Lead Channel Setting Pacing Amplitude 2.0 V   Lead Channel Setting Pacing Pulse Width 0.4 ms   Lead Channel Setting Pacing Amplitude 0.875    Lead Channel Status     Lead Channel Impedance Value 390 ohm   Lead Channel Sensing Intrinsic Amplitude 4.1 mV   Lead Channel Pacing Threshold Amplitude 0.5 V   Lead Channel Pacing Threshold Pulse Width 0.4 ms   Lead Channel Status     Lead Channel Impedance Value 580 ohm   Lead Channel Sensing Intrinsic Amplitude 12.0 mV   Lead Channel Pacing Threshold Amplitude 0.625 V   Lead Channel Pacing Threshold Pulse Width 0.4 ms   Battery Status MOS    Battery Remaining Longevity 121 mo   Battery Remaining Percentage 95.5 %   Battery Voltage 2.98 V   Brady Statistic RA Percent Paced 2.3 %    Brady Statistic RV Percent Paced 88.0 %   Brady Statistic AP VP Percent 2.0 %   Brady Statistic AS VP Percent 86.0 %   Brady Statistic AP VS Percent 1.0 %   Brady Statistic AS VS Percent 11.0 %   Eval Rhythm AS,VP   Results for orders placed or performed in visit on 11/26/15 (from the past 8736 hour(s))  Implantable device - remote   Collection Time: 11/26/15  2:51 PM  Result Value Ref Range   Pulse Generator Manufacturer SJ[employer]    Date Time Interrogation Session [phone]    Pulse Gen Model 2272 Assurity MRI    Pulse Gen Serial Number [id number]    Implantable Pulse Generator Type Implantable Pulse Generator    Implantable Pulse Generator Implant Date [id number]    Implantable Lead Manufacturer [facility]    Implantable Lead Model Tendril MRI [id number]    Implantable Lead Serial Number [id number]    Implantable Lead Implant Date 65681275    Implantable Lead Location [id number]    Implantable Lead Manufacturer [facility]    Implantable Lead Model Tendril MRI [id number]    Implantable Lead Serial Number [id number]    Implantable Lead Implant Date 17001749    Implantable Lead Location [id number]    Lead Channel Impedance Value 480 ohm   Lead Channel Sensing Intrinsic Amplitude 3.8 mV  Lead Channel Pacing Threshold Amplitude 0.5 V   Lead Channel Pacing Threshold Pulse Width 0.4 ms   Lead Channel Impedance Value 590 ohm   Lead Channel Pacing Threshold Amplitude 0.5 V   Lead Channel Pacing Threshold Pulse Width 0.4 ms   Brady Statistic RA Percent Paced 2.4 %   Brady Statistic RV Percent Paced 77 %   Eval Rhythm AS/Vp    Family doctor did blood work last year and it all came back good.  Mental status examination Patient is obese female who is casually dressed and fairly groomed.  She appears to be her stated age.  Her speech is clear and coherent.  Her thought process is  logical.  She denies any active or passive suicidal thoughts homicidal thought.  At this time she denies any visual or  auditory hallucination.  She described her mood as good and her affect is mood appropriate.   There were no flight of ideas or any loose association.  Her attention concentration is fair.  [name] oriented x3.  There were no tremors or shakes.  Her insight judgment and impulse control is good.  Diagnosis:   Axis I: Psychotic Disorder NOS Axis II: Deferred Axis III:  Past Medical History  Diagnosis Date   Anxiety    Phlebitis    History of seasonal allergies    Psychosis    Axis IV: other psychosocial or environmental problems Axis V: 51-60 moderate symptoms  Plan: I will continue Abilify 1 mg at bedtime to prevent recurrence of psychosis  Patient does not want to try higher dose.  [name] comfortable with the current dose.  She does not have any tremors or shakes.  Recommend to call us back if she has any question or concern.  . She will Follow-up with [place] in 6 months  MEDICATIONS this encounter: Meds ordered this encounter  Medications   ARIPiprazole (ABILIFY) 2 MG tablet    Sig: Take 1/2 tab at bed time    Dispense:  30 tablet    Refill:  3   Medical Decision Making Problem Points:  Established problem, stable/improving (1), Review of last therapy session (1) and Review of psycho-social stressors (1) Data Points:  Review or order clinical lab tests (1) Review of medication regiment & side effects (2)   Jadesola Poynter, MD

## 2016-10-28 ENCOUNTER — Ambulatory Visit (INDEPENDENT_AMBULATORY_CARE_PROVIDER_SITE_OTHER): Payer: BC Managed Care – PPO | Admitting: *Deleted

## 2016-10-28 DIAGNOSIS — I442 Atrioventricular block, complete: Secondary | ICD-10-CM

## 2016-10-29 NOTE — Progress Notes (Signed)
Remote pacemaker transmission.   

## 2016-11-05 ENCOUNTER — Encounter: Payer: Self-pay | Admitting: Cardiology

## 2016-11-11 LAB — CUP PACEART REMOTE DEVICE CHECK
Battery Remaining Longevity: 129 mo
Battery Remaining Percentage: 95.5 %
Brady Statistic AP VP Percent: 3.7 %
Brady Statistic AP VS Percent: 1 %
Brady Statistic AS VP Percent: 96 %
Brady Statistic RA Percent Paced: 3.4 %
Brady Statistic RV Percent Paced: 99 %
Date Time Interrogation Session: 20180904213521
Implantable Lead Implant Date: 20170323
Implantable Lead Location: 753859
Implantable Lead Location: 753860
Lead Channel Impedance Value: 430 Ohm
Lead Channel Pacing Threshold Amplitude: 0.625 V
Lead Channel Sensing Intrinsic Amplitude: 12 mV
Lead Channel Sensing Intrinsic Amplitude: 2.9 mV
Lead Channel Setting Pacing Pulse Width: 0.4 ms
MDC IDC LEAD IMPLANT DT: 20170323
MDC IDC MSMT BATTERY VOLTAGE: 2.98 V
MDC IDC MSMT LEADCHNL RA PACING THRESHOLD AMPLITUDE: 0.5 V
MDC IDC MSMT LEADCHNL RA PACING THRESHOLD PULSEWIDTH: 0.4 ms
MDC IDC MSMT LEADCHNL RV IMPEDANCE VALUE: 530 Ohm
MDC IDC MSMT LEADCHNL RV PACING THRESHOLD PULSEWIDTH: 0.4 ms
MDC IDC PG IMPLANT DT: 20170323
MDC IDC SET LEADCHNL RA PACING AMPLITUDE: 2 V
MDC IDC SET LEADCHNL RV PACING AMPLITUDE: 0.875
MDC IDC SET LEADCHNL RV SENSING SENSITIVITY: 4 mV
MDC IDC STAT BRADY AS VS PERCENT: 1 %
Pulse Gen Model: 2272
Pulse Gen Serial Number: 3134337

## 2017-01-27 ENCOUNTER — Telehealth: Payer: Self-pay | Admitting: Cardiology

## 2017-01-27 ENCOUNTER — Ambulatory Visit (INDEPENDENT_AMBULATORY_CARE_PROVIDER_SITE_OTHER): Payer: BC Managed Care – PPO | Admitting: *Deleted

## 2017-01-27 DIAGNOSIS — I442 Atrioventricular block, complete: Secondary | ICD-10-CM

## 2017-01-27 NOTE — Telephone Encounter (Signed)
LMOVM reminding pt to send remote transmission.   

## 2017-01-28 ENCOUNTER — Encounter: Payer: Self-pay | Admitting: Cardiology

## 2017-01-28 LAB — CUP PACEART REMOTE DEVICE CHECK
Battery Remaining Longevity: 129 mo
Battery Remaining Percentage: 95.5 %
Brady Statistic AP VS Percent: 1 %
Brady Statistic RV Percent Paced: 99 %
Implantable Lead Implant Date: 20170323
Implantable Lead Location: 753859
Lead Channel Impedance Value: 430 Ohm
Lead Channel Pacing Threshold Amplitude: 0.625 V
Lead Channel Pacing Threshold Pulse Width: 0.4 ms
Lead Channel Sensing Intrinsic Amplitude: 4.6 mV
Lead Channel Setting Pacing Amplitude: 0.875
Lead Channel Setting Pacing Pulse Width: 0.4 ms
MDC IDC LEAD IMPLANT DT: 20170323
MDC IDC LEAD LOCATION: 753860
MDC IDC MSMT BATTERY VOLTAGE: 2.98 V
MDC IDC MSMT LEADCHNL RA PACING THRESHOLD AMPLITUDE: 0.5 V
MDC IDC MSMT LEADCHNL RA PACING THRESHOLD PULSEWIDTH: 0.4 ms
MDC IDC MSMT LEADCHNL RV IMPEDANCE VALUE: 530 Ohm
MDC IDC MSMT LEADCHNL RV SENSING INTR AMPL: 12 mV
MDC IDC PG IMPLANT DT: 20170323
MDC IDC SESS DTM: 20181204214839
MDC IDC SET LEADCHNL RA PACING AMPLITUDE: 2 V
MDC IDC SET LEADCHNL RV SENSING SENSITIVITY: 4 mV
MDC IDC STAT BRADY AP VP PERCENT: 5.6 %
MDC IDC STAT BRADY AS VP PERCENT: 94 %
MDC IDC STAT BRADY AS VS PERCENT: 1 %
MDC IDC STAT BRADY RA PERCENT PACED: 5.2 %
Pulse Gen Serial Number: 3134337

## 2017-01-28 NOTE — Progress Notes (Signed)
Remote pacemaker transmission.   

## 2017-02-27 ENCOUNTER — Ambulatory Visit (INDEPENDENT_AMBULATORY_CARE_PROVIDER_SITE_OTHER): Payer: BC Managed Care – PPO | Admitting: Psychiatry

## 2017-02-27 ENCOUNTER — Encounter (HOSPITAL_COMMUNITY): Payer: Self-pay | Admitting: Psychiatry

## 2017-02-27 DIAGNOSIS — Z810 Family history of intellectual disabilities: Secondary | ICD-10-CM

## 2017-02-27 DIAGNOSIS — F5105 Insomnia due to other mental disorder: Secondary | ICD-10-CM | POA: Diagnosis not present

## 2017-02-27 DIAGNOSIS — F23 Brief psychotic disorder: Secondary | ICD-10-CM | POA: Diagnosis not present

## 2017-02-27 MED ORDER — ARIPIPRAZOLE 2 MG PO TABS: ORAL_TABLET | ORAL | 3 refills | Status: DC

## 2017-02-27 NOTE — Progress Notes (Signed)
BH MD/PA/NP OP Progress Note  02/27/2017 9:42 AM Alejandra Harrison  MRN:  [id number]  Chief Complaint:  Chief Complaint    Hallucinations; Follow-up     HPI: As patient is a 57 year old married white female who lives with her husband and 2 sons ages 4425 and 2429 [place]. 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  The patient states that in 2012 she started seeing a figure that she thought may have been God. She also got fixated on a man in her Sunday school and thought that her husband would die and she would have to marry this man. She was having lots of religious visions. She claims that she had not been on any medication or using drugs or alcohol at that time. She had not had a head injury or any additional stress. She may of been going through menopause. She denied visual hallucinations but claims she felt "high and on top of the world." She's never had a history of depression however.  At any rate she was seen here by Dr. Toni ArthursFuller and started on Abilify. She was on a higher dose such as 5 mg in the past and it caused akathisia. Currently she takes 1 mg per day and it seems to be working fairly well. She's no longer having visions or unusual thoughts and she's functioning well in her job. She's not having significant side effects  The patient returns after 6 months.  She states that she is doing well.  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  She has not had any further hallucinations or delusions.  She only takes Abilify 1 mg at bedtime with no side effect.  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  Her mood is good and she is sleeping well and denies any symptoms of depression or mania. Visit Diagnosis:    ICD-10-CM   1. Brief psychotic disorder (HCC) F23 ARIPiprazole (ABILIFY) 2 MG tablet  2. Insomnia due to mental disorder F51.05 ARIPiprazole (ABILIFY)  2 MG tablet    Past Psychiatric History: none  Past Medical History:  Past Medical History:  Diagnosis Date  . Anxiety   . AV block, complete (HCC)   . History of seasonal allergies   . Psychosis [facility](HCC)     Past Surgical History:  Procedure Laterality Date  . COLONOSCOPY N/A 09/28/2015   Procedure: COLONOSCOPY;  Surgeon: West BaliSandi L Fields, MD;  [facility];  Service: Endoscopy;  Laterality: N/A;  1030  . EP IMPLANTABLE DEVICE N/A 05/17/2015   Procedure: Pacemaker Implant;  Surgeon: Will Jorja LoaMartin Camnitz, MD;  Location[facility] INVAS[facility] LAB;  Service: Cardiovascular;  Laterality: N/A;  . INSERT / REPLACE / REMOVE PACEMAKER  05/17/2015  . TUBAL LIGATION      Family Psychiatric History: None  Family History:  Family History  Problem Relation Age of Onset  . Dementia Mother   . Diabetes Father   . Breast cancer Paternal Aunt   . ADD / ADHD Neg Hx   . Alcohol abuse Neg Hx   . Drug abuse Neg Hx   . Anxiety disorder Neg Hx   . Bipolar disorder Neg Hx   . Depression Neg Hx   . OCD Neg Hx   . Paranoid behavior Neg Hx   . Schizophrenia Neg Hx   . Seizures Neg Hx   .  Sexual abuse Neg Hx   . Physical abuse Neg Hx   . Colon cancer Neg Hx     Social History:  Social History   Socioeconomic History  . Marital status: Married    Spouse name: None  . Number of children: None  . Years of education: None  . Highest education level: None  Social Needs  . Financial resource strain: None  . Food insecurity - worry: None  . Food insecurity - inability: None  . Transportation needs - medical: None  . Transportation needs - non-medical: None  Occupational History  . None  Tobacco Use  . Smoking status: Never Smoker  . Smokeless tobacco: Never Used  . Tobacco comment: Never smoked  Substance and Sexual Activity  . Alcohol use: No    Alcohol/week: 0.0 oz  . Drug use: No  . Sexual activity: Yes    Birth control/protection: Post-menopausal  Other Topics Concern  . None   Social History Narrative  . None    Allergies:  Allergies  Allergen Reactions  . Oxycodone-Acetaminophen Hives and Other (See Comments)    Hands peeled  . Penicillins Hives and Other (See Comments)    Hands peeled    Metabolic Disorder Labs: No results found for: HGBA1C, MPG No results found for: PROLACTIN Lab Results  Component Value Date   CHOL 161 05/19/2016   TRIG 85 05/19/2016   HDL 53 05/19/2016   CHOLHDL 3.0 05/19/2016   LDLCALC 91 05/19/2016   LDLCALC 97 03/14/2015   Lab Results  Component Value Date   TSH 2.710 05/19/2016   TSH 2.730 03/14/2015    Therapeutic Level Labs: No results found for: LITHIUM No results found for: VALPROATE No components found for:  CBMZ  Current Medications: Current Outpatient Medications  Medication Sig Dispense Refill  . ARIPiprazole (ABILIFY) 2 MG tablet Take 1/2 tab at bed time 30 tablet 3  . nystatin (MYCOSTATIN/NYSTOP) powder Apply topically 4 (four) times daily. 15 g 0  . Vitamin D, Ergocalciferol, (DRISDOL) 50000 units CAPS capsule Take 1 capsule (50,000 Units total) by mouth every 7 (seven) days. 12 capsule 3  . metoprolol tartrate (LOPRESSOR) 25 MG tablet Take 1 tablet (25 mg total) by mouth 2 (two) times daily. 180 tablet 3   No current facility-administered medications for this visit.      Musculoskeletal: Strength & Muscle Tone: within normal limits Gait & Station: normal Patient leans: N/A  Psychiatric Specialty Exam: Review of Systems  All other systems reviewed and are negative.   Blood pressure 121/81, pulse 73, height 5\' 5"  (1.651 m), weight 238 lb (108 kg), SpO2 96 %.Body mass index is 39.61 kg/m.  General Appearance: Casual  Eye Contact:  Good  Speech:  Clear and Coherent  Volume:  Normal  Mood:  Euthymic  Affect:  Congruent  Thought Process:  Goal Directed  Orientation:  Full (Time, Place, and Person)  Thought Content: WDL   Suicidal Thoughts:  No  Homicidal Thoughts:  No  Memory:   Immediate;   Good Recent;   Good Remote;   Fair  Judgement:  Good  Insight:  Fair  Psychomotor Activity:  Normal  Concentration:  Concentration: Good and Attention Span: Good  Recall:  Good  Fund of Knowledge: Good  Language: Good  Akathisia:  No  Handed:  Right  AIMS (if indicated): done  Assets:  Communication Skills Desire for Improvement Resilience Social Support Talents/Skills  ADL's:  Intact  Cognition: WNL  Sleep:  Good  Screenings: PHQ2-9     Office Visit from 05/19/2016 in [facility] Office Visit from 03/14/2016 in [facility] Office Visit from 05/07/2015 in [facility] Office Visit from 01/05/2015 in [facility]  PHQ-2 Total Score  0  0  0  0       Assessment and Plan: This patient is a 57 year old female with a history of hallucinations and delusions that started in middle age.  [name] controlled on a very low dose of antipsychotic-Abilify 1 mg at bedtime without side effect or akathisia.  She will continue this dosage and return to see me in 6 months or call sooner if needed   [name], MD 02/27/2017, 9:42 AM

## 2017-03-26 ENCOUNTER — Encounter: Payer: Self-pay | Admitting: Physician Assistant

## 2017-03-26 ENCOUNTER — Ambulatory Visit: Payer: BC Managed Care – PPO | Admitting: Physician Assistant

## 2017-03-26 VITALS — BP 130/87 | HR 74 | Temp 97.9°F | Ht 65.0 in | Wt 238.0 lb

## 2017-03-26 DIAGNOSIS — N3001 Acute cystitis with hematuria: Secondary | ICD-10-CM

## 2017-03-26 DIAGNOSIS — R399 Unspecified symptoms and signs involving the genitourinary system: Secondary | ICD-10-CM | POA: Diagnosis not present

## 2017-03-26 LAB — URINALYSIS
Bilirubin, UA: NEGATIVE
GLUCOSE, UA: NEGATIVE
KETONES UA: NEGATIVE
Nitrite, UA: NEGATIVE
PROTEIN UA: NEGATIVE
Specific Gravity, UA: 1.025 (ref 1.005–1.030)
UUROB: 0.2 mg/dL (ref 0.2–1.0)
pH, UA: 5.5 (ref 5.0–7.5)

## 2017-03-26 MED ORDER — SULFAMETHOXAZOLE-TRIMETHOPRIM 800-160 MG PO TABS: 1.0000 | ORAL_TABLET | Freq: Two times a day (BID) | ORAL | 0 refills | Status: DC

## 2017-03-26 NOTE — Progress Notes (Signed)
BP 130/87   Pulse 74   Temp 97.9 F (36.6 C) (Oral)   Ht 5\' 5"  (1.651 m)   Wt 238 lb (108 kg)   BMI 39.61 kg/m    Subjective:    Patient ID: Alejandra Harrison, female    DOB: 08/22/1960, 57 y.o.   MRN: [id number]  HPI: Alejandra Harrison is a 57 y.o. female presenting on 03/26/2017 for Vaginal Bleeding and Urinary Urgency  This patient has had several days of dysuria, frequency and nocturia. There is also pain over the bladder in the suprapubic region, no back pain. Denies leakage or hematuria.  Denies fever or chills. No pain in flank area.  Relevant past medical, surgical, family and social history reviewed and updated as indicated. Allergies and medications reviewed and updated.  Past Medical History:  Diagnosis Date  . Anxiety   . AV block, complete (HCC)   . History of seasonal allergies   . Psychosis [facility](HCC)     Past Surgical History:  Procedure Laterality Date  . COLONOSCOPY N/A 09/28/2015   Procedure: COLONOSCOPY;  Surgeon: West BaliSandi L Fields, MD;  [facility];  Service: Endoscopy;  Laterality: N/A;  1030  . EP IMPLANTABLE DEVICE N/A 05/17/2015   Procedure: Pacemaker Implant;  Surgeon: Will Jorja LoaMartin Camnitz, MD;  [facility];  Service: Cardiovascular;  Laterality: N/A;  . INSERT / REPLACE / REMOVE PACEMAKER  05/17/2015  . TUBAL LIGATION      Review of Systems  Constitutional: Negative.   HENT: Negative.   Eyes: Negative.   Respiratory: Negative.   Gastrointestinal: Negative.   Genitourinary: Positive for difficulty urinating, dysuria and urgency. Negative for flank pain.    Allergies as of 03/26/2017      Reactions   Oxycodone-acetaminophen Hives, Other (See Comments)   Hands peeled   Penicillins Hives, Other (See Comments)   Hands peeled      Medication List        Accurate as of 03/26/17  5:46 PM. Always use your most recent med list.          ARIPiprazole 2 MG tablet Commonly known as:  ABILIFY Take 1/2 tab at bed time   metoprolol  tartrate 25 MG tablet Commonly known as:  LOPRESSOR Take 1 tablet (25 mg total) by mouth 2 (two) times daily.   sulfamethoxazole-trimethoprim 800-160 MG tablet Commonly known as:  BACTRIM DS Take 1 tablet by mouth 2 (two) times daily.   Vitamin D (Ergocalciferol) 50000 units Caps capsule Commonly known as:  DRISDOL Take 1 capsule (50,000 Units total) by mouth every 7 (seven) days.          Objective:    BP 130/87   Pulse 74   Temp 97.9 F (36.6 C) (Oral)   Ht 5\' 5"  (1.651 m)   Wt 238 lb (108 kg)   BMI 39.61 kg/m   Allergies  Allergen Reactions  . Oxycodone-Acetaminophen Hives and Other (See Comments)    Hands peeled  . Penicillins Hives and Other (See Comments)    Hands peeled    Physical Exam  Constitutional: [name] oriented to person, place, and time. She appears well-developed and well-nourished.  HENT:  Head: Normocephalic and atraumatic.  Eyes: Conjunctivae are normal. Pupils are equal, round, and reactive to light.  Cardiovascular: Normal rate, regular rhythm, normal heart sounds and intact distal pulses.  Pulmonary/Chest: Effort normal and breath sounds normal.  Abdominal: Soft. Bowel sounds are normal. She exhibits no distension and  no mass. There is tenderness in the suprapubic area. There is no rebound, no guarding and no CVA tenderness.  Neurological: [name] alert and oriented to person, place, and time. She has normal reflexes.  Skin: Skin is warm and dry. No rash noted.  Psychiatric: She has a normal mood and affect. Her behavior is normal. Judgment and thought content normal.  Nursing note and vitals reviewed.   Results for orders placed or performed in visit on 03/26/17  Urinalysis  Result Value Ref Range   Specific Gravity, UA 1.025 1.005 - 1.030   pH, UA 5.5 5.0 - 7.5   Color, UA Yellow Yellow   Appearance Ur Clear Clear   Leukocytes, UA 2+ (A) Negative   Protein, UA Negative Negative/Trace   Glucose, UA Negative Negative   Ketones, UA  Negative Negative   RBC, UA 1+ (A) Negative   Bilirubin, UA Negative Negative   Urobilinogen, Ur 0.2 0.2 - 1.0 mg/dL   Nitrite, UA Negative Negative      Assessment & Plan:   1. UTI symptoms - Urinalysis - Urine Culture; Future - Urine Culture  2. Acute cystitis with hematuria - sulfamethoxazole-trimethoprim (BACTRIM DS) 800-160 MG tablet; Take 1 tablet by mouth 2 (two) times daily.  Dispense: 20 tablet; Refill: 0    Current Outpatient Medications:  .  ARIPiprazole (ABILIFY) 2 MG tablet, Take 1/2 tab at bed time, Disp: 30 tablet, Rfl: 3 .  metoprolol tartrate (LOPRESSOR) 25 MG tablet, Take 1 tablet (25 mg total) by mouth 2 (two) times daily., Disp: 180 tablet, Rfl: 3 .  sulfamethoxazole-trimethoprim (BACTRIM DS) 800-160 MG tablet, Take 1 tablet by mouth 2 (two) times daily., Disp: 20 tablet, Rfl: 0 .  Vitamin D, Ergocalciferol, (DRISDOL) 50000 units CAPS capsule, Take 1 capsule (50,000 Units total) by mouth every 7 (seven) days., Disp: 12 capsule, Rfl: 3 Continue all other maintenance medications as listed above.  Follow up plan: Return if symptoms worsen or fail to improve.  Educational handout given for survey  Remus Loffler PA-C [facility] [address]  [place], [place] [address] [phone]   03/26/2017, 5:46 PM

## 2017-03-26 NOTE — Patient Instructions (Signed)
Pessary  ?

## 2017-03-28 LAB — URINE CULTURE

## 2017-04-28 ENCOUNTER — Ambulatory Visit (INDEPENDENT_AMBULATORY_CARE_PROVIDER_SITE_OTHER): Payer: BC Managed Care – PPO | Admitting: *Deleted

## 2017-04-28 ENCOUNTER — Telehealth: Payer: Self-pay | Admitting: Cardiology

## 2017-04-28 DIAGNOSIS — I442 Atrioventricular block, complete: Secondary | ICD-10-CM

## 2017-04-28 NOTE — Telephone Encounter (Signed)
Confirmed remote transmission w/ pt husband.   

## 2017-04-29 ENCOUNTER — Encounter: Payer: Self-pay | Admitting: Cardiology

## 2017-04-29 NOTE — Progress Notes (Signed)
Remote pacemaker transmission.   

## 2017-05-02 LAB — CUP PACEART REMOTE DEVICE CHECK
Date Time Interrogation Session: 20190309135750
Implantable Lead Implant Date: 20170323
Implantable Lead Location: 753859
MDC IDC LEAD IMPLANT DT: 20170323
MDC IDC LEAD LOCATION: 753860
MDC IDC PG IMPLANT DT: 20170323
MDC IDC PG SERIAL: 3134337

## 2017-06-08 ENCOUNTER — Encounter: Payer: Self-pay | Admitting: Family

## 2017-06-08 ENCOUNTER — Ambulatory Visit (INDEPENDENT_AMBULATORY_CARE_PROVIDER_SITE_OTHER): Payer: BC Managed Care – PPO | Admitting: Family

## 2017-06-08 VITALS — BP 140/92 | HR 64 | Temp 97.0°F | Ht 65.0 in | Wt 238.6 lb

## 2017-06-08 DIAGNOSIS — Z01419 Encounter for gynecological examination (general) (routine) without abnormal findings: Secondary | ICD-10-CM

## 2017-06-08 DIAGNOSIS — I442 Atrioventricular block, complete: Secondary | ICD-10-CM

## 2017-06-08 DIAGNOSIS — K5900 Constipation, unspecified: Secondary | ICD-10-CM

## 2017-06-08 DIAGNOSIS — K219 Gastro-esophageal reflux disease without esophagitis: Secondary | ICD-10-CM

## 2017-06-08 DIAGNOSIS — F29 Unspecified psychosis not due to a substance or known physiological condition: Secondary | ICD-10-CM

## 2017-06-08 DIAGNOSIS — Z0000 Encounter for general adult medical examination without abnormal findings: Secondary | ICD-10-CM | POA: Diagnosis not present

## 2017-06-08 LAB — URINALYSIS, COMPLETE
Bilirubin, UA: NEGATIVE
GLUCOSE, UA: NEGATIVE
Ketones, UA: NEGATIVE
NITRITE UA: NEGATIVE
PROTEIN UA: NEGATIVE
SPEC GRAV UA: 1.01 (ref 1.005–1.030)
Urobilinogen, Ur: 0.2 mg/dL (ref 0.2–1.0)
pH, UA: 7 (ref 5.0–7.5)

## 2017-06-08 LAB — MICROSCOPIC EXAMINATION

## 2017-06-08 NOTE — Addendum Note (Signed)
Addended by: Almeta MonasSTONE, JANIE M on: 06/08/2017 04:31 PM   Modules accepted: Orders

## 2017-06-08 NOTE — Patient Instructions (Signed)
In a few days you may receive a survey in the mail or online from [employer] regarding your visit with [place] today. Please take a moment to fill this out. Your feedback is very important to our whole office. It can help [place] better understand your needs as well as improve your experience and satisfaction. Thank you for taking your time to complete it. We care about you.  Evelina Dun, FNP  Health Maintenance, Female Adopting a healthy lifestyle and getting preventive care can go a long way to promote health and wellness. Talk with your health care provider about what schedule of regular examinations is right for you. This is a good chance for you to check in with your provider about disease prevention and staying healthy. In between checkups, there are plenty of things you can do on your own. Experts have done a lot of research about which lifestyle changes and preventive measures are most likely to keep you healthy. Ask your health care provider for more information. Weight and diet Eat a healthy diet  Be sure to include plenty of vegetables, fruits, low-fat dairy products, and lean protein.  Do not eat a lot of foods high in solid fats, added sugars, or salt.  Get regular exercise. This is one of the most important things you can do for your health. ? Most adults should exercise for at least 150 minutes each week. The exercise should increase your heart rate and make you sweat (moderate-intensity exercise). ? Most adults should also do strengthening exercises at least twice a week. This is in addition to the moderate-intensity exercise.  Maintain a healthy weight  Body mass index (BMI) is a measurement that can be used to identify possible weight problems. It estimates body fat based on height and weight. Your health care provider can help determine your BMI and help you achieve or maintain a healthy weight.  For females 64 years of age and older: ? A BMI below 18.5 is considered  underweight. ? A BMI of 18.5 to 24.9 is normal. ? A BMI of 25 to 29.9 is considered overweight. ? A BMI of 30 and above is considered obese.  Watch levels of cholesterol and blood lipids  You should start having your blood tested for lipids and cholesterol at 58 years of age, then have this test every 5 years.  You may need to have your cholesterol levels checked more often if: ? Your lipid or cholesterol levels are high. ? You are older than 58 years of age. ? You are at high risk for heart disease.  Cancer screening Lung Cancer  Lung cancer screening is recommended for adults 14-16 years old who are at high risk for lung cancer because of a history of smoking.  A yearly low-dose CT scan of the lungs is recommended for people who: ? Currently smoke. ? Have quit within the past 15 years. ? Have at least a 30-pack-year history of smoking. A pack year is smoking an average of one pack of cigarettes a day for 1 year.  Yearly screening should continue until it has been 15 years since you quit.  Yearly screening should stop if you develop a health problem that would prevent you from having lung cancer treatment.  Breast Cancer  Practice breast self-awareness. This means understanding how your breasts normally appear and feel.  It also means doing regular breast self-exams. Let your health care provider know about any changes, no matter how small.  If you are in  your 20s or 30s, you should have a clinical breast exam (CBE) by a health care provider every 1-3 years as part of a regular health exam.  If you are 5 or older, have a CBE every year. Also consider having a breast X-ray (mammogram) every year.  If you have a family history of breast cancer, talk to your health care provider about genetic screening.  If you are at high risk for breast cancer, talk to your health care provider about having an MRI and a mammogram every year.  Breast cancer gene (BRCA) assessment is  recommended for women who have family members with BRCA-related cancers. BRCA-related cancers include: ? Breast. ? Ovarian. ? Tubal. ? Peritoneal cancers.  Results of the assessment will determine the need for genetic counseling and BRCA1 and BRCA2 testing.  Cervical Cancer Your health care provider may recommend that you be screened regularly for cancer of the pelvic organs (ovaries, uterus, and vagina). This screening involves a pelvic examination, including checking for microscopic changes to the surface of your cervix (Pap test). You may be encouraged to have this screening done every 3 years, beginning at age 64.  For women ages 74-65, health care providers may recommend pelvic exams and Pap testing every 3 years, or they may recommend the Pap and pelvic exam, combined with testing for human papilloma virus (HPV), every 5 years. Some types of HPV increase your risk of cervical cancer. Testing for HPV may also be done on women of any age with unclear Pap test results.  Other health care providers may not recommend any screening for nonpregnant women who are considered low risk for pelvic cancer and who do not have symptoms. Ask your health care provider if a screening pelvic exam is right for you.  If you have had past treatment for cervical cancer or a condition that could lead to cancer, you need Pap tests and screening for cancer for at least 20 years after your treatment. If Pap tests have been discontinued, your risk factors (such as having a new sexual partner) need to be reassessed to determine if screening should resume. Some women have medical problems that increase the chance of getting cervical cancer. In these cases, your health care provider may recommend more frequent screening and Pap tests.  Colorectal Cancer  This type of cancer can be detected and often prevented.  Routine colorectal cancer screening usually begins at 58 years of age and continues through 58 years of  age.  Your health care provider may recommend screening at an earlier age if you have risk factors for colon cancer.  Your health care provider may also recommend using home test kits to check for hidden blood in the stool.  A small camera at the end of a tube can be used to examine your colon directly (sigmoidoscopy or colonoscopy). This is done to check for the earliest forms of colorectal cancer.  Routine screening usually begins at age 63.  Direct examination of the colon should be repeated every 5-10 years through 58 years of age. However, you may need to be screened more often if early forms of precancerous polyps or small growths are found.  Skin Cancer  Check your skin from head to toe regularly.  Tell your health care provider about any new moles or changes in moles, especially if there is a change in a mole's shape or color.  Also tell your health care provider if you have a mole that is larger than the size  of a pencil eraser.  Always use sunscreen. Apply sunscreen liberally and repeatedly throughout the day.  Protect yourself by wearing long sleeves, pants, a wide-brimmed hat, and sunglasses whenever you are outside.  Heart disease, diabetes, and high blood pressure  High blood pressure causes heart disease and increases the risk of stroke. High blood pressure is more likely to develop in: ? People who have blood pressure in the high end of the normal range (130-139/85-89 mm Hg). ? People who are overweight or obese. ? People who are African American.  If you are 52-60 years of age, have your blood pressure checked every 3-5 years. If you are 21 years of age or older, have your blood pressure checked every year. You should have your blood pressure measured twice-once when you are at a hospital or clinic, and once when you are not at a hospital or clinic. Record the average of the two measurements. To check your blood pressure when you are not at a hospital or clinic, you  can use: ? An automated blood pressure machine at a pharmacy. ? A home blood pressure monitor.  If you are between 1 years and 13 years old, ask your health care provider if you should take aspirin to prevent strokes.  Have regular diabetes screenings. This involves taking a blood sample to check your fasting blood sugar level. ? If you are at a normal weight and have a low risk for diabetes, have this test once every three years after 58 years of age. ? If you are overweight and have a high risk for diabetes, consider being tested at a younger age or more often. Preventing infection Hepatitis B  If you have a higher risk for hepatitis B, you should be screened for this virus. You are considered at high risk for hepatitis B if: ? You were born in a country where hepatitis B is common. Ask your health care provider which countries are considered high risk. ? Your parents were born in a high-risk country, and you have not been immunized against hepatitis B (hepatitis B vaccine). ? You have HIV or AIDS. ? You use needles to inject street drugs. ? You live with someone who has hepatitis B. ? You have had sex with someone who has hepatitis B. ? You get hemodialysis treatment. ? You take certain medicines for conditions, including cancer, organ transplantation, and autoimmune conditions.  Hepatitis C  Blood testing is recommended for: ? Everyone born from [birth year] through 1966. ? Anyone with known risk factors for hepatitis C.  Sexually transmitted infections (STIs)  You should be screened for sexually transmitted infections (STIs) including gonorrhea and chlamydia if: ? You are sexually active and are younger than 58 years of age. ? You are older than 58 years of age and your health care provider tells you that you are at risk for this type of infection. ? Your sexual activity has changed since you were last screened and you are at an increased risk for chlamydia or gonorrhea. Ask your  health care provider if you are at risk.  If you do not have HIV, but are at risk, it may be recommended that you take a prescription medicine daily to prevent HIV infection. This is called pre-exposure prophylaxis (PrEP). You are considered at risk if: ? You are sexually active and do not regularly use condoms or know the HIV status of your partner(s). ? You take drugs by injection. ? You are sexually active with a partner who  has HIV.  Talk with your health care provider about whether you are at high risk of being infected with HIV. If you choose to begin PrEP, you should first be tested for HIV. You should then be tested every 3 months for as long as you are taking PrEP. Pregnancy  If you are premenopausal and you may become pregnant, ask your health care provider about preconception counseling.  If you may become pregnant, take 400 to 800 micrograms (mcg) of folic acid every day.  If you want to prevent pregnancy, talk to your health care provider about birth control (contraception). Osteoporosis and menopause  Osteoporosis is a disease in which the bones lose minerals and strength with aging. This can result in serious bone fractures. Your risk for osteoporosis can be identified using a bone density scan.  If you are 26 years of age or older, or if you are at risk for osteoporosis and fractures, ask your health care provider if you should be screened.  Ask your health care provider whether you should take a calcium or vitamin D supplement to lower your risk for osteoporosis.  Menopause may have certain physical symptoms and risks.  Hormone replacement therapy may reduce some of these symptoms and risks. Talk to your health care provider about whether hormone replacement therapy is right for you. Follow these instructions at home:  Schedule regular health, dental, and eye exams.  Stay current with your immunizations.  Do not use any tobacco products including cigarettes, chewing  tobacco, or electronic cigarettes.  If you are pregnant, do not drink alcohol.  If you are breastfeeding, limit how much and how often you drink alcohol.  Limit alcohol intake to no more than 1 drink per day for nonpregnant women. One drink equals 12 ounces of beer, 5 ounces of wine, or 1 ounces of hard liquor.  Do not use street drugs.  Do not share needles.  Ask your health care provider for help if you need support or information about quitting drugs.  Tell your health care provider if you often feel depressed.  Tell your health care provider if you have ever been abused or do not feel safe at home. This information is not intended to replace advice given to you by your health care provider. Make sure you discuss any questions you have with your health care provider. Document Released: 08/26/2010 Document Revised: 07/19/2015 Document Reviewed: 11/14/2014 Elsevier Interactive Patient Education  [employer].

## 2017-06-08 NOTE — Progress Notes (Signed)
Subjective:    Patient ID: Alejandra Harrison, female    DOB: [birth year], 57 y.o.   MRN: [id number]  Pt presents to the office today for CPE with pap. PT goes to [facility] every 6 months and taking Abilify for psychosis and hallucinations. States this is working well for her. [name] followed by Cardiologists annually for heart block and has pacemaker in placed. Stable at this time. Gynecologic Exam  The patient's pertinent negatives include no genital itching, genital lesions, genital odor, vaginal bleeding or vaginal discharge. The patient is experiencing no pain. Associated symptoms include constipation.  Gastroesophageal Reflux  She complains of heartburn. She reports no belching or no coughing. This is a chronic problem. The current episode started more than 1 year ago. The problem occurs occasionally. The symptoms are aggravated by certain foods and lying down. She has tried a PPI and an antacid for the symptoms. The treatment provided moderate relief.  Constipation  This is a chronic problem. The current episode started more than 1 year ago. The problem has been resolved since onset. She has tried fiber for the symptoms. The treatment provided moderate relief.      Review of Systems  Respiratory: Negative for cough.   Gastrointestinal: Positive for constipation and heartburn.  Genitourinary: Negative for vaginal discharge.  All other systems reviewed and are negative.      Objective:   Physical Exam  Constitutional: [name] oriented to person, place, and time. She appears well-developed and well-nourished. No distress.  HENT:  Head: Normocephalic and atraumatic.  Right Ear: External ear normal.  Left Ear: External ear normal.  Nose: Nose normal.  Mouth/Throat: Oropharynx is clear and moist.  Eyes: Pupils are equal, round, and reactive to light.  Neck: Normal range of motion. Neck supple. No thyromegaly present.  Cardiovascular: Normal rate, regular rhythm, normal heart  sounds and intact distal pulses.  No murmur heard. Pulmonary/Chest: Effort normal and breath sounds normal. No respiratory distress. She has no wheezes. Right breast exhibits no inverted nipple, no mass, no nipple discharge, no skin change and no tenderness. Left breast exhibits no inverted nipple, no mass, no nipple discharge, no skin change and no tenderness. Breasts are symmetrical.  Abdominal: Soft. Bowel sounds are normal. She exhibits no distension. There is no tenderness.  Genitourinary: Vagina normal.  Genitourinary Comments: Bimanual exam- no adnexal masses or tenderness, ovaries nonpalpable   Cervix parous and pink- No discharge   Musculoskeletal: Normal range of motion. She exhibits no edema or tenderness.  Neurological: [name] alert and oriented to person, place, and time. She has normal reflexes. No cranial nerve deficit.  Skin: Skin is warm and dry.  Psychiatric: She has a normal mood and affect. Her behavior is normal. Judgment and thought content normal.  Vitals reviewed.    BP (!) 140/92   Pulse 64   Temp (!) 97 F (36.1 C) (Oral)   Ht '5\' 5"'$  (1.651 m)   Wt 238 lb 9.6 oz (108.2 kg)   BMI 39.71 kg/m      Assessment & Plan:  1. Normal gynecologic examination - Urinalysis, Complete - CMP14+EGFR  2. Gastroesophageal reflux disease, esophagitis presence not specified - CMP14+EGFR  3. CHB (complete heart block) (HCC) - CMP14+EGFR  4. Psychosis, unspecified psychosis type (HCC)  - CMP14+EGFR  5. Constipation, unspecified constipation type - CMP14+EGFR  6. Morbid obesity (HCC)  - CMP14+EGFR  7. Annual physical exam - Anemia Profile B - CMP14+EGFR - Lipid panel - VITAMIN  D 25 Hydroxy (Vit-D Deficiency, Fractures) - TSH   Continue all meds Labs pending Health Maintenance reviewed Diet and exercise encouraged RTO 1 year   Evelina Dun, FNP

## 2017-06-09 ENCOUNTER — Other Ambulatory Visit: Payer: Self-pay | Admitting: Family

## 2017-06-09 DIAGNOSIS — E538 Deficiency of other specified B group vitamins: Secondary | ICD-10-CM | POA: Insufficient documentation

## 2017-06-09 LAB — CMP14+EGFR
A/G RATIO: 1.7 (ref 1.2–2.2)
ALK PHOS: 77 IU/L (ref 39–117)
ALT: 39 IU/L — AB (ref 0–32)
AST: 27 IU/L (ref 0–40)
Albumin: 4.4 g/dL (ref 3.5–5.5)
BUN/Creatinine Ratio: 10 (ref 9–23)
BUN: 7 mg/dL (ref 6–24)
Bilirubin Total: 0.3 mg/dL (ref 0.0–1.2)
CALCIUM: 9.5 mg/dL (ref 8.7–10.2)
CO2: 21 mmol/L (ref 20–29)
Chloride: 105 mmol/L (ref 96–106)
Creatinine, Ser: 0.67 mg/dL (ref 0.57–1.00)
GFR calc Af Amer: 112 mL/min/{1.73_m2} (ref >59)
GFR, EST NON AFRICAN AMERICAN: 97 mL/min/{1.73_m2} (ref >59)
Globulin, Total: 2.6 g/dL (ref 1.5–4.5)
Glucose: 76 mg/dL (ref 65–99)
POTASSIUM: 4.4 mmol/L (ref 3.5–5.2)
Sodium: 141 mmol/L (ref 134–144)
Total Protein: 7 g/dL (ref 6.0–8.5)

## 2017-06-09 LAB — ANEMIA PROFILE B
Basophils Absolute: 0 10*3/uL (ref 0.0–0.2)
Basos: 0 %
EOS (ABSOLUTE): 0.2 10*3/uL (ref 0.0–0.4)
Eos: 3 %
FOLATE: 15.8 ng/mL (ref >3.0)
Ferritin: 204 ng/mL — ABNORMAL HIGH (ref 15–150)
Hematocrit: 42 % (ref 34.0–46.6)
Hemoglobin: 13.7 g/dL (ref 11.1–15.9)
IMMATURE GRANS (ABS): 0 10*3/uL (ref 0.0–0.1)
IMMATURE GRANULOCYTES: 0 %
Iron Saturation: 17 % (ref 15–55)
Iron: 54 ug/dL (ref 27–159)
LYMPHS: 24 %
Lymphocytes Absolute: 1.6 10*3/uL (ref 0.7–3.1)
MCH: 29 pg (ref 26.6–33.0)
MCHC: 32.6 g/dL (ref 31.5–35.7)
MCV: 89 fL (ref 79–97)
MONOS ABS: 0.5 10*3/uL (ref 0.1–0.9)
Monocytes: 7 %
NEUTROS PCT: 66 %
Neutrophils Absolute: 4.6 10*3/uL (ref 1.4–7.0)
PLATELETS: 314 10*3/uL (ref 150–379)
RBC: 4.72 x10E6/uL (ref 3.77–5.28)
RDW: 14.7 % (ref 12.3–15.4)
Retic Ct Pct: 1.9 % (ref 0.6–2.6)
Total Iron Binding Capacity: 310 ug/dL (ref 250–450)
UIBC: 256 ug/dL (ref 131–425)
Vitamin B-12: 229 pg/mL — ABNORMAL LOW (ref 232–1245)
WBC: 7 10*3/uL (ref 3.4–10.8)

## 2017-06-09 LAB — LIPID PANEL
CHOL/HDL RATIO: 3.2 ratio (ref 0.0–4.4)
CHOLESTEROL TOTAL: 159 mg/dL (ref 100–199)
HDL: 50 mg/dL (ref >39)
LDL Calculated: 91 mg/dL (ref 0–99)
TRIGLYCERIDES: 92 mg/dL (ref 0–149)
VLDL Cholesterol Cal: 18 mg/dL (ref 5–40)

## 2017-06-09 LAB — PAP IG W/ RFLX HPV ASCU: PAP Smear Comment: 0

## 2017-06-09 LAB — TSH: TSH: 3.29 u[IU]/mL (ref 0.450–4.500)

## 2017-06-09 LAB — VITAMIN D 25 HYDROXY (VIT D DEFICIENCY, FRACTURES): Vit D, 25-Hydroxy: 24.6 ng/mL — ABNORMAL LOW (ref 30.0–100.0)

## 2017-06-09 MED ORDER — VITAMIN D (ERGOCALCIFEROL) 1.25 MG (50000 UNIT) PO CAPS: 50000.0000 [IU] | ORAL_CAPSULE | ORAL | 3 refills | Status: DC

## 2017-06-15 ENCOUNTER — Ambulatory Visit (INDEPENDENT_AMBULATORY_CARE_PROVIDER_SITE_OTHER): Payer: BC Managed Care – PPO | Admitting: *Deleted

## 2017-06-15 DIAGNOSIS — E538 Deficiency of other specified B group vitamins: Secondary | ICD-10-CM | POA: Diagnosis not present

## 2017-06-15 MED ORDER — CYANOCOBALAMIN 1000 MCG/ML IJ SOLN
1000.0000 ug | INTRAMUSCULAR | Status: AC
  Administered 2017-06-15 – 2018-04-27 (×11): 1000 ug via INTRAMUSCULAR

## 2017-06-15 NOTE — Progress Notes (Signed)
Cyanocobalamin inj given Pt tolerated well 

## 2017-07-15 ENCOUNTER — Ambulatory Visit (INDEPENDENT_AMBULATORY_CARE_PROVIDER_SITE_OTHER): Payer: BC Managed Care – PPO | Admitting: *Deleted

## 2017-07-15 DIAGNOSIS — E538 Deficiency of other specified B group vitamins: Secondary | ICD-10-CM | POA: Diagnosis not present

## 2017-07-15 NOTE — Progress Notes (Signed)
Pt given Cyanocobalamin inj Tolerated well 

## 2017-07-19 ENCOUNTER — Other Ambulatory Visit: Payer: Self-pay | Admitting: Cardiology

## 2017-07-28 ENCOUNTER — Ambulatory Visit (INDEPENDENT_AMBULATORY_CARE_PROVIDER_SITE_OTHER): Payer: BC Managed Care – PPO | Admitting: *Deleted

## 2017-07-28 DIAGNOSIS — I442 Atrioventricular block, complete: Secondary | ICD-10-CM | POA: Diagnosis not present

## 2017-07-28 NOTE — Progress Notes (Signed)
Remote pacemaker transmission.   

## 2017-08-03 ENCOUNTER — Ambulatory Visit: Payer: BC Managed Care – PPO | Admitting: Cardiology

## 2017-08-03 ENCOUNTER — Encounter: Payer: Self-pay | Admitting: Cardiology

## 2017-08-03 VITALS — BP 124/80 | HR 67 | Ht 65.0 in | Wt 241.4 lb

## 2017-08-03 DIAGNOSIS — I472 Ventricular tachycardia, unspecified: Secondary | ICD-10-CM

## 2017-08-03 DIAGNOSIS — I442 Atrioventricular block, complete: Secondary | ICD-10-CM | POA: Diagnosis not present

## 2017-08-03 LAB — CUP PACEART INCLINIC DEVICE CHECK
Brady Statistic RA Percent Paced: 5.5 %
Brady Statistic RV Percent Paced: 99 %
Date Time Interrogation Session: 20190610115421
Implantable Lead Implant Date: 20170323
Implantable Lead Location: 753860
Lead Channel Pacing Threshold Amplitude: 0.5 V
Lead Channel Pacing Threshold Amplitude: 0.75 V
Lead Channel Sensing Intrinsic Amplitude: 3.6 mV
MDC IDC LEAD IMPLANT DT: 20170323
MDC IDC LEAD LOCATION: 753859
MDC IDC MSMT BATTERY VOLTAGE: 2.98 V
MDC IDC MSMT LEADCHNL RA IMPEDANCE VALUE: 410 Ohm
MDC IDC MSMT LEADCHNL RA PACING THRESHOLD PULSEWIDTH: 0.4 ms
MDC IDC MSMT LEADCHNL RV IMPEDANCE VALUE: 590 Ohm
MDC IDC MSMT LEADCHNL RV PACING THRESHOLD PULSEWIDTH: 0.4 ms
MDC IDC PG IMPLANT DT: 20170323
Pulse Gen Model: 2272
Pulse Gen Serial Number: 3134337

## 2017-08-03 MED ORDER — METOPROLOL TARTRATE 50 MG PO TABS: 50.0000 mg | ORAL_TABLET | Freq: Two times a day (BID) | ORAL | 11 refills | Status: DC

## 2017-08-03 NOTE — Addendum Note (Signed)
Addended by: Baird LyonsPRICE, SHERRI L on: 08/03/2017 11:52 AM   Modules accepted: Orders

## 2017-08-03 NOTE — Patient Instructions (Signed)
Medication Instructions:  Your physician has recommended you make the following change in your medication:  1. INCREASE Metoprolol Tartrate to 50 mg twice daily  *If you need a refill on your cardiac medications before your next appointment, please call your pharmacy*  Labwork: None ordered  Testing/Procedures: None ordered  Follow-Up: Remote monitoring is used to monitor your Pacemaker or ICD from home. This monitoring reduces the number of office visits required to check your device to one time per year. It allows us to keep an eye on the functioning of your device to ensure it is working properly. You are scheduled for a device check from home on 10/27/2017. You may send your transmission at any time that day. If you have a wireless device, the transmission will be sent automatically. After your physician reviews your transmission, you will receive a postcard with your next transmission date.  Your physician wants you to follow-up in: 1 year with Dr. [name].  You will receive a reminder letter in the mail two months in advance. If you don't receive a letter, please call our office to schedule the follow-up appointment.  Thank you for choosing [facility]!!   Dory HornSherri Jarron Curley, RN [phone]  Any Other Special Instructions Will Be Listed Below (If Applicable).

## 2017-08-03 NOTE — Progress Notes (Signed)
Electrophysiology Office Note   Date:  08/03/2017   ID:  Alejandra Harrison, DOB 10/03/1960, MRN [id number]  PCP:  Junie SpencerHawks, Christy A, FNP  Primary Electrophysiologist:  Regan LemmingWill Martin Oluwadamilola Deliz, MD    No chief complaint on file.    History of Present Illness: Alejandra Harrison is a 57 y.o. female who presents today for electrophysiology evaluation.   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 She says that she has not been having any issues.  Today, denies symptoms of palpitations, chest pain, shortness of breath, orthopnea, PND, lower extremity edema, claudication, dizziness, presyncope, syncope, bleeding, or neurologic sequela. The patient is tolerating medications without difficulties.  Overall she is doing well.  She has no major complaints today.   Past Medical History:  Diagnosis Date  . Anxiety   . AV block, complete (HCC)   . History of seasonal allergies   . Psychosis [facility](HCC)    Past Surgical History:  Procedure Laterality Date  . COLONOSCOPY N/A 09/28/2015   Procedure: COLONOSCOPY;  Surgeon: West BaliSandi L Fields, MD;  Location: [facility] ENDO SUITE;  Service: Endoscopy;  Laterality: N/A;  1030  . EP IMPLANTABLE DEVICE N/A 05/17/2015   Procedure: Pacemaker Implant;  Surgeon: Kasper Mudrick Jorja LoaMartin Kaire Stary, MD;  Location: [facility] INVASIVE CV LAB;  Service: Cardiovascular;  Laterality: N/A;  . INSERT / REPLACE / REMOVE PACEMAKER  05/17/2015  . TUBAL LIGATION       Current Outpatient Medications  Medication Sig Dispense Refill  . ARIPiprazole (ABILIFY) 2 MG tablet Take 1/2 tab at bed time 30 tablet 3  . metoprolol tartrate (LOPRESSOR) 25 MG tablet Take 1 tablet (25 mg total) by mouth 2 (two) times daily. Please keep upcoming appt for future refills. Thank you 180 tablet 0  . Vitamin D, Ergocalciferol, (DRISDOL) 50000 units CAPS capsule Take 1 capsule (50,000 Units total) by mouth every 7 (seven) days. 12 capsule 3   Current Facility-Administered Medications  Medication  Dose Route Frequency Provider Last Rate Last Dose  . cyanocobalamin ((VITAMIN B-12)) injection 1,000 mcg  1,000 mcg Intramuscular Q30 days Jannifer RodneyHawks, Christy A, FNP   1,000 mcg at 07/15/17 [id number]    Allergies:   Oxycodone-acetaminophen and Penicillins   Social History:  The patient  reports that she has never smoked. She has never used smokeless tobacco. She reports that she does not drink alcohol or use drugs.   Family History:  The patient's family history includes Breast cancer in her paternal aunt; Dementia in her mother; Diabetes in her father.    ROS:  Please see the history of present illness.   Otherwise, review of systems is positive for none.   All other systems are reviewed and negative.   PHYSICAL EXAM: VS:  There were no vitals taken for this visit. , BMI There is no height or weight on file to calculate BMI. GEN: Well nourished, well developed, in no acute distress  HEENT: normal  Neck: no JVD, carotid bruits, or masses Cardiac: RRR; no murmurs, rubs, or gallops,no edema  Respiratory:  clear to auscultation bilaterally, normal work of breathing GI: soft, nontender, nondistended, + BS MS: no deformity or atrophy  Skin: warm and dry, device site well healed Neuro:  Strength and sensation are intact Psych: euthymic mood, full affect  EKG:  EKG is ordered today. Personal review of the ekg ordered shows A sense, V paced  Personal review of the device interrogation today. Results in Paceart   Recent  Labs: 06/08/2017: ALT 39; BUN 7; Creatinine, Ser 0.67; Hemoglobin 13.7; Platelets 314; Potassium 4.4; Sodium 141; TSH 3.290    Lipid Panel     Component Value Date/Time   CHOL 159 06/08/2017 1109   TRIG 92 06/08/2017 1109   HDL 50 06/08/2017 1109   CHOLHDL 3.2 06/08/2017 1109   LDLCALC 91 06/08/2017 1109     Wt Readings from Last 3 Encounters:  06/08/17 238 lb 9.6 oz (108.2 kg)  03/26/17 238 lb (108 kg)  07/29/16 236 lb 3.2 oz (107.1 kg)    TTE 07/16/16 - Left  ventricle: The cavity size was normal. Wall thickness was   increased increased in a pattern of mild to moderate LVH.   Systolic function was normal. The estimated ejection fraction was   in the range of 60% to 65%. Doppler parameters are consistent   with abnormal left ventricular relaxation (grade 1 diastolic   dysfunction). - Aortic valve: Mildly calcified annulus. Trileaflet; normal   thickness leaflets. Valve area (VTI): 2.55 cm^2. Valve area   (Vmax): 2.18 cm^2. Valve area (Vmean): 2.17 cm^2. - Technically adequate study.  ASSESSMENT AND PLAN:  1.  Complete heart block: Saint Jude dual-chamber pacemaker implanted 05/17/2015.  Is functioning appropriately.  No changes.  2. Nonsustained ventricular tachycardia: Has continued to have ventricular tachycardia with one run of 21 beats in April 2019.  Frederika Hukill increase metoprolol to 50 mg twice a day.   Current med no medicines are reviewed at length with the patient today.   The patient does not have concerns regarding her medicines.  The following changes were made today: None  Labs/ tests ordered today include:  No orders of the defined types were placed in this encounter.    Disposition:   FU with Harshini Trent 12 months  Signed, Korrina Zern Jorja Loa, MD  08/03/2017 8:12 AM     [facility] [address] [place] [address] [phone] (office) [phone] (fax)

## 2017-08-17 ENCOUNTER — Ambulatory Visit (INDEPENDENT_AMBULATORY_CARE_PROVIDER_SITE_OTHER): Payer: BC Managed Care – PPO | Admitting: *Deleted

## 2017-08-17 DIAGNOSIS — E538 Deficiency of other specified B group vitamins: Secondary | ICD-10-CM

## 2017-08-17 LAB — HM MAMMOGRAPHY

## 2017-08-18 LAB — CUP PACEART REMOTE DEVICE CHECK
Battery Voltage: 2.98 V
Brady Statistic AP VP Percent: 5.9 %
Brady Statistic RA Percent Paced: 5.5 %
Brady Statistic RV Percent Paced: 99 %
Implantable Lead Implant Date: 20170323
Implantable Lead Location: 753859
Implantable Lead Location: 753860
Implantable Pulse Generator Implant Date: 20170323
Lead Channel Impedance Value: 530 Ohm
Lead Channel Pacing Threshold Pulse Width: 0.4 ms
Lead Channel Pacing Threshold Pulse Width: 0.4 ms
Lead Channel Setting Pacing Amplitude: 2 V
Lead Channel Setting Pacing Pulse Width: 0.4 ms
MDC IDC LEAD IMPLANT DT: 20170323
MDC IDC MSMT BATTERY REMAINING LONGEVITY: 128 mo
MDC IDC MSMT BATTERY REMAINING PERCENTAGE: 95.5 %
MDC IDC MSMT LEADCHNL RA IMPEDANCE VALUE: 390 Ohm
MDC IDC MSMT LEADCHNL RA PACING THRESHOLD AMPLITUDE: 0.5 V
MDC IDC MSMT LEADCHNL RA SENSING INTR AMPL: 4.7 mV
MDC IDC MSMT LEADCHNL RV PACING THRESHOLD AMPLITUDE: 0.75 V
MDC IDC MSMT LEADCHNL RV SENSING INTR AMPL: 12 mV
MDC IDC SESS DTM: 20190604060005
MDC IDC SET LEADCHNL RV PACING AMPLITUDE: 1 V
MDC IDC SET LEADCHNL RV SENSING SENSITIVITY: 4 mV
MDC IDC STAT BRADY AP VS PERCENT: 1 %
MDC IDC STAT BRADY AS VP PERCENT: 93 %
MDC IDC STAT BRADY AS VS PERCENT: 1 %
Pulse Gen Serial Number: 3134337

## 2017-08-28 ENCOUNTER — Ambulatory Visit (HOSPITAL_COMMUNITY): Payer: BC Managed Care – PPO | Admitting: Psychiatry

## 2017-09-08 ENCOUNTER — Ambulatory Visit (HOSPITAL_COMMUNITY): Payer: BC Managed Care – PPO | Admitting: Psychiatry

## 2017-09-08 ENCOUNTER — Encounter (HOSPITAL_COMMUNITY): Payer: Self-pay | Admitting: Psychiatry

## 2017-09-08 VITALS — BP 142/71 | HR 66 | Ht 65.0 in | Wt 243.0 lb

## 2017-09-08 DIAGNOSIS — F23 Brief psychotic disorder: Secondary | ICD-10-CM | POA: Diagnosis not present

## 2017-09-08 MED ORDER — ARIPIPRAZOLE 2 MG PO TABS: 1.0000 mg | ORAL_TABLET | Freq: Every day | ORAL | 5 refills | Status: DC

## 2017-09-08 NOTE — Progress Notes (Signed)
BH MD/PA/NP OP Progress Note  09/08/2017 1:59 PM Alejandra Harrison  MRN:  [id number]  Chief Complaint:  Chief Complaint    Hallucinations; Follow-up     HPI: As patient is a 57 year old married white female who lives with her husband and 2 sonsin [place]. 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  The patient states that in 2012 she started seeing a figure that she thought may have been God. She also got fixated on a man in her Sunday school and thought that her husband would die and she would have to marry this man. She was having lots of religious visions. She claims that she had not been on any medication or using drugs or alcohol at that time. She had not had a head injury or any additional stress. She may of been going through menopause. She denied visual hallucinations but claims she felt "high and on top of the world." She's never had a history of depression however.  At any rate she was seen here by Dr. Toni Arthurs and started on Abilify. She was on a higher dose such as 5 mg in the past and it caused akathisia. Currently she takes 1 mg per day and it seems to be working fairly well. She's no longer having visions or unusual thoughts and she's functioning well in her job. She's not having significant side effects  The patient returns after 6 months.  She states that she is doing well.  [name] had no further delusions or hallucinations.  [name] sleeping well and is not having racing thoughts or any other thought disturbance.  She denies feeling "high" or manic.  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  Visit Diagnosis:    ICD-10-CM   1. Brief psychotic disorder (HCC) F23     Past Psychiatric History: none  Past Medical History:  Past Medical History:  Diagnosis Date  . Anxiety   . AV block, complete (HCC)   . History of seasonal allergies   . Psychosis [facility])     Past Surgical History:   Procedure Laterality Date  . COLONOSCOPY N/A 09/28/2015   Procedure: COLONOSCOPY;  Surgeon: West Bali, MD;  [facility];  Service: Endoscopy;  Laterality: N/A;  1030  . EP IMPLANTABLE DEVICE N/A 05/17/2015   Procedure: Pacemaker Implant;  Surgeon: Will Jorja Loa, MD;  [facility];  Service: Cardiovascular;  Laterality: N/A;  . INSERT / REPLACE / REMOVE PACEMAKER  05/17/2015  . TUBAL LIGATION      Family Psychiatric History: See below  Family History:  Family History  Problem Relation Age of Onset  . Dementia Mother   . Diabetes Father   . Breast cancer Paternal Aunt   . ADD / ADHD Neg Hx   . Alcohol abuse Neg Hx   . Drug abuse Neg Hx   . Anxiety disorder Neg Hx   . Bipolar disorder Neg Hx   . Depression Neg Hx   . OCD Neg Hx   . Paranoid behavior Neg Hx   . Schizophrenia Neg Hx   . Seizures Neg Hx   . Sexual abuse Neg Hx   . Physical abuse Neg Hx   . Colon cancer Neg Hx     Social History:  Social History   Socioeconomic History  . Marital status: Married    Spouse  name: Not on file  . Number of children: Not on file  . Years of education: Not on file  . Highest education level: Not on file  Occupational History  . Not on file  Social Needs  . Financial resource strain: Not on file  . Food insecurity:    Worry: Not on file    Inability: Not on file  . Transportation needs:    Medical: Not on file    Non-medical: Not on file  Tobacco Use  . Smoking status: Never Smoker  . Smokeless tobacco: Never Used  . Tobacco comment: Never smoked  Substance and Sexual Activity  . Alcohol use: No    Alcohol/week: 0.0 oz  . Drug use: No  . Sexual activity: Yes    Birth control/protection: Post-menopausal  Lifestyle  . Physical activity:    Days per week: Not on file    Minutes per session: Not on file  . Stress: Not on file  Relationships  . Social connections:    Talks on phone: Not on file    Gets together: Not on file     Attends religious service: Not on file    Active member of club or organization: Not on file    Attends meetings of clubs or organizations: Not on file    Relationship status: Not on file  Other Topics Concern  . Not on file  Social History Narrative  . Not on file    Allergies:  Allergies  Allergen Reactions  . Oxycodone-Acetaminophen Hives and Other (See Comments)    Hands peeled  . Penicillins Hives and Other (See Comments)    Hands peeled    Metabolic Disorder Labs: No results found for: HGBA1C, MPG No results found for: PROLACTIN Lab Results  Component Value Date   CHOL 159 06/08/2017   TRIG 92 06/08/2017   HDL 50 06/08/2017   CHOLHDL 3.2 06/08/2017   LDLCALC 91 06/08/2017   LDLCALC 91 05/19/2016   Lab Results  Component Value Date   TSH 3.290 06/08/2017   TSH 2.710 05/19/2016    Therapeutic Level Labs: No results found for: LITHIUM No results found for: VALPROATE No components found for:  CBMZ  Current Medications: Current Outpatient Medications  Medication Sig Dispense Refill  . ARIPiprazole (ABILIFY) 2 MG tablet Take 0.5 tablets (1 mg total) by mouth daily. 30 tablet 5  . Cyanocobalamin (VITAMIN B-12 IJ) Use as directed once a month.    . metoprolol tartrate (LOPRESSOR) 50 MG tablet Take 1 tablet (50 mg total) by mouth 2 (two) times daily. 60 tablet 11  . Vitamin D, Ergocalciferol, (DRISDOL) 50000 units CAPS capsule Take 1 capsule (50,000 Units total) by mouth every 7 (seven) days. 12 capsule 3   Current Facility-Administered Medications  Medication Dose Route Frequency Provider Last Rate Last Dose  . cyanocobalamin ((VITAMIN B-12)) injection 1,000 mcg  1,000 mcg Intramuscular Q30 days Junie SpencerHawks, Christy A, FNP   1,000 mcg at 08/17/17 [id number]     Musculoskeletal: Strength & Muscle Tone: within normal limits Gait & Station: normal Patient leans: N/A  Psychiatric Specialty Exam: Review of Systems  All other systems reviewed and are negative.   Blood  pressure (!) 142/71, pulse 66, height 5\' 5"  (1.651 m), weight 243 lb (110.2 kg), SpO2 96 %.Body mass index is 40.44 kg/m.  General Appearance: Casual, Neat and Well Groomed  Eye Contact:  Good  Speech:  Clear and Coherent  Volume:  Normal  Mood:  Euthymic  Affect:  Congruent  Thought Process:  Goal Directed  Orientation:  Full (Time, Place, and Person)  Thought Content: WDL   Suicidal Thoughts:  No  Homicidal Thoughts:  No  Memory:  Immediate;   Good Recent;   Good Remote;   Good  Judgement:  Good  Insight:  Fair  Psychomotor Activity:  Normal  Concentration:  Concentration: Good and Attention Span: Good  Recall:  Good  Fund of Knowledge: Good  Language: Good  Akathisia:  No  Handed:  Right  AIMS (if indicated): not done  Assets:  Communication Skills Desire for Improvement Resilience Social Support Talents/Skills  ADL's:  Intact  Cognition: WNL  Sleep:  Good   Screenings: PHQ2-9     Office Visit from 06/08/2017 in [facility] Office Visit from 03/26/2017 in [facility] Office Visit from 05/19/2016 in [facility] Office Visit from 03/14/2016 in [facility] Office Visit from 05/07/2015 in [facility]  PHQ-2 Total Score  0  0  0  0  0       Assessment and Plan: This patient is a 57 year old female with a history of short-term psychotic disorder.  [name] maintaining well on Abilify 1 mg daily.  She also has first-degree heart block and is doing well with the metoprolol and her pacemaker.  She will continue on the current regimen and return to see me in 6 months   [name], MD 09/08/2017, 1:59 PM

## 2017-09-17 ENCOUNTER — Ambulatory Visit (INDEPENDENT_AMBULATORY_CARE_PROVIDER_SITE_OTHER): Payer: BC Managed Care – PPO | Admitting: *Deleted

## 2017-09-17 DIAGNOSIS — E538 Deficiency of other specified B group vitamins: Secondary | ICD-10-CM | POA: Diagnosis not present

## 2017-09-17 NOTE — Progress Notes (Signed)
Pt given Cyanocobalamin inj Tolerated well 

## 2017-09-29 NOTE — Progress Notes (Signed)
n Care Everywhere Cat 1

## 2017-10-19 ENCOUNTER — Ambulatory Visit (INDEPENDENT_AMBULATORY_CARE_PROVIDER_SITE_OTHER): Payer: BC Managed Care – PPO | Admitting: *Deleted

## 2017-10-19 DIAGNOSIS — E538 Deficiency of other specified B group vitamins: Secondary | ICD-10-CM

## 2017-10-19 NOTE — Progress Notes (Signed)
Pt given Cyanocobalamin inj Tolerated well 

## 2017-10-27 ENCOUNTER — Ambulatory Visit (INDEPENDENT_AMBULATORY_CARE_PROVIDER_SITE_OTHER): Payer: BC Managed Care – PPO | Admitting: *Deleted

## 2017-10-27 ENCOUNTER — Telehealth: Payer: Self-pay | Admitting: Cardiology

## 2017-10-27 DIAGNOSIS — I442 Atrioventricular block, complete: Secondary | ICD-10-CM

## 2017-10-27 NOTE — Telephone Encounter (Signed)
LMOVM reminding pt to send remote transmission.   

## 2017-10-28 NOTE — Progress Notes (Signed)
Remote pacemaker transmission.   

## 2017-11-19 ENCOUNTER — Ambulatory Visit (INDEPENDENT_AMBULATORY_CARE_PROVIDER_SITE_OTHER): Payer: BC Managed Care – PPO | Admitting: *Deleted

## 2017-11-19 DIAGNOSIS — E538 Deficiency of other specified B group vitamins: Secondary | ICD-10-CM

## 2017-11-19 NOTE — Progress Notes (Signed)
Pt given Cyanocobalamin inj Tolerated well 

## 2017-11-23 LAB — CUP PACEART REMOTE DEVICE CHECK
Battery Remaining Longevity: 129 mo
Battery Remaining Percentage: 95.5 %
Battery Voltage: 2.98 V
Brady Statistic AP VS Percent: 1 %
Brady Statistic RA Percent Paced: 6.7 %
Brady Statistic RV Percent Paced: 99 %
Date Time Interrogation Session: 20190903203759
Implantable Lead Implant Date: 20170323
Implantable Lead Location: 753859
Implantable Lead Location: 753860
Implantable Pulse Generator Implant Date: 20170323
Lead Channel Impedance Value: 430 Ohm
Lead Channel Pacing Threshold Amplitude: 0.625 V
Lead Channel Pacing Threshold Pulse Width: 0.4 ms
Lead Channel Sensing Intrinsic Amplitude: 4.7 mV
Lead Channel Setting Pacing Amplitude: 2 V
Lead Channel Setting Pacing Pulse Width: 0.4 ms
Lead Channel Setting Sensing Sensitivity: 4 mV
MDC IDC LEAD IMPLANT DT: 20170323
MDC IDC MSMT LEADCHNL RA PACING THRESHOLD AMPLITUDE: 0.5 V
MDC IDC MSMT LEADCHNL RV IMPEDANCE VALUE: 540 Ohm
MDC IDC MSMT LEADCHNL RV PACING THRESHOLD PULSEWIDTH: 0.4 ms
MDC IDC MSMT LEADCHNL RV SENSING INTR AMPL: 12 mV
MDC IDC SET LEADCHNL RV PACING AMPLITUDE: 0.875
MDC IDC STAT BRADY AP VP PERCENT: 7.2 %
MDC IDC STAT BRADY AS VP PERCENT: 92 %
MDC IDC STAT BRADY AS VS PERCENT: 1 %
Pulse Gen Model: 2272
Pulse Gen Serial Number: 3134337

## 2017-12-21 ENCOUNTER — Ambulatory Visit (INDEPENDENT_AMBULATORY_CARE_PROVIDER_SITE_OTHER): Payer: BC Managed Care – PPO | Admitting: *Deleted

## 2017-12-21 DIAGNOSIS — E538 Deficiency of other specified B group vitamins: Secondary | ICD-10-CM | POA: Diagnosis not present

## 2017-12-21 NOTE — Progress Notes (Signed)
Pt given Cyanocobalamin inj Tolerated well 

## 2018-01-22 ENCOUNTER — Ambulatory Visit (INDEPENDENT_AMBULATORY_CARE_PROVIDER_SITE_OTHER): Payer: BC Managed Care – PPO | Admitting: *Deleted

## 2018-01-22 DIAGNOSIS — E538 Deficiency of other specified B group vitamins: Secondary | ICD-10-CM | POA: Diagnosis not present

## 2018-01-22 NOTE — Progress Notes (Signed)
Pt given Cyanocobalamin inj Tolerated well 

## 2018-01-26 ENCOUNTER — Ambulatory Visit (INDEPENDENT_AMBULATORY_CARE_PROVIDER_SITE_OTHER): Payer: BC Managed Care – PPO

## 2018-01-26 DIAGNOSIS — I442 Atrioventricular block, complete: Secondary | ICD-10-CM | POA: Diagnosis not present

## 2018-01-27 NOTE — Progress Notes (Signed)
Remote pacemaker transmission.   

## 2018-02-02 ENCOUNTER — Encounter: Payer: Self-pay | Admitting: Cardiology

## 2018-02-18 ENCOUNTER — Ambulatory Visit: Payer: BC Managed Care – PPO | Admitting: Family Medicine

## 2018-02-18 ENCOUNTER — Encounter: Payer: Self-pay | Admitting: Family Medicine

## 2018-02-18 VITALS — BP 133/88 | HR 71 | Temp 96.9°F | Ht 65.0 in | Wt 249.6 lb

## 2018-02-18 DIAGNOSIS — R300 Dysuria: Secondary | ICD-10-CM | POA: Diagnosis not present

## 2018-02-18 LAB — URINALYSIS, COMPLETE
BILIRUBIN UA: NEGATIVE
Glucose, UA: NEGATIVE
KETONES UA: NEGATIVE
Nitrite, UA: NEGATIVE
PH UA: 7 (ref 5.0–7.5)
Protein, UA: NEGATIVE
RBC UA: NEGATIVE
SPEC GRAV UA: 1.01 (ref 1.005–1.030)
Urobilinogen, Ur: 0.2 mg/dL (ref 0.2–1.0)

## 2018-02-18 LAB — MICROSCOPIC EXAMINATION
RBC, UA: NONE SEEN /hpf (ref 0–2)
RENAL EPITHEL UA: NONE SEEN /HPF

## 2018-02-18 MED ORDER — NITROFURANTOIN MONOHYD MACRO 100 MG PO CAPS: 100.0000 mg | ORAL_CAPSULE | Freq: Two times a day (BID) | ORAL | 0 refills | Status: DC

## 2018-02-18 NOTE — Progress Notes (Signed)
Chief Complaint  Patient presents with  . Dysuria    pressure to urinate  . lower abdominal discomfort    HPI  Patient presents today for burning with urination and frequency for several days. Denies fever . No flank pain. No nausea, vomiting. Feels a griping pain at the suprapubic region after urination.   PMH: Smoking status noted ROS: Per HPI  Objective: BP 133/88   Pulse 71   Temp (!) 96.9 F (36.1 C) (Oral)   Ht 5\' 5"  (1.651 m)   Wt 249 lb 9.6 oz (113.2 kg)   BMI 41.54 kg/m  Gen: NAD, alert, cooperative with exam CV: RRR, good S1/S2, no murmur Resp: CTABL, no wheezes, non-labored Abd: mild suprapubic tenderness Ext: No edema, warm Neuro: Alert and oriented, No gross deficits  Assessment and plan:  1. Dysuria     Meds ordered this encounter  Medications  . nitrofurantoin, macrocrystal-monohydrate, (MACROBID) 100 MG capsule    Sig: Take 1 capsule (100 mg total) by mouth 2 (two) times daily.    Dispense:  14 capsule    Refill:  0    Orders Placed This Encounter  Procedures  . Urine Culture  . Urinalysis, Complete    Follow up as needed.  Mechele ClaudeWarren Ofelia Podolski, MD

## 2018-02-20 LAB — URINE CULTURE

## 2018-02-22 ENCOUNTER — Ambulatory Visit (INDEPENDENT_AMBULATORY_CARE_PROVIDER_SITE_OTHER): Payer: BC Managed Care – PPO

## 2018-02-22 DIAGNOSIS — E538 Deficiency of other specified B group vitamins: Secondary | ICD-10-CM

## 2018-02-26 ENCOUNTER — Telehealth: Payer: Self-pay | Admitting: Family

## 2018-02-26 ENCOUNTER — Ambulatory Visit: Payer: BC Managed Care – PPO | Admitting: Nurse Practitioner

## 2018-02-26 ENCOUNTER — Encounter (HOSPITAL_COMMUNITY): Payer: Self-pay | Admitting: Psychiatry

## 2018-02-26 ENCOUNTER — Encounter: Payer: Self-pay | Admitting: Nurse Practitioner

## 2018-02-26 ENCOUNTER — Ambulatory Visit (HOSPITAL_COMMUNITY): Payer: BC Managed Care – PPO | Admitting: Psychiatry

## 2018-02-26 VITALS — BP 132/87 | HR 68 | Ht 64.75 in | Wt 243.0 lb

## 2018-02-26 VITALS — BP 132/85 | HR 71 | Temp 98.1°F | Ht 64.0 in | Wt 245.0 lb

## 2018-02-26 DIAGNOSIS — N3000 Acute cystitis without hematuria: Secondary | ICD-10-CM

## 2018-02-26 DIAGNOSIS — F23 Brief psychotic disorder: Secondary | ICD-10-CM

## 2018-02-26 DIAGNOSIS — R300 Dysuria: Secondary | ICD-10-CM

## 2018-02-26 LAB — URINALYSIS, COMPLETE
BILIRUBIN UA: NEGATIVE
Glucose, UA: NEGATIVE
KETONES UA: NEGATIVE
NITRITE UA: NEGATIVE
PROTEIN UA: NEGATIVE
RBC UA: NEGATIVE
Specific Gravity, UA: <1.005 — ABNORMAL LOW (ref 1.005–1.030)
UUROB: 0.2 mg/dL (ref 0.2–1.0)
pH, UA: 7 (ref 5.0–7.5)

## 2018-02-26 LAB — MICROSCOPIC EXAMINATION: RBC, UA: NONE SEEN /hpf (ref 0–2)

## 2018-02-26 MED ORDER — DOXYCYCLINE HYCLATE 100 MG PO TABS: 100.0000 mg | ORAL_TABLET | Freq: Two times a day (BID) | ORAL | 0 refills | Status: DC

## 2018-02-26 MED ORDER — ARIPIPRAZOLE 2 MG PO TABS: 1.0000 mg | ORAL_TABLET | Freq: Every day | ORAL | 5 refills | Status: DC

## 2018-02-26 NOTE — Progress Notes (Signed)
   Subjective:    Patient ID: Alejandra Harrison, female    DOB: 02-16-1961, 58 y.o.   MRN: [id number]   Chief Complaint: Dysuria (Took last macrobid yesterday) and Abdominal Pain   HPI Patient come sin c/o dysuria and urinary frequency. She was seen on 02/18/18 with same complaint and was given macrobid. She finished the meds yesterday morning and this morning the symptoms came back.   Review of Systems  Constitutional: Negative.   HENT: Negative.   Respiratory: Negative.   Genitourinary: Positive for dysuria, frequency and urgency.  Neurological: Negative.   Psychiatric/Behavioral: Negative.   All other systems reviewed and are negative.      Objective:   Physical Exam Vitals signs and nursing note reviewed.  Constitutional:      General: [name] in acute distress (mild).     Appearance: [name] well-developed.  Cardiovascular:     Rate and Rhythm: Normal rate and regular rhythm.  Pulmonary:     Effort: Pulmonary effort is normal.     Breath sounds: Normal breath sounds.  Abdominal:     General: Abdomen is flat. Bowel sounds are normal.     Palpations: Abdomen is soft.     Tenderness: There is no abdominal tenderness.  Skin:    General: Skin is warm and dry.  Neurological:     Mental Status: [name] alert.     BP 132/85   Pulse 71   Temp 98.1 F (36.7 C) (Oral)   Ht 5\' 4"  (1.626 m)   Wt 245 lb (111.1 kg)   BMI 42.05 kg/m   UA-still contains leuks     Assessment & Plan:  Alejandra Harrison in today with chief complaint of Dysuria (Took last macrobid yesterday) and Abdominal Pain   1. Dysuria - Urinalysis, Complete  2. Acute cystitis without hematuria Take medication as prescribe Cotton underwear Take shower not bath Cranberry juice, yogurt Force fluids AZO over the counter X2 days Culture pending RTO prn  - doxycycline (VIBRA-TABS) 100 MG tablet; Take 1 tablet (100 mg total) by mouth 2 (two) times daily. 1 po bid  Dispense: 14 tablet; Refill: 0 - Urine  Culture  Mary-Margaret Daphine Deutscher, FNP

## 2018-02-26 NOTE — Progress Notes (Signed)
BH MD/PA/NP OP Progress Note  02/26/2018 9:23 AM Alejandra Harrison  MRN:  [id number]  Chief Complaint:  Chief Complaint    Hallucinations; Follow-up     WUJ:WJXB patient is a 58 year old married white female who lives with her husband and 2 sonsin [place]. 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  The patient states that in 2012 she started seeing a figure that she thought may have been God. She also got fixated on a man in her Sunday school and thought that her husband would die and she would have to marry this man. She was having lots of religious visions. She claims that she had not been on any medication or using drugs or alcohol at that time. She had not had a head injury or any additional stress. She may of been going through menopause. She denied visual hallucinations but claims she felt "high and on top of the world." She's never had a history of depression however.  At any rate she was seen here by Dr. Toni Arthurs and started on Abilify. She was on a higher dose such as 5 mg in the past and it caused akathisia. Currently she takes 1 mg per day and it seems to be working fairly well. She's no longer having visions or unusual thoughts and she's functioning well in her job. She's not having significant side effects  The patient returns after 6 months.  [name] doing very well.  She continues to work and enjoys her job for the most part.  She denies any further delusions religious preoccupations or hallucinations.  She has had no side effects from the Abilify such as twitching or jerking.  She still having a cardiac pacemaker and has no difficulties with this.  [name] receiving B12 injections.  [name] sleeping well and her mood is good. Visit Diagnosis:    ICD-10-CM   1. Brief psychotic disorder (HCC) F23     Past Psychiatric History: none  Past Medical History:  Past Medical History:  Diagnosis Date  . Anxiety   . AV block, complete (HCC)   . History of seasonal  allergies   . Psychosis [facility])     Past Surgical History:  Procedure Laterality Date  . COLONOSCOPY N/A 09/28/2015   Procedure: COLONOSCOPY;  Surgeon: West Bali, MD;  [facility];  Service: Endoscopy;  Laterality: N/A;  1030  . EP IMPLANTABLE DEVICE N/A 05/17/2015   Procedure: Pacemaker Implant;  Surgeon: Will Jorja Loa, MD;  [facility];  Service: Cardiovascular;  Laterality: N/A;  . INSERT / REPLACE / REMOVE PACEMAKER  05/17/2015  . TUBAL LIGATION      Family Psychiatric History: none  Family History:  Family History  Problem Relation Age of Onset  . Dementia Mother   . Diabetes Father   . Breast cancer Paternal Aunt   . ADD / ADHD Neg Hx   . Alcohol abuse Neg Hx   . Drug abuse Neg Hx   . Anxiety disorder Neg Hx   . Bipolar disorder Neg Hx   . Depression Neg Hx   . OCD Neg Hx   . Paranoid behavior Neg Hx   . Schizophrenia Neg Hx   . Seizures Neg Hx   . Sexual abuse Neg Hx   . Physical abuse Neg Hx   . Colon cancer Neg Hx     Social History:  Social History   Socioeconomic History  . Marital  status: Married    Spouse name: Not on file  . Number of children: Not on file  . Years of education: Not on file  . Highest education level: Not on file  Occupational History  . Not on file  Social Needs  . Financial resource strain: Not on file  . Food insecurity:    Worry: Not on file    Inability: Not on file  . Transportation needs:    Medical: Not on file    Non-medical: Not on file  Tobacco Use  . Smoking status: Never Smoker  . Smokeless tobacco: Never Used  . Tobacco comment: Never smoked  Substance and Sexual Activity  . Alcohol use: No    Alcohol/week: 0.0 standard drinks  . Drug use: No  . Sexual activity: Not Currently    Birth control/protection: Post-menopausal  Lifestyle  . Physical activity:    Days per week: Not on file    Minutes per session: Not on file  . Stress: Not on file  Relationships  . Social  connections:    Talks on phone: Not on file    Gets together: Not on file    Attends religious service: Not on file    Active member of club or organization: Not on file    Attends meetings of clubs or organizations: Not on file    Relationship status: Not on file  Other Topics Concern  . Not on file  Social History Narrative  . Not on file    Allergies:  Allergies  Allergen Reactions  . Oxycodone-Acetaminophen Hives and Other (See Comments)    Hands peeled  . Penicillins Hives and Other (See Comments)    Hands peeled    Metabolic Disorder Labs: No results found for: HGBA1C, MPG No results found for: PROLACTIN Lab Results  Component Value Date   CHOL 159 06/08/2017   TRIG 92 06/08/2017   HDL 50 06/08/2017   CHOLHDL 3.2 06/08/2017   LDLCALC 91 06/08/2017   LDLCALC 91 05/19/2016   Lab Results  Component Value Date   TSH 3.290 06/08/2017   TSH 2.710 05/19/2016    Therapeutic Level Labs: No results found for: LITHIUM No results found for: VALPROATE No components found for:  CBMZ  Current Medications: Current Outpatient Medications  Medication Sig Dispense Refill  . ARIPiprazole (ABILIFY) 2 MG tablet Take 0.5 tablets (1 mg total) by mouth daily. 30 tablet 5  . Cyanocobalamin (VITAMIN B-12 IJ) Use as directed once a month.    . Vitamin D, Ergocalciferol, (DRISDOL) 50000 units CAPS capsule Take 1 capsule (50,000 Units total) by mouth every 7 (seven) days. 12 capsule 3  . metoprolol tartrate (LOPRESSOR) 50 MG tablet Take 1 tablet (50 mg total) by mouth 2 (two) times daily. 60 tablet 11  . nitrofurantoin, macrocrystal-monohydrate, (MACROBID) 100 MG capsule Take 1 capsule (100 mg total) by mouth 2 (two) times daily. (Patient not taking: Reported on 02/26/2018) 14 capsule 0   Current Facility-Administered Medications  Medication Dose Route Frequency Provider Last Rate Last Dose  . cyanocobalamin ((VITAMIN B-12)) injection 1,000 mcg  1,000 mcg Intramuscular Q30 days Junie SpencerHawks,  Christy A, FNP   1,000 mcg at 02/22/18 [id number]     Musculoskeletal: Strength & Muscle Tone: within normal limits Gait & Station: normal Patient leans: N/A  Psychiatric Specialty Exam: Review of Systems  All other systems reviewed and are negative.   Blood pressure 132/87, pulse 68, height 5' 4.75" (1.645 m), weight 243 lb (110.2 kg).Body mass index  is 40.75 kg/m.  General Appearance: Casual and Fairly Groomed  Eye Contact:  Good  Speech:  Clear and Coherent  Volume:  Normal  Mood:  Euthymic  Affect:  Congruent  Thought Process:  Goal Directed  Orientation:  Full (Time, Place, and Person)  Thought Content: WDL   Suicidal Thoughts:  No  Homicidal Thoughts:  No  Memory:  Immediate;   Good Recent;   Good Remote;   Fair  Judgement:  Good  Insight:  Good  Psychomotor Activity:  Normal  Concentration:  Concentration: Good and Attention Span: Good  Recall:  Good  Fund of Knowledge: Fair  Language: Good  Akathisia:  No  Handed:  Right  AIMS (if indicated): not done  Assets:  Communication Skills Desire for Improvement Resilience Social Support Talents/Skills  ADL's:  Intact  Cognition: WNL  Sleep:  Good   Screenings: PHQ2-9     [facility]2017 [facility] from [facility]/2016 in [facility]  PHQ-2 Total Score  0  0  0  0  0       Assessment and Plan: Patient is a 58 year old female who had suffered a brief psychotic episode a few years ago.  She has done well on Abilify 1 mg daily without recurrence or side effect.  She will continue this dosage and return to see me in 6 months or call sooner if needed   Alejandra Harrison , MD 02/26/2018, 9:23 AM

## 2018-02-26 NOTE — Telephone Encounter (Signed)
Pt came in for appt. 

## 2018-02-26 NOTE — Patient Instructions (Signed)
Urinary Tract Infection, Adult A urinary tract infection (UTI) is an infection of any part of the urinary tract. The urinary tract includes:  The kidneys.  The ureters.  The bladder.  The urethra. These organs make, store, and get rid of pee (urine) in the body. What are the causes? This is caused by germs (bacteria) in your genital area. These germs grow and cause swelling (inflammation) of your urinary tract. What increases the risk? You are more likely to develop this condition if:  You have a small, thin tube (catheter) to drain pee.  You cannot control when you pee or poop (incontinence).  You are female, and: ? You use these methods to prevent pregnancy: ? A medicine that kills sperm (spermicide). ? A device that blocks sperm (diaphragm). ? You have low levels of a female hormone (estrogen). ? You are pregnant.  You have genes that add to your risk.  You are sexually active.  You take antibiotic medicines.  You have trouble peeing because of: ? A prostate that is bigger than normal, if you are female. ? A blockage in the part of your body that drains pee from the bladder (urethra). ? A kidney stone. ? A nerve condition that affects your bladder (neurogenic bladder). ? Not getting enough to drink. ? Not peeing often enough.  You have other conditions, such as: ? Diabetes. ? A weak disease-fighting system (immune system). ? Sickle cell disease. ? Gout. ? Injury of the spine. What are the signs or symptoms? Symptoms of this condition include:  Needing to pee right away (urgently).  Peeing often.  Peeing small amounts often.  Pain or burning when peeing.  Blood in the pee.  Pee that smells bad or not like normal.  Trouble peeing.  Pee that is cloudy.  Fluid coming from the vagina, if you are female.  Pain in the belly or lower back. Other symptoms include:  Throwing up (vomiting).  No urge to eat.  Feeling mixed up (confused).  Being tired  and grouchy (irritable).  A fever.  Watery poop (diarrhea). How is this treated? This condition may be treated with:  Antibiotic medicine.  Other medicines.  Drinking enough water. Follow these instructions at home:  Medicines  Take over-the-counter and prescription medicines only as told by your doctor.  If you were prescribed an antibiotic medicine, take it as told by your doctor. Do not stop taking it even if you start to feel better. General instructions  Make sure you: ? Pee until your bladder is empty. ? Do not hold pee for a long time. ? Empty your bladder after sex. ? Wipe from front to back after pooping if you are a female. Use each tissue one time when you wipe.  Drink enough fluid to keep your pee pale yellow.  Keep all follow-up visits as told by your doctor. This is important. Contact a doctor if:  You do not get better after 1-2 days.  Your symptoms go away and then come back. Get help right away if:  You have very bad back pain.  You have very bad pain in your lower belly.  You have a fever.  You are sick to your stomach (nauseous).  You are throwing up. Summary  A urinary tract infection (UTI) is an infection of any part of the urinary tract.  This condition is caused by germs in your genital area.  There are many risk factors for a UTI. These include having a small, thin   tube to drain pee and not being able to control when you pee or poop.  Treatment includes antibiotic medicines for germs.  Drink enough fluid to keep your pee pale yellow. This information is not intended to replace advice given to you by your health care provider. Make sure you discuss any questions you have with your health care provider. Document Released: 07/30/2007 Document Revised: 08/20/2017 Document Reviewed: 08/20/2017 Elsevier Interactive Patient Education  2019 [employer]  

## 2018-02-27 LAB — URINE CULTURE

## 2018-03-18 LAB — CUP PACEART REMOTE DEVICE CHECK
Battery Remaining Longevity: 129 mo
Battery Remaining Percentage: 95.5 %
Battery Voltage: 2.98 V
Brady Statistic AP VP Percent: 8.8 %
Brady Statistic AP VS Percent: 1 %
Brady Statistic AS VP Percent: 90 %
Brady Statistic RA Percent Paced: 8.4 %
Brady Statistic RV Percent Paced: 99 %
Date Time Interrogation Session: 20191203230514
Implantable Lead Implant Date: 20170323
Implantable Lead Implant Date: 20170323
Implantable Lead Location: 753860
Implantable Pulse Generator Implant Date: 20170323
Lead Channel Impedance Value: 450 Ohm
Lead Channel Impedance Value: 510 Ohm
Lead Channel Pacing Threshold Amplitude: 0.5 V
Lead Channel Pacing Threshold Amplitude: 0.625 V
Lead Channel Pacing Threshold Pulse Width: 0.4 ms
Lead Channel Sensing Intrinsic Amplitude: 12 mV
Lead Channel Sensing Intrinsic Amplitude: 3.1 mV
Lead Channel Setting Pacing Amplitude: 0.875
Lead Channel Setting Pacing Amplitude: 2 V
Lead Channel Setting Pacing Pulse Width: 0.4 ms
MDC IDC LEAD LOCATION: 753859
MDC IDC MSMT LEADCHNL RV PACING THRESHOLD PULSEWIDTH: 0.4 ms
MDC IDC SET LEADCHNL RV SENSING SENSITIVITY: 4 mV
MDC IDC STAT BRADY AS VS PERCENT: 1 %
Pulse Gen Model: 2272
Pulse Gen Serial Number: 3134337

## 2018-03-26 ENCOUNTER — Ambulatory Visit (INDEPENDENT_AMBULATORY_CARE_PROVIDER_SITE_OTHER): Payer: BC Managed Care – PPO | Admitting: *Deleted

## 2018-03-26 DIAGNOSIS — E538 Deficiency of other specified B group vitamins: Secondary | ICD-10-CM | POA: Diagnosis not present

## 2018-03-26 NOTE — Progress Notes (Signed)
Pt given cyanocobalamin inj Tolerated well 

## 2018-04-27 ENCOUNTER — Ambulatory Visit (INDEPENDENT_AMBULATORY_CARE_PROVIDER_SITE_OTHER): Payer: BC Managed Care – PPO | Admitting: *Deleted

## 2018-04-27 DIAGNOSIS — I442 Atrioventricular block, complete: Secondary | ICD-10-CM | POA: Diagnosis not present

## 2018-04-27 DIAGNOSIS — E538 Deficiency of other specified B group vitamins: Secondary | ICD-10-CM

## 2018-04-27 DIAGNOSIS — I472 Ventricular tachycardia, unspecified: Secondary | ICD-10-CM

## 2018-04-27 NOTE — Progress Notes (Signed)
Pt given Cyanocobalamin inj Tolerated well 

## 2018-04-28 LAB — CUP PACEART REMOTE DEVICE CHECK
Battery Remaining Longevity: 123 mo
Battery Voltage: 2.96 V
Brady Statistic AP VP Percent: 8.9 %
Brady Statistic AP VS Percent: 1 %
Brady Statistic AS VP Percent: 90 %
Brady Statistic AS VS Percent: 1 %
Brady Statistic RV Percent Paced: 99 %
Date Time Interrogation Session: 20200303214404
Implantable Lead Implant Date: 20170323
Implantable Lead Location: 753859
Lead Channel Impedance Value: 440 Ohm
Lead Channel Impedance Value: 600 Ohm
Lead Channel Pacing Threshold Amplitude: 0.5 V
Lead Channel Pacing Threshold Amplitude: 0.625 V
Lead Channel Pacing Threshold Pulse Width: 0.4 ms
Lead Channel Pacing Threshold Pulse Width: 0.4 ms
Lead Channel Sensing Intrinsic Amplitude: 12 mV
Lead Channel Setting Pacing Amplitude: 0.875
Lead Channel Setting Pacing Amplitude: 2 V
Lead Channel Setting Pacing Pulse Width: 0.4 ms
Lead Channel Setting Sensing Sensitivity: 4 mV
MDC IDC LEAD IMPLANT DT: 20170323
MDC IDC LEAD LOCATION: 753860
MDC IDC MSMT BATTERY REMAINING PERCENTAGE: 95.5 %
MDC IDC MSMT LEADCHNL RA SENSING INTR AMPL: 3.6 mV
MDC IDC PG IMPLANT DT: 20170323
MDC IDC STAT BRADY RA PERCENT PACED: 8.4 %
Pulse Gen Model: 2272
Pulse Gen Serial Number: 3134337

## 2018-05-05 NOTE — Progress Notes (Signed)
Remote pacemaker transmission.   

## 2018-06-10 ENCOUNTER — Encounter: Payer: BC Managed Care – PPO | Admitting: Family

## 2018-07-27 ENCOUNTER — Ambulatory Visit (INDEPENDENT_AMBULATORY_CARE_PROVIDER_SITE_OTHER): Payer: BC Managed Care – PPO | Admitting: *Deleted

## 2018-07-27 DIAGNOSIS — I442 Atrioventricular block, complete: Secondary | ICD-10-CM

## 2018-07-28 LAB — CUP PACEART REMOTE DEVICE CHECK
Battery Remaining Longevity: 122 mo
Battery Remaining Percentage: 95.5 %
Battery Voltage: 2.96 V
Brady Statistic AP VP Percent: 9.1 %
Brady Statistic AP VS Percent: 1 %
Brady Statistic AS VP Percent: 90 %
Brady Statistic AS VS Percent: 1 %
Brady Statistic RA Percent Paced: 8.6 %
Brady Statistic RV Percent Paced: 99 %
Date Time Interrogation Session: 20200602083021
Implantable Lead Implant Date: 20170323
Implantable Lead Implant Date: 20170323
Implantable Lead Location: 753859
Implantable Lead Location: 753860
Implantable Pulse Generator Implant Date: 20170323
Lead Channel Impedance Value: 480 Ohm
Lead Channel Impedance Value: 540 Ohm
Lead Channel Pacing Threshold Amplitude: 0.5 V
Lead Channel Pacing Threshold Amplitude: 0.875 V
Lead Channel Pacing Threshold Pulse Width: 0.4 ms
Lead Channel Pacing Threshold Pulse Width: 0.4 ms
Lead Channel Sensing Intrinsic Amplitude: 12 mV
Lead Channel Sensing Intrinsic Amplitude: 3.1 mV
Lead Channel Setting Pacing Amplitude: 1.125
Lead Channel Setting Pacing Amplitude: 2 V
Lead Channel Setting Pacing Pulse Width: 0.4 ms
Lead Channel Setting Sensing Sensitivity: 4 mV
Pulse Gen Model: 2272
Pulse Gen Serial Number: 3134337

## 2018-07-29 ENCOUNTER — Telehealth: Payer: Self-pay | Admitting: *Deleted

## 2018-07-29 NOTE — Telephone Encounter (Signed)

## 2018-08-03 ENCOUNTER — Ambulatory Visit: Payer: BC Managed Care – PPO | Admitting: Cardiology

## 2018-08-03 ENCOUNTER — Other Ambulatory Visit: Payer: Self-pay

## 2018-08-03 ENCOUNTER — Encounter: Payer: Self-pay | Admitting: Cardiology

## 2018-08-03 VITALS — BP 126/72 | HR 69 | Ht 64.0 in

## 2018-08-03 DIAGNOSIS — I442 Atrioventricular block, complete: Secondary | ICD-10-CM | POA: Diagnosis not present

## 2018-08-03 DIAGNOSIS — I472 Ventricular tachycardia, unspecified: Secondary | ICD-10-CM

## 2018-08-03 LAB — CUP PACEART INCLINIC DEVICE CHECK
Battery Remaining Longevity: 126 mo
Battery Voltage: 2.96 V
Brady Statistic RA Percent Paced: 8.6 %
Brady Statistic RV Percent Paced: 99.37 %
Date Time Interrogation Session: 20200609143503
Implantable Lead Implant Date: 20170323
Implantable Lead Implant Date: 20170323
Implantable Lead Location: 753859
Implantable Lead Location: 753860
Implantable Pulse Generator Implant Date: 20170323
Lead Channel Impedance Value: 450 Ohm
Lead Channel Impedance Value: 575 Ohm
Lead Channel Pacing Threshold Amplitude: 0.625 V
Lead Channel Pacing Threshold Amplitude: 0.75 V
Lead Channel Pacing Threshold Pulse Width: 0.4 ms
Lead Channel Pacing Threshold Pulse Width: 0.4 ms
Lead Channel Sensing Intrinsic Amplitude: 12 mV
Lead Channel Sensing Intrinsic Amplitude: 3.6 mV
Lead Channel Setting Pacing Amplitude: 0.875
Lead Channel Setting Pacing Amplitude: 1.75 V
Lead Channel Setting Pacing Pulse Width: 0.4 ms
Lead Channel Setting Sensing Sensitivity: 4 mV
Pulse Gen Model: 2272
Pulse Gen Serial Number: 3134337

## 2018-08-03 NOTE — Progress Notes (Signed)
Electrophysiology Office Note   Date:  08/03/2018   ID:  Alejandra ShuKaren G Alverio, DOB 12/06/1960, MRN [id number]  PCP:  Junie SpencerHawks, Christy A, FNP  Primary Electrophysiologist:  Regan LemmingWill Martin Diontay Rosencrans, MD    No chief complaint on file.    History of Present Illness: Alejandra Harrison is a 58 y.o. female who presents today for electrophysiology evaluation.   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 She says that she has not been having any issues.  She presents today for annual follow up. Feeling well overall. She denies symptoms of palpitations, CP, SOB, orthopnea, PND, LE edema, claudication, dizziness, presyncope, syncope, bleeding, or neurologic sequela. [name] tolerating her medications without difficulties. She has had no further NSVT by pacer check. She has no major complaints today.   Past Medical History:  Diagnosis Date  . Anxiety   . AV block, complete (HCC)   . History of seasonal allergies   . Psychosis [facility](HCC)    Past Surgical History:  Procedure Laterality Date  . COLONOSCOPY N/A 09/28/2015   Procedure: COLONOSCOPY;  Surgeon: West BaliSandi L Fields, MD;  Location: [facility];  Service: Endoscopy;  Laterality: N/A;  1030  . EP IMPLANTABLE DEVICE N/A 05/17/2015   Procedure: Pacemaker Implant;  Surgeon: Ridhima Golberg Jorja LoaMartin Joshua Zeringue, MD;  Location: [facility] INVAS[facility] LAB;  Service: Cardiovascular;  Laterality: N/A;  . INSERT / REPLACE / REMOVE PACEMAKER  05/17/2015  . TUBAL LIGATION       Current Outpatient Medications  Medication Sig Dispense Refill  . ARIPiprazole (ABILIFY) 2 MG tablet Take 0.5 tablets (1 mg total) by mouth daily. 30 tablet 5  . metoprolol tartrate (LOPRESSOR) 50 MG tablet Take 1 tablet (50 mg total) by mouth 2 (two) times daily. 60 tablet 11   No current facility-administered medications for this visit.     Allergies:   Oxycodone-acetaminophen and Penicillins   Social History:  The patient  reports that she has never smoked. She has never  used smokeless tobacco. She reports that she does not drink alcohol or use drugs.   Family History:  The patient's family history includes Breast cancer in her paternal aunt; Dementia in her mother; Diabetes in her father.   Review of systems complete and found to be negative unless listed in HPI.    PHYSICAL EXAM: Physical Exam: Vitals:   08/03/18 1356  BP: 126/72  Pulse: 69  Height: 5\' 4"  (1.626 m)    GEN- The patient is well appearing, alert and oriented x 3 today.   Head- normocephalic, atraumatic Eyes-  Sclera clear, conjunctiva pink Ears- hearing intact Oropharynx- clear Neck- supple,  Lungs- Clear to ausculation bilaterally, normal work of breathing Chest- pacemaker pocket is without hematoma/ bruit Heart- Regular rate and rhythm, no murmurs, rubs or gallops, PMI not laterally displaced GI- soft, NT, ND, + BS Extremities- no clubbing, cyanosis, or edema Neuro- strength and sensation are intact  Pacemaker interrogation- reviewed in detail today,  See paper chart   EKG:  EKG is ordered today. Personal review of the EKG ordered shows A sensed, V paced (no pacing spikes visible but dependent by pacer check.)   Personal reviewed of the device interrogation today. Results in Paceart.   Recent Labs: No results found for requested labs within last 8760 hours.   Lipid Panel     Component Value Date/Time   CHOL 159 06/08/2017 1109   TRIG 92 06/08/2017 1109   HDL 50 06/08/2017  1109   CHOLHDL 3.2 06/08/2017 1109   LDLCALC 91 06/08/2017 1109   Wt Readings from Last 3 Encounters:  02/26/18 245 lb (111.1 kg)  02/18/18 249 lb 9.6 oz (113.2 kg)  08/03/17 241 lb 6.4 oz (109.5 kg)    TTE 07/16/16 - Left ventricle: The cavity size was normal. Wall thickness was   increased increased in a pattern of mild to moderate LVH.   Systolic function was normal. The estimated ejection fraction was   in the range of 60% to 65%. Doppler parameters are consistent   with abnormal left  ventricular relaxation (grade 1 diastolic   dysfunction). - Aortic valve: Mildly calcified annulus. Trileaflet; normal   thickness leaflets. Valve area (VTI): 2.55 cm^2. Valve area   (Vmax): 2.18 cm^2. Valve area (Vmean): 2.17 cm^2. - Technically adequate study.  ASSESSMENT AND PLAN:  1.  Complete heart block: Saint Jude dual-chamber pacemaker implanted 05/17/2015 is functioning appropriately. See Paceart report from today. A-Cap confirm turned on. No other changes.   2. Nonsustained ventricular tachycardia:  - No further episodes of ventricular tachycardia. Continue Metoprolol 50 mg BID.   Current med no medicines are reviewed at length with the patient today.   The patient does not have concerns regarding her medicines.  The following changes were made today: None  Labs/ tests ordered today include:  Orders Placed This Encounter  Procedures  . EKG 12-Lead   Disposition:   FU with Berneta Sconyers 12 months  Signed, Brittney Mucha Meredith Leeds, MD  08/03/2018 2:27 PM     [place] [place] [address] [phone] (office) [phone] (fax)  I have seen and examined this patient with Oda Kilts.  Agree with above, note added to reflect my findings.  On exam, RRR, no murmurs, lungs clear. Pacemaker functioning appropriately. No changes.    Daquan Crapps M. Parke Jandreau MD 08/03/2018 2:27 PM

## 2018-08-04 ENCOUNTER — Encounter: Payer: Self-pay | Admitting: Cardiology

## 2018-08-04 NOTE — Progress Notes (Signed)
Remote pacemaker transmission.   

## 2018-08-06 ENCOUNTER — Other Ambulatory Visit: Payer: Self-pay | Admitting: Cardiology

## 2018-08-11 ENCOUNTER — Other Ambulatory Visit: Payer: Self-pay

## 2018-08-12 ENCOUNTER — Ambulatory Visit (INDEPENDENT_AMBULATORY_CARE_PROVIDER_SITE_OTHER): Payer: BC Managed Care – PPO | Admitting: Family

## 2018-08-12 ENCOUNTER — Encounter: Payer: Self-pay | Admitting: Family

## 2018-08-12 VITALS — BP 141/91 | HR 71 | Temp 97.7°F | Ht 64.0 in | Wt 248.6 lb

## 2018-08-12 DIAGNOSIS — E538 Deficiency of other specified B group vitamins: Secondary | ICD-10-CM

## 2018-08-12 DIAGNOSIS — Z0001 Encounter for general adult medical examination with abnormal findings: Secondary | ICD-10-CM

## 2018-08-12 DIAGNOSIS — K219 Gastro-esophageal reflux disease without esophagitis: Secondary | ICD-10-CM

## 2018-08-12 DIAGNOSIS — I442 Atrioventricular block, complete: Secondary | ICD-10-CM

## 2018-08-12 DIAGNOSIS — K5900 Constipation, unspecified: Secondary | ICD-10-CM

## 2018-08-12 DIAGNOSIS — I459 Conduction disorder, unspecified: Secondary | ICD-10-CM

## 2018-08-12 DIAGNOSIS — Z01411 Encounter for gynecological examination (general) (routine) with abnormal findings: Secondary | ICD-10-CM | POA: Diagnosis not present

## 2018-08-12 DIAGNOSIS — N952 Postmenopausal atrophic vaginitis: Secondary | ICD-10-CM | POA: Insufficient documentation

## 2018-08-12 DIAGNOSIS — Z0000 Encounter for general adult medical examination without abnormal findings: Secondary | ICD-10-CM

## 2018-08-12 DIAGNOSIS — Z01419 Encounter for gynecological examination (general) (routine) without abnormal findings: Secondary | ICD-10-CM

## 2018-08-12 DIAGNOSIS — E559 Vitamin D deficiency, unspecified: Secondary | ICD-10-CM

## 2018-08-12 LAB — MICROSCOPIC EXAMINATION

## 2018-08-12 LAB — URINALYSIS, COMPLETE
Bilirubin, UA: NEGATIVE
Glucose, UA: NEGATIVE
Ketones, UA: NEGATIVE
Nitrite, UA: NEGATIVE
Protein,UA: NEGATIVE
RBC, UA: NEGATIVE
Specific Gravity, UA: 1.01 (ref 1.005–1.030)
Urobilinogen, Ur: 0.2 mg/dL (ref 0.2–1.0)
pH, UA: 6 (ref 5.0–7.5)

## 2018-08-12 NOTE — Progress Notes (Signed)
Subjective:    Patient ID: Alejandra Harrison, female    DOB: 07-Feb-1961, 58 y.o.   MRN: [id number]  Chief Complaint  Patient presents with  . Gynecologic Exam    wants blood work and pap   Pt presents to the office today for CPE with pap. [name] followed by Cardiologists annually for heart block and has a pacemaker.  Gynecologic Exam The patient's pertinent negatives include no genital lesions, vaginal bleeding or vaginal discharge. The patient is experiencing no pain. Associated symptoms include constipation.  Hypertension This is a chronic problem. The current episode started more than 1 year ago. The problem has been resolved since onset. The problem is controlled. Pertinent negatives include no peripheral edema or shortness of breath. Risk factors for coronary artery disease include family history, obesity and sedentary lifestyle. The current treatment provides moderate improvement.  Constipation This is a chronic problem. The current episode started more than 1 year ago. The problem has been waxing and waning since onset. Her stool frequency is 1 time per day. She has tried diet changes for the symptoms. The treatment provided moderate relief.  Gastroesophageal Reflux She complains of heartburn. She reports no belching. This is a chronic problem. The current episode started more than 1 year ago. The problem occurs occasionally. She has tried an antacid for the symptoms. The treatment provided moderate relief.      Review of Systems  Respiratory: Negative for shortness of breath.   Gastrointestinal: Positive for constipation and heartburn.  Genitourinary: Negative for vaginal discharge.  All other systems reviewed and are negative.   Family History  Problem Relation Age of Onset  . Dementia Mother   . Diabetes Father   . Breast cancer Paternal Aunt   . ADD / ADHD Neg Hx   . Alcohol abuse Neg Hx   . Drug abuse Neg Hx   . Anxiety disorder Neg Hx   . Bipolar disorder Neg Hx   .  Depression Neg Hx   . OCD Neg Hx   . Paranoid behavior Neg Hx   . Schizophrenia Neg Hx   . Seizures Neg Hx   . Sexual abuse Neg Hx   . Physical abuse Neg Hx   . Colon cancer Neg Hx     Social History   Socioeconomic History  . Marital status: Married    Spouse name: Not on file  . Number of children: Not on file  . Years of education: Not on file  . Highest education level: Not on file  Occupational History  . Not on file  Social Needs  . Financial resource strain: Not on file  . Food insecurity    Worry: Not on file    Inability: Not on file  . Transportation needs    Medical: Not on file    Non-medical: Not on file  Tobacco Use  . Smoking status: Never Smoker  . Smokeless tobacco: Never Used  . Tobacco comment: Never smoked  Substance and Sexual Activity  . Alcohol use: No    Alcohol/week: 0.0 standard drinks  . Drug use: No  . Sexual activity: Not Currently    Birth control/protection: Post-menopausal  Lifestyle  . Physical activity    Days per week: Not on file    Minutes per session: Not on file  . Stress: Not on file  Relationships  . Social Herbalist on phone: Not on file    Gets together: Not on file  Attends religious service: Not on file    Active member of club or organization: Not on file    Attends meetings of clubs or organizations: Not on file    Relationship status: Not on file  . Intimate partner violence    Fear of current or ex partner: Not on file    Emotionally abused: Not on file    Physically abused: Not on file    Forced sexual activity: Not on file  Other Topics Concern  . Not on file  Social History Narrative  . Not on file       Objective:   Physical Exam Vitals signs reviewed.  Constitutional:      General: [name] not in acute distress.    Appearance: [name] well-developed.  HENT:     Head: Normocephalic and atraumatic.     Right Ear: Tympanic membrane normal.     Left Ear: Tympanic membrane normal.   Eyes:     Pupils: Pupils are equal, round, and reactive to light.  Neck:     Musculoskeletal: Normal range of motion and neck supple.     Thyroid: No thyromegaly.  Cardiovascular:     Rate and Rhythm: Normal rate and regular rhythm.     Heart sounds: Normal heart sounds. No murmur.  Pulmonary:     Effort: Pulmonary effort is normal. No respiratory distress.     Breath sounds: Normal breath sounds. No wheezing.  Chest:     Breasts:        Right: No swelling, bleeding, inverted nipple, mass, nipple discharge, skin change or tenderness.        Left: No swelling, bleeding, inverted nipple, mass, nipple discharge, skin change or tenderness.  Abdominal:     General: Bowel sounds are normal. There is no distension.     Palpations: Abdomen is soft.     Tenderness: There is no abdominal tenderness.  Genitourinary:    General: Normal vulva.     Comments: Bimanual exam- no adnexal masses or tenderness, ovaries nonpalpable   Cervix parous and pink- No discharge, tissue atrophic   Musculoskeletal: Normal range of motion.        General: No tenderness.  Skin:    General: Skin is warm and dry.  Neurological:     Mental Status: [name] alert and oriented to person, place, and time.     Cranial Nerves: No cranial nerve deficit.     Deep Tendon Reflexes: Reflexes are normal and symmetric.  Psychiatric:        Behavior: Behavior normal.        Thought Content: Thought content normal.        Judgment: Judgment normal.       BP (!) 141/91   Pulse 71   Temp 97.7 F (36.5 C) (Oral)   Ht _0  (1.626 m)   Wt 248 lb 9.6 oz (112.8 kg)   BMI 42.67 kg/m      Assessment & Plan:  Alejandra Harrison comes in today with chief complaint of Gynecologic Exam (wants blood work and pap)   Diagnosis and orders addressed:  1. Gynecologic exam normal - Urinalysis, Complete - CMP14+EGFR - CBC with Differential/Platelet - IGP, Aptima HPV, rfx 16/18,45  2. Annual physical exam - CMP14+EGFR - CBC  with Differential/Platelet - Lipid panel - TSH - IGP, Aptima HPV, rfx 16/18,45  3. Morbid obesity ([place]) - CMP14+EGFR - CBC with Differential/Platelet  4. Gastroesophageal reflux disease, esophagitis presence not specified -  CMP14+EGFR - CBC with Differential/Platelet  5. Atrophic vaginitis  6. Heart block  7. CHB (complete heart block) (HCC)  8. Vitamin D deficiency - VITAMIN D 25 Hydroxy (Vit-D Deficiency, Fractures)  9. Vitamin B 12 deficiency - Vitamin B12  10. Constipation, unspecified constipation type   Labs pending Health Maintenance reviewed Diet and exercise encouraged  Follow up plan: 1 year   Evelina Dun, FNP

## 2018-08-12 NOTE — Patient Instructions (Signed)

## 2018-08-13 ENCOUNTER — Telehealth: Payer: Self-pay | Admitting: Family

## 2018-08-13 LAB — CMP14+EGFR
ALT: 39 IU/L — ABNORMAL HIGH (ref 0–32)
AST: 28 IU/L (ref 0–40)
Albumin/Globulin Ratio: 1.8 (ref 1.2–2.2)
Albumin: 4.5 g/dL (ref 3.8–4.9)
Alkaline Phosphatase: 78 IU/L (ref 39–117)
BUN/Creatinine Ratio: 13 (ref 9–23)
BUN: 9 mg/dL (ref 6–24)
Bilirubin Total: 0.5 mg/dL (ref 0.0–1.2)
CO2: 19 mmol/L — ABNORMAL LOW (ref 20–29)
Calcium: 9.4 mg/dL (ref 8.7–10.2)
Chloride: 102 mmol/L (ref 96–106)
Creatinine, Ser: 0.72 mg/dL (ref 0.57–1.00)
GFR calc Af Amer: 106 mL/min/{1.73_m2} (ref >59)
GFR calc non Af Amer: 92 mL/min/{1.73_m2} (ref >59)
Globulin, Total: 2.5 g/dL (ref 1.5–4.5)
Glucose: 79 mg/dL (ref 65–99)
Potassium: 4.3 mmol/L (ref 3.5–5.2)
Sodium: 139 mmol/L (ref 134–144)
Total Protein: 7 g/dL (ref 6.0–8.5)

## 2018-08-13 LAB — VITAMIN B12: Vitamin B-12: 396 pg/mL (ref 232–1245)

## 2018-08-13 LAB — VITAMIN D 25 HYDROXY (VIT D DEFICIENCY, FRACTURES): Vit D, 25-Hydroxy: 23.3 ng/mL — ABNORMAL LOW (ref 30.0–100.0)

## 2018-08-13 LAB — CBC WITH DIFFERENTIAL/PLATELET
Basophils Absolute: 0.1 10*3/uL (ref 0.0–0.2)
Basos: 1 %
EOS (ABSOLUTE): 0.2 10*3/uL (ref 0.0–0.4)
Eos: 2 %
Hematocrit: 43.4 % (ref 34.0–46.6)
Hemoglobin: 14.5 g/dL (ref 11.1–15.9)
Immature Grans (Abs): 0 10*3/uL (ref 0.0–0.1)
Immature Granulocytes: 0 %
Lymphocytes Absolute: 2.3 10*3/uL (ref 0.7–3.1)
Lymphs: 25 %
MCH: 29.1 pg (ref 26.6–33.0)
MCHC: 33.4 g/dL (ref 31.5–35.7)
MCV: 87 fL (ref 79–97)
Monocytes Absolute: 0.6 10*3/uL (ref 0.1–0.9)
Monocytes: 6 %
Neutrophils Absolute: 6.1 10*3/uL (ref 1.4–7.0)
Neutrophils: 66 %
Platelets: 318 10*3/uL (ref 150–450)
RBC: 4.98 x10E6/uL (ref 3.77–5.28)
RDW: 14 % (ref 11.7–15.4)
WBC: 9.2 10*3/uL (ref 3.4–10.8)

## 2018-08-13 LAB — LIPID PANEL
Chol/HDL Ratio: 3.5 ratio (ref 0.0–4.4)
Cholesterol, Total: 170 mg/dL (ref 100–199)
HDL: 49 mg/dL (ref >39)
LDL Calculated: 100 mg/dL — ABNORMAL HIGH (ref 0–99)
Triglycerides: 104 mg/dL (ref 0–149)
VLDL Cholesterol Cal: 21 mg/dL (ref 5–40)

## 2018-08-13 LAB — TSH: TSH: 2.27 u[IU]/mL (ref 0.450–4.500)

## 2018-08-13 NOTE — Telephone Encounter (Signed)
Aware. 

## 2018-08-14 ENCOUNTER — Other Ambulatory Visit: Payer: Self-pay | Admitting: Family

## 2018-08-18 LAB — IGP, APTIMA HPV, RFX 16/18,45: HPV Aptima: NEGATIVE

## 2018-08-30 ENCOUNTER — Encounter (HOSPITAL_COMMUNITY): Payer: Self-pay | Admitting: Psychiatry

## 2018-08-30 ENCOUNTER — Ambulatory Visit (INDEPENDENT_AMBULATORY_CARE_PROVIDER_SITE_OTHER): Payer: BC Managed Care – PPO | Admitting: Psychiatry

## 2018-08-30 ENCOUNTER — Other Ambulatory Visit: Payer: Self-pay

## 2018-08-30 DIAGNOSIS — F23 Brief psychotic disorder: Secondary | ICD-10-CM

## 2018-08-30 MED ORDER — ARIPIPRAZOLE 2 MG PO TABS: 1.0000 mg | ORAL_TABLET | Freq: Every day | ORAL | 5 refills | Status: DC

## 2018-08-30 NOTE — Progress Notes (Signed)
Virtual Visit via Telephone Note  I connected with Alejandra Harrison on 08/30/18 at  9:20 AM EDT by telephone and verified that I am speaking with the correct person using two identifiers.   I discussed the limitations, risks, security and privacy concerns of performing an evaluation and management service by telephone and the availability of in person appointments. I also discussed with the patient that there may be a patient responsible charge related to this service. The patient expressed understanding and agreed to proceed.     I discussed the assessment and treatment plan with the patient. The patient was provided an opportunity to ask questions and all were answered. The patient agreed with the plan and demonstrated an understanding of the instructions.   The patient was advised to call back or seek an in-person evaluation if the symptoms worsen or if the condition fails to improve as anticipated.  I provided 15 minutes of non-face-to-face time during this encounter.   Diannia Rudereborah Cheryllynn Sarff, MD  [facility] MD/PA/NP OP Progress Note  08/30/2018 9:31 AM Alejandra Harrison  MRN:  [id number]  Chief Complaint:  Chief Complaint    Hallucinations; Follow-up     HPI: This patient is a 58 year old married white female who lives with her husband and 2 sonsin [place]. 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  The patient states that in 2012 she started seeing a figure that she thought may have been God. She also got fixated on a man in her Sunday school and thought that her husband would die and she would have to marry this man. She was having lots of religious visions. She claims that she had not been on any medication or using drugs or alcohol at that time. She had not had a head injury or any additional stress. She may of been going through menopause. She denied visual hallucinations but claims she felt "high and on top of the world." She's never had a history of depression however.  At any  rate she was seen here by Dr. Toni ArthursFuller and started on Abilify. She was on a higher dose such as 5 mg in the past and it caused akathisia. Currently she takes 1 mg per day and it seems to be working fairly well. She's no longer having visions or unusual thoughts and she's functioning well in her job. She's not having significant side effects  The patient returns for follow-up after 6 months.  [name] assessed via phone due to the coronavirus pandemic.  She states that overall she is doing well.  She stopped working in March when the schools close.  [name] not sure when or if she will be going back.  [name] enjoying time home with her husband and sons and doing some work in the yard.  She recently had a physical and also visit with cardiology and results were good from both.  She has low vitamin D but all her other labs look good.  She states that she is feeling well.  She denies any delusions paranoia or hallucinations or depression.  She does not have any akathisia or other side effects from medications Visit Diagnosis:    ICD-10-CM   1. Brief psychotic disorder (HCC)  F23     Past Psychiatric History: none  Past Medical History:  Past Medical History:  Diagnosis Date  . Anxiety   . AV block, complete (HCC)   . History of seasonal allergies   . Psychosis (HCC)  Past Surgical History:  Procedure Laterality Date  . COLONOSCOPY N/A 09/28/2015   Procedure: COLONOSCOPY;  Surgeon: West BaliSandi L Fields, MD;  [facility];  Service: Endoscopy;  Laterality: N/A;  1030  . EP IMPLANTABLE DEVICE N/A 05/17/2015   Procedure: Pacemaker Implant;  Surgeon: Will Jorja LoaMartin Camnitz, MD;  [facility];  Service: Cardiovascular;  Laterality: N/A;  . INSERT / REPLACE / REMOVE PACEMAKER  05/17/2015  . TUBAL LIGATION      Family Psychiatric History: none  Family History:  Family History  Problem Relation Age of Onset  . Dementia Mother   . Diabetes Father   . Breast cancer Paternal Aunt   .  ADD / ADHD Neg Hx   . Alcohol abuse Neg Hx   . Drug abuse Neg Hx   . Anxiety disorder Neg Hx   . Bipolar disorder Neg Hx   . Depression Neg Hx   . OCD Neg Hx   . Paranoid behavior Neg Hx   . Schizophrenia Neg Hx   . Seizures Neg Hx   . Sexual abuse Neg Hx   . Physical abuse Neg Hx   . Colon cancer Neg Hx     Social History:  Social History   Socioeconomic History  . Marital status: Married    Spouse name: Not on file  . Number of children: Not on file  . Years of education: Not on file  . Highest education level: Not on file  Occupational History  . Not on file  Social Needs  . Financial resource strain: Not on file  . Food insecurity    Worry: Not on file    Inability: Not on file  . Transportation needs    Medical: Not on file    Non-medical: Not on file  Tobacco Use  . Smoking status: Never Smoker  . Smokeless tobacco: Never Used  . Tobacco comment: Never smoked  Substance and Sexual Activity  . Alcohol use: No    Alcohol/week: 0.0 standard drinks  . Drug use: No  . Sexual activity: Not Currently    Birth control/protection: Post-menopausal  Lifestyle  . Physical activity    Days per week: Not on file    Minutes per session: Not on file  . Stress: Not on file  Relationships  . Social Musicianconnections    Talks on phone: Not on file    Gets together: Not on file    Attends religious service: Not on file    Active member of club or organization: Not on file    Attends meetings of clubs or organizations: Not on file    Relationship status: Not on file  Other Topics Concern  . Not on file  Social History Narrative  . Not on file    Allergies:  Allergies  Allergen Reactions  . Oxycodone-Acetaminophen Hives and Other (See Comments)    Hands peeled  . Penicillins Hives and Other (See Comments)    Hands peeled    Metabolic Disorder Labs: No results found for: HGBA1C, MPG No results found for: PROLACTIN Lab Results  Component Value Date   CHOL 170  08/12/2018   TRIG 104 08/12/2018   HDL 49 08/12/2018   CHOLHDL 3.5 08/12/2018   LDLCALC 100 (H) 08/12/2018   LDLCALC 91 06/08/2017   Lab Results  Component Value Date   TSH 2.270 08/12/2018   TSH 3.290 06/08/2017    Therapeutic Level Labs: No results found for: LITHIUM No results found for:  VALPROATE No components found for:  CBMZ  Current Medications: Current Outpatient Medications  Medication Sig Dispense Refill  . ARIPiprazole (ABILIFY) 2 MG tablet Take 0.5 tablets (1 mg total) by mouth daily. 30 tablet 5  . metoprolol tartrate (LOPRESSOR) 50 MG tablet Take 1 tablet by mouth twice daily 180 tablet 3  . Vitamin D, Ergocalciferol, (DRISDOL) 1.25 MG (50000 UT) CAPS capsule Take 1 capsule by mouth once a week 12 capsule 0   No current facility-administered medications for this visit.      Musculoskeletal: Strength & Muscle Tone: within normal limits Gait & Station: normal Patient leans: N/A  Psychiatric Specialty Exam: Review of Systems  All other systems reviewed and are negative.   There were no vitals taken for this visit.There is no height or weight on file to calculate BMI.  General Appearance: NA  Eye Contact:  NA  Speech:  Clear and Coherent  Volume:  Normal  Mood:  Euthymic  Affect:  NA  Thought Process:  Goal Directed  Orientation:  Full (Time, Place, and Person)  Thought Content: Logical   Suicidal Thoughts: no  Homicidal Thoughts:  No  Memory:  Immediate;   Good Recent;   Good Remote;   Fair  Judgement:  Good  Insight:  Fair  Psychomotor Activity:  Normal  Concentration:  Concentration: Good and Attention Span: Good  Recall:  Good  Fund of Knowledge: Fair  Language: Good  Akathisia:  No  Handed:  Right  AIMS (if indicated): not done  Assets:  Communication Skills Desire for Improvement Resilience Social Support Talents/Skills  ADL's:  Intact  Cognition: WNL  Sleep:  Good   Screenings: PHQ2-9     Office Visit from 08/12/2018 in [place] Office Visit from 02/26/2018 in [place] Visit from 02/18/2018 in [place] Office Visit from 06/08/2017 in [place] Visit from 03/26/2017 in [place]  PHQ-2 Total Score  0  0  0  0  0       Assessment and Plan: This patient is a 58 year old female who had a psychotic episode several years ago.  She has been maintained on Abilify 1 mg daily without side effect.  We will continue this medication and she will return to see me in 6 months   Levonne Spiller, MD 08/30/2018, 9:31 AM

## 2018-10-26 ENCOUNTER — Ambulatory Visit (INDEPENDENT_AMBULATORY_CARE_PROVIDER_SITE_OTHER): Payer: BC Managed Care – PPO | Admitting: *Deleted

## 2018-10-26 DIAGNOSIS — I442 Atrioventricular block, complete: Secondary | ICD-10-CM

## 2018-10-26 LAB — CUP PACEART REMOTE DEVICE CHECK
Battery Remaining Longevity: 123 mo
Battery Remaining Percentage: 95.5 %
Battery Voltage: 2.96 V
Brady Statistic AP VP Percent: 7.8 %
Brady Statistic AP VS Percent: 1 %
Brady Statistic AS VP Percent: 92 %
Brady Statistic AS VS Percent: 1 %
Brady Statistic RA Percent Paced: 7.5 %
Brady Statistic RV Percent Paced: 99 %
Date Time Interrogation Session: 20200901085545
Implantable Lead Implant Date: 20170323
Implantable Lead Implant Date: 20170323
Implantable Lead Location: 753859
Implantable Lead Location: 753860
Implantable Pulse Generator Implant Date: 20170323
Lead Channel Impedance Value: 440 Ohm
Lead Channel Impedance Value: 610 Ohm
Lead Channel Pacing Threshold Amplitude: 0.75 V
Lead Channel Pacing Threshold Amplitude: 0.75 V
Lead Channel Pacing Threshold Pulse Width: 0.4 ms
Lead Channel Pacing Threshold Pulse Width: 0.4 ms
Lead Channel Sensing Intrinsic Amplitude: 12 mV
Lead Channel Sensing Intrinsic Amplitude: 2.6 mV
Lead Channel Setting Pacing Amplitude: 1 V
Lead Channel Setting Pacing Amplitude: 1.75 V
Lead Channel Setting Pacing Pulse Width: 0.4 ms
Lead Channel Setting Sensing Sensitivity: 4 mV
Pulse Gen Model: 2272
Pulse Gen Serial Number: 3134337

## 2018-11-02 ENCOUNTER — Other Ambulatory Visit: Payer: Self-pay | Admitting: Family

## 2018-11-09 NOTE — Progress Notes (Signed)
Remote pacemaker transmission.   

## 2018-11-16 ENCOUNTER — Other Ambulatory Visit: Payer: Self-pay

## 2018-11-17 ENCOUNTER — Ambulatory Visit (INDEPENDENT_AMBULATORY_CARE_PROVIDER_SITE_OTHER): Payer: BC Managed Care – PPO

## 2018-11-17 ENCOUNTER — Other Ambulatory Visit: Payer: Self-pay

## 2018-11-17 DIAGNOSIS — Z23 Encounter for immunization: Secondary | ICD-10-CM

## 2019-01-22 ENCOUNTER — Other Ambulatory Visit: Payer: Self-pay | Admitting: Family

## 2019-01-25 ENCOUNTER — Ambulatory Visit (INDEPENDENT_AMBULATORY_CARE_PROVIDER_SITE_OTHER): Payer: BC Managed Care – PPO | Admitting: *Deleted

## 2019-01-25 DIAGNOSIS — I442 Atrioventricular block, complete: Secondary | ICD-10-CM | POA: Diagnosis not present

## 2019-01-25 LAB — CUP PACEART REMOTE DEVICE CHECK
Battery Remaining Longevity: 124 mo
Battery Remaining Percentage: 95.5 %
Battery Voltage: 2.96 V
Brady Statistic AP VP Percent: 7.6 %
Brady Statistic AP VS Percent: 1 %
Brady Statistic AS VP Percent: 92 %
Brady Statistic AS VS Percent: 1 %
Brady Statistic RA Percent Paced: 7.3 %
Brady Statistic RV Percent Paced: 99 %
Date Time Interrogation Session: 20201201050455
Implantable Lead Implant Date: 20170323
Implantable Lead Implant Date: 20170323
Implantable Lead Location: 753859
Implantable Lead Location: 753860
Implantable Pulse Generator Implant Date: 20170323
Lead Channel Impedance Value: 450 Ohm
Lead Channel Impedance Value: 600 Ohm
Lead Channel Pacing Threshold Amplitude: 0.75 V
Lead Channel Pacing Threshold Amplitude: 0.875 V
Lead Channel Pacing Threshold Pulse Width: 0.4 ms
Lead Channel Pacing Threshold Pulse Width: 0.4 ms
Lead Channel Sensing Intrinsic Amplitude: 12 mV
Lead Channel Sensing Intrinsic Amplitude: 3.2 mV
Lead Channel Setting Pacing Amplitude: 1.125
Lead Channel Setting Pacing Amplitude: 1.75 V
Lead Channel Setting Pacing Pulse Width: 0.4 ms
Lead Channel Setting Sensing Sensitivity: 4 mV
Pulse Gen Model: 2272
Pulse Gen Serial Number: 3134337

## 2019-02-19 NOTE — Progress Notes (Signed)
PPM remote 

## 2019-03-02 ENCOUNTER — Ambulatory Visit (INDEPENDENT_AMBULATORY_CARE_PROVIDER_SITE_OTHER): Payer: BC Managed Care – PPO | Admitting: Psychiatry

## 2019-03-02 ENCOUNTER — Other Ambulatory Visit: Payer: Self-pay

## 2019-03-02 ENCOUNTER — Encounter (HOSPITAL_COMMUNITY): Payer: Self-pay | Admitting: Psychiatry

## 2019-03-02 DIAGNOSIS — F23 Brief psychotic disorder: Secondary | ICD-10-CM

## 2019-03-02 MED ORDER — ARIPIPRAZOLE 2 MG PO TABS: 1.0000 mg | ORAL_TABLET | Freq: Every day | ORAL | 5 refills | Status: DC

## 2019-03-02 NOTE — Progress Notes (Signed)
Virtual Visit via Telephone Note  I connected with Alejandra Harrison on 03/02/19 at  8:40 AM EST by telephone and verified that I am speaking with the correct person using two identifiers.   I discussed the limitations, risks, security and privacy concerns of performing an evaluation and management service by telephone and the availability of in person appointments. I also discussed with the patient that there may be a patient responsible charge related to this service. The patient expressed understanding and agreed to proceed.    I discussed the assessment and treatment plan with the patient. The patient was provided an opportunity to ask questions and all were answered. The patient agreed with the plan and demonstrated an understanding of the instructions.   The patient was advised to call back or seek an in-person evaluation if the symptoms worsen or if the condition fails to improve as anticipated.  I provided 15 minutes of non-face-to-face time during this encounter.   [name], MD  [facility] MD/PA/NP OP Progress Note  03/02/2019 8:54 AM Alejandra Harrison  MRN:  [id number]  Chief Complaint:  Chief Complaint    Hallucinations; Follow-up     HPI: Thispatient is a 59 year old married white female who lives with her husband and 2 sonsin [place]. 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  The patient states that in 2012 she started seeing a figure that she thought may have been God. She also got fixated on a man in her Sunday school and thought that her husband would die and she would have to marry this man. She was having lots of religious visions. She claims that she had not been on any medication or using drugs or alcohol at that time. She had not had a head injury or any additional stress. She may of been going through menopause. She denied visual hallucinations but claims she felt "high and on top of the world." She's never had a history of depression however.  At any  rate she was seen here by Dr. Toni Arthurs and started on Abilify. She was on a higher dose such as 5 mg in the past and it caused akathisia. Currently she takes 1 mg per day and it seems to be working fairly well. She's no longer having visions or unusual thoughts and she's functioning well in her job. She's not having significant side effects  Patient returns for follow-up after 6 months.  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  She states that she is doing very well and wearing her mask whenever she goes out.  Her mood has been good and she denies any delusions hallucinations or changes in mood or depression.  She continues to take the Abilify without side effects such as twitching or jerking.  [name] sleeping well. Visit Diagnosis:    ICD-10-CM   1. Brief psychotic disorder (HCC)  F23     Past Psychiatric History: none  Past Medical History:  Past Medical History:  Diagnosis Date  . Anxiety   . AV block, complete (HCC)   . History of seasonal allergies   . Psychosis [facility])     Past Surgical History:  Procedure Laterality Date  . COLONOSCOPY N/A 09/28/2015   Procedure: COLONOSCOPY;  Surgeon: West Bali, MD;  [facility];  Service: Endoscopy;  Laterality: N/A;  1030  . EP IMPLANTABLE DEVICE N/A 05/17/2015   Procedure: Pacemaker Implant;  Surgeon: Will Jorja Loa, MD;  Location: [facility] INVASIVE CV LAB;  Service: Cardiovascular;  Laterality: N/A;  . INSERT / REPLACE / REMOVE PACEMAKER  05/17/2015  . TUBAL LIGATION      Family Psychiatric History: see below  Family History:  Family History  Problem Relation Age of Onset  . Dementia Mother   . Diabetes Father   . Breast cancer Paternal Aunt   . ADD / ADHD Neg Hx   . Alcohol abuse Neg Hx   . Drug abuse Neg Hx   . Anxiety disorder Neg Hx   .  Bipolar disorder Neg Hx   . Depression Neg Hx   . OCD Neg Hx   . Paranoid behavior Neg Hx   . Schizophrenia Neg Hx   . Seizures Neg Hx   . Sexual abuse Neg Hx   . Physical abuse Neg Hx   . Colon cancer Neg Hx     Social History:  Social History   Socioeconomic History  . Marital status: Married    Spouse name: Not on file  . Number of children: Not on file  . Years of education: Not on file  . Highest education level: Not on file  Occupational History  . Not on file  Tobacco Use  . Smoking status: Never Smoker  . Smokeless tobacco: Never Used  . Tobacco comment: Never smoked  Substance and Sexual Activity  . Alcohol use: No    Alcohol/week: 0.0 standard drinks  . Drug use: No  . Sexual activity: Not Currently    Birth control/protection: Post-menopausal  Other Topics Concern  . Not on file  Social History Narrative  . Not on file   Social Determinants of Health   Financial Resource Strain:   . Difficulty of Paying Living Expenses: Not on file  Food Insecurity:   . Worried About Programme researcher, broadcasting/film/video in the Last Year: Not on file  . Ran Out of Food in the Last Year: Not on file  Transportation Needs:   . Lack of Transportation (Medical): Not on file  . Lack of Transportation (Non-Medical): Not on file  Physical Activity:   . Days of Exercise per Week: Not on file  . Minutes of Exercise per Session: Not on file  Stress:   . Feeling of Stress : Not on file  Social Connections:   . Frequency of Communication with Friends and Family: Not on file  . Frequency of Social Gatherings with Friends and Family: Not on file  . Attends Religious Services: Not on file  . Active Member of Clubs or Organizations: Not on file  . Attends Banker Meetings: Not on file  . Marital Status: Not on file    Allergies:  Allergies  Allergen Reactions  . Oxycodone-Acetaminophen Hives and Other (See Comments)    Hands peeled  . Penicillins Hives and Other (See Comments)     Hands peeled    Metabolic Disorder Labs: No results found for: HGBA1C, MPG No results found for: PROLACTIN Lab Results  Component Value Date   CHOL 170 08/12/2018   TRIG 104 08/12/2018   HDL 49 08/12/2018   CHOLHDL 3.5 08/12/2018   LDLCALC 100 (H) 08/12/2018   LDLCALC 91 06/08/2017   Lab Results  Component Value Date   TSH 2.270 08/12/2018   TSH 3.290 06/08/2017  Therapeutic Level Labs: No results found for: LITHIUM No results found for: VALPROATE No components found for:  CBMZ  Current Medications: Current Outpatient Medications  Medication Sig Dispense Refill  . ARIPiprazole (ABILIFY) 2 MG tablet Take 0.5 tablets (1 mg total) by mouth daily. 30 tablet 5  . metoprolol tartrate (LOPRESSOR) 50 MG tablet Take 1 tablet by mouth twice daily 180 tablet 3  . Vitamin D, Ergocalciferol, (DRISDOL) 1.25 MG (50000 UT) CAPS capsule Take 1 capsule by mouth once a week 12 capsule 0   No current facility-administered medications for this visit.     Musculoskeletal: Strength & Muscle Tone: within normal limits Gait & Station: normal Patient leans: N/A  Psychiatric Specialty Exam: Review of Systems  All other systems reviewed and are negative.   There were no vitals taken for this visit.There is no height or weight on file to calculate BMI.  General Appearance: NA  Eye Contact:  NA  Speech:  Clear and Coherent  Volume:  Normal  Mood:  Euthymic  Affect:  NA  Thought Process:  Goal Directed  Orientation:  Full (Time, Place, and Person)  Thought Content: WDL   Suicidal Thoughts:  No  Homicidal Thoughts:  No  Memory:  Immediate;   Good Recent;   Good Remote;   Good  Judgement:  Good  Insight:  Fair  Psychomotor Activity:  Normal  Concentration:  Concentration: Good and Attention Span: Good  Recall:  Good  Fund of Knowledge: Good  Language: Good  Akathisia:  No  Handed:  Right  AIMS (if indicated): not done  Assets:  Communication Skills Desire for  Improvement Resilience Social Support Talents/Skills  ADL's:  Intact  Cognition: WNL  Sleep:  Good   Screenings: PHQ2-9     Office Visit from 08/12/2018 in [place] Office Visit from 02/26/2018 in [place] Office Visit from 02/18/2018 in [place] Office Visit from 06/08/2017 in [place] Visit from 03/26/2017 in [place]  PHQ-2 Total Score  0  0  0  0  0       Assessment and Plan: This patient is a 59 year old female who has had a psychotic episode several years back.  She has been maintained on Abilify 1 mg daily without side effect or further psychotic symptoms.  She will continue this dosage and return to see me in 6 months   Levonne Spiller, MD 03/02/2019, 8:54 AM

## 2019-04-18 ENCOUNTER — Other Ambulatory Visit: Payer: Self-pay | Admitting: Family

## 2019-04-26 ENCOUNTER — Ambulatory Visit (INDEPENDENT_AMBULATORY_CARE_PROVIDER_SITE_OTHER): Payer: BC Managed Care – PPO | Admitting: *Deleted

## 2019-04-26 DIAGNOSIS — I442 Atrioventricular block, complete: Secondary | ICD-10-CM

## 2019-04-27 LAB — CUP PACEART REMOTE DEVICE CHECK
Battery Remaining Longevity: 125 mo
Battery Remaining Percentage: 95.5 %
Battery Voltage: 2.96 V
Brady Statistic AP VP Percent: 6.6 %
Brady Statistic AP VS Percent: 1 %
Brady Statistic AS VP Percent: 93 %
Brady Statistic AS VS Percent: 1 %
Brady Statistic RA Percent Paced: 6.4 %
Brady Statistic RV Percent Paced: 99 %
Date Time Interrogation Session: 20210302160629
Implantable Lead Implant Date: 20170323
Implantable Lead Implant Date: 20170323
Implantable Lead Location: 753859
Implantable Lead Location: 753860
Implantable Pulse Generator Implant Date: 20170323
Lead Channel Impedance Value: 450 Ohm
Lead Channel Impedance Value: 600 Ohm
Lead Channel Pacing Threshold Amplitude: 0.625 V
Lead Channel Pacing Threshold Amplitude: 0.625 V
Lead Channel Pacing Threshold Pulse Width: 0.4 ms
Lead Channel Pacing Threshold Pulse Width: 0.4 ms
Lead Channel Sensing Intrinsic Amplitude: 12 mV
Lead Channel Sensing Intrinsic Amplitude: 2.8 mV
Lead Channel Setting Pacing Amplitude: 0.875
Lead Channel Setting Pacing Amplitude: 1.625
Lead Channel Setting Pacing Pulse Width: 0.4 ms
Lead Channel Setting Sensing Sensitivity: 4 mV
Pulse Gen Model: 2272
Pulse Gen Serial Number: 3134337

## 2019-04-27 NOTE — Progress Notes (Signed)
PPM Remote  

## 2019-05-01 ENCOUNTER — Ambulatory Visit: Payer: BC Managed Care – PPO | Attending: Internal Medicine

## 2019-05-01 DIAGNOSIS — Z23 Encounter for immunization: Secondary | ICD-10-CM | POA: Insufficient documentation

## 2019-05-01 NOTE — Progress Notes (Signed)
   Covid-19 Vaccination Clinic  Name:  Alejandra Harrison    MRN: [id number] DOB: 1961-01-10  05/01/2019  [name] was observed post Covid-19 immunization for 15 minutes without incident. She was provided with Vaccine Information Sheet and instruction to access the V-Safe system.   [name] was instructed to call 911 with any severe reactions post vaccine: Marland Kitchen Difficulty breathing  . Swelling of face and throat  . A fast heartbeat  . A bad rash all over body  . Dizziness and weakness   Immunizations Administered    Name Date Dose VIS Date Route   Pfizer COVID-19 Vaccine 05/01/2019  1:51 PM 0.3 mL 02/04/2019 Intramuscular   Manufacturer: [employer], [employer]   Lot: [id number]   NDC: [address]-3

## 2019-05-22 ENCOUNTER — Ambulatory Visit: Payer: BC Managed Care – PPO | Attending: Internal Medicine

## 2019-05-22 DIAGNOSIS — Z23 Encounter for immunization: Secondary | ICD-10-CM

## 2019-05-22 NOTE — Progress Notes (Signed)
   Covid-19 Vaccination Clinic  Name:  AERILYN SLEE    MRN: [id number] DOB: 06/04/1960  05/22/2019  Ms. Hentges was observed post Covid-19 immunization for 15 minutes without incident. She was provided with Vaccine Information Sheet and instruction to access the V-Safe system.   Ms. Hiltz was instructed to call 911 with any severe reactions post vaccine: Marland Kitchen Difficulty breathing  . Swelling of face and throat  . A fast heartbeat  . A bad rash all over body  . Dizziness and weakness   Immunizations Administered    Name Date Dose VIS Date Route   Pfizer COVID-19 Vaccine 05/22/2019 12:42 PM 0.3 mL 02/04/2019 Intramuscular   Manufacturer: [employer], [employer]   Lot: [id number]   NDC: [address]-0

## 2019-07-26 ENCOUNTER — Ambulatory Visit (INDEPENDENT_AMBULATORY_CARE_PROVIDER_SITE_OTHER): Payer: BC Managed Care – PPO | Admitting: *Deleted

## 2019-07-26 DIAGNOSIS — I442 Atrioventricular block, complete: Secondary | ICD-10-CM | POA: Diagnosis not present

## 2019-07-27 LAB — CUP PACEART REMOTE DEVICE CHECK
Battery Remaining Longevity: 125 mo
Battery Remaining Percentage: 95.5 %
Battery Voltage: 2.96 V
Brady Statistic AP VP Percent: 7.3 %
Brady Statistic AP VS Percent: 1 %
Brady Statistic AS VP Percent: 93 %
Brady Statistic AS VS Percent: 1 %
Brady Statistic RA Percent Paced: 7 %
Brady Statistic RV Percent Paced: 99 %
Date Time Interrogation Session: 20210602164257
Implantable Lead Implant Date: 20170323
Implantable Lead Implant Date: 20170323
Implantable Lead Location: 753859
Implantable Lead Location: 753860
Implantable Pulse Generator Implant Date: 20170323
Lead Channel Impedance Value: 450 Ohm
Lead Channel Impedance Value: 600 Ohm
Lead Channel Pacing Threshold Amplitude: 0.625 V
Lead Channel Pacing Threshold Amplitude: 0.75 V
Lead Channel Pacing Threshold Pulse Width: 0.4 ms
Lead Channel Pacing Threshold Pulse Width: 0.4 ms
Lead Channel Sensing Intrinsic Amplitude: 12 mV
Lead Channel Sensing Intrinsic Amplitude: 2.9 mV
Lead Channel Setting Pacing Amplitude: 1 V
Lead Channel Setting Pacing Amplitude: 1.625
Lead Channel Setting Pacing Pulse Width: 0.4 ms
Lead Channel Setting Sensing Sensitivity: 4 mV
Pulse Gen Model: 2272
Pulse Gen Serial Number: 3134337

## 2019-08-01 NOTE — Progress Notes (Signed)
Remote pacemaker transmission.   

## 2019-08-02 ENCOUNTER — Other Ambulatory Visit: Payer: Self-pay

## 2019-08-02 ENCOUNTER — Ambulatory Visit: Payer: BC Managed Care – PPO | Admitting: Family

## 2019-08-02 ENCOUNTER — Encounter: Payer: Self-pay | Admitting: Family

## 2019-08-02 VITALS — BP 131/77 | HR 68 | Temp 97.6°F | Ht 64.0 in | Wt 248.4 lb

## 2019-08-02 DIAGNOSIS — L0100 Impetigo, unspecified: Secondary | ICD-10-CM | POA: Diagnosis not present

## 2019-08-02 MED ORDER — MUPIROCIN 2 % EX OINT: 1.0000 "application " | TOPICAL_OINTMENT | Freq: Two times a day (BID) | CUTANEOUS | 0 refills | Status: DC

## 2019-08-02 NOTE — Patient Instructions (Signed)
Impetigo, Adult Impetigo is an infection of the skin. It commonly occurs in young children, but it can also occur in adults. The infection causes itchy blisters and sores that produce brownish-yellow fluid. As the fluid dries, it forms a thick, honey-colored crust. These skin changes usually occur on the face, but they can also affect other areas of the body. Impetigo usually goes away in 7-10 days with treatment. What are the causes? This condition is caused by two types of bacteria. It may be caused by staphylococci or streptococci bacteria. These bacteria cause impetigo when they get under the surface of the skin. This often happens after some damage to the skin, such as:  Cuts, scrapes, or scratches.  Rashes.  Insect bites, especially when you scratch the area of a bite.  Chickenpox or other illnesses that cause open skin sores.  Nail biting or chewing. Impetigo can spread easily from one person to another (is contagious). It may be spread through close skin contact or by sharing towels, clothing, or other items that an infected person has touched. What increases the risk? The following factors may make you more likely to develop this condition:  Playing sports that include skin-to-skin contact with others.  Having a skin condition with open sores, such as chickenpox.  Having diabetes.  Having a weak body defense system (immune system).  Having many skin cuts or scrapes.  Living in an area that has high humidity levels.  Having poor hygiene.  Having high levels of staphylococci in your nose. What are the signs or symptoms? The main symptom of this condition is small blisters, often on the face around the mouth and nose. In time, the blisters break open and turn into tiny sores (lesions) with a yellow crust. In some cases, the blisters cause itching or burning. With scratching, irritation, or lack of treatment, these small lesions may get larger. Other possible symptoms  include:  Larger blisters.  Pus.  Swollen lymph glands. Scratching the affected area can cause impetigo to spread to other parts of the body. The bacteria can get under the fingernails and spread when you touch another area of your skin. How is this diagnosed? This condition is usually diagnosed during a physical exam. A skin sample or a sample of fluid from a blister may be taken for lab tests that involve growing bacteria (culture test). Lab tests can help to confirm the diagnosis or help to determine the best treatment. How is this treated? Treatment for this condition depends on the severity of the condition:  Mild impetigo can be treated with prescription antibiotic cream.  Oral antibiotic medicine may be used in more severe cases.  Medicines that reduce itchiness (antihistamines)may also be used. Follow these instructions at home: Medicines  Take over-the-counter and prescription medicines only as told by your health care provider.  Apply or take your antibiotic as told by your health care provider. Do not stop using the antibiotic even if your condition improves. General instructions   To help prevent impetigo from spreading to other body areas: ? Keep your fingernails short and clean. ? Do not scratch the blisters or sores. ? Cover infected areas, if necessary, to keep from scratching. ? Wash your hands often with soap and warm water.  Before applying antibiotic cream or ointment, you should: ? Gently wash the infected areas with antibacterial soap and warm water. ? Soak crusted areas in warm, soapy water using antibacterial soap. ? Gently rub the areas to remove crusts. Do not   scrub.  Do not share towels.  Wash your clothing and bedsheets in warm water that is 140F (60C) or warmer.  Stay home until you have used an antibiotic cream for 48 hours (2 days) or an oral antibiotic medicine for 24 hours (1 day). You should only return to work and activities with other  people if your skin shows significant improvement. ? You may return to contact sports after you have used antibiotic medicine for 72 hours (3 days).  Keep all follow-up visits as told by your health care provider. This is important. How is this prevented?  Wash your hands often with soap and warm water.  Do not share towels, washcloths, clothing, bedding, or razors.  Keep your fingernails short.  Keep any cuts, scrapes, bug bites, or rashes clean and covered.  Use insect repellent to prevent bug bites. Contact a health care provider if:  You develop more blisters or sores even with treatment.  Other family members get sores.  Your skin sores are not improving after 72 hours (3 days) of treatment.  You have a fever. Get help right away if:  You see spreading redness or swelling of the skin around your sores.  You see red streaks coming from your sores.  You develop a sore throat.  The area around your rash becomes warm, red, or tender to the touch.  You have dark, reddish-brown urine.  You do not urinate often or you urinate small amounts.  You are very tired (lethargic).  You have swelling in the face, hands, or feet. Summary  Impetigo is a skin infection that causes itchy blisters and sores that produce brownish-yellow fluid. As the fluid dries, it forms a crust.  This condition is caused by staphylococci or streptococci bacteria. These bacteria cause impetigo when they get under the surface of the skin, such as through cuts, rashes, bug bites, or open sores.  Treatment for this condition may include antibiotic ointment or oral antibiotics.  To help prevent impetigo from spreading to other body areas, make sure you keep your fingernails short, avoid scratching, cover any blisters, and wash your hands often.  If you have impetigo, stay home until you have used an antibiotic cream for 48 hours (2 days) or an oral antibiotic medicine for 24 hours (1 day). You should  only return to work and activities with other people if your skin shows significant improvement. This information is not intended to replace advice given to you by your health care provider. Make sure you discuss any questions you have with your health care provider. Document Revised: 03/23/2018 Document Reviewed: 03/04/2016 Elsevier Patient Education  2020 Elsevier Inc.  

## 2019-08-02 NOTE — Progress Notes (Signed)
   Subjective:    Patient ID: Alejandra Harrison, female    DOB: [birth year], 59 y.o.   MRN: [id number]  Chief Complaint  Patient presents with  . Sore    under nose x 1 week     HPI Pt presents to the office today with a skin sore under her left nostril that she noticed about a week ago. She states it looked like a "bump" with a white head, but it never drained anything. She reports it is not crusted with a yellow discharge.  She states it is unchanged over the last few days.    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    Review of Systems  All other systems reviewed and are negative.      Objective:   Physical Exam Vitals reviewed.  Constitutional:      General: [name] not in acute distress.    Appearance: [name] well-developed.  HENT:     Head: Normocephalic and atraumatic.     Right Ear: Tympanic membrane normal.     Left Ear: Tympanic membrane normal.  Eyes:     Pupils: Pupils are equal, round, and reactive to light.  Neck:     Thyroid: No thyromegaly.  Cardiovascular:     Rate and Rhythm: Normal rate and regular rhythm.     Heart sounds: Normal heart sounds. No murmur.  Pulmonary:     Effort: Pulmonary effort is normal. No respiratory distress.     Breath sounds: Normal breath sounds. No wheezing.  Abdominal:     General: Bowel sounds are normal. There is no distension.     Palpations: Abdomen is soft.     Tenderness: There is no abdominal tenderness.  Musculoskeletal:        General: No tenderness. Normal range of motion.     Cervical back: Normal range of motion and neck supple.  Skin:    General: Skin is warm and dry.     Findings: Rash present.     Comments: S  mall scab lesion with a yellow crusted discharge  Neurological:     Mental Status: [name] alert and oriented to person, place, and time.     Cranial Nerves: No cranial nerve deficit.     Deep Tendon Reflexes: Reflexes are normal and symmetric.    Psychiatric:        Behavior: Behavior normal.        Thought Content: Thought content normal.        Judgment: Judgment normal.      BP 131/77   Pulse 68   Temp 97.6 F (36.4 C) (Temporal)   Ht 5\' 4"  (1.626 m)   Wt 248 lb 6.4 oz (112.7 kg)   SpO2 97%   BMI 42.64 kg/m       Assessment & Plan:  Alejandra Harrison comes in today with chief complaint of Sore (under nose x 1 week )   Diagnosis and orders addressed:  1. Impetigo Wash hands  Do not pick at RTO if symptoms worsen or do not improve  - mupirocin ointment (BACTROBAN) 2 %; Apply 1 application topically 2 (two) times daily.  Dispense: 22 g; Refill: 0    Alejandra Shu, FNP

## 2019-08-04 ENCOUNTER — Other Ambulatory Visit: Payer: Self-pay | Admitting: Cardiology

## 2019-08-04 ENCOUNTER — Other Ambulatory Visit: Payer: Self-pay

## 2019-08-04 ENCOUNTER — Encounter: Payer: Self-pay | Admitting: Cardiology

## 2019-08-04 ENCOUNTER — Ambulatory Visit: Payer: BC Managed Care – PPO | Admitting: Cardiology

## 2019-08-04 VITALS — BP 126/80 | HR 71 | Ht 64.0 in | Wt 249.8 lb

## 2019-08-04 DIAGNOSIS — I442 Atrioventricular block, complete: Secondary | ICD-10-CM

## 2019-08-04 LAB — CUP PACEART INCLINIC DEVICE CHECK
Battery Remaining Longevity: 126 mo
Battery Voltage: 2.96 V
Brady Statistic RA Percent Paced: 6.9 %
Brady Statistic RV Percent Paced: 99.8 %
Date Time Interrogation Session: 20210610134800
Implantable Lead Implant Date: 20170323
Implantable Lead Implant Date: 20170323
Implantable Lead Location: 753859
Implantable Lead Location: 753860
Implantable Pulse Generator Implant Date: 20170323
Lead Channel Impedance Value: 425 Ohm
Lead Channel Impedance Value: 575 Ohm
Lead Channel Pacing Threshold Amplitude: 0.625 V
Lead Channel Pacing Threshold Amplitude: 0.75 V
Lead Channel Pacing Threshold Pulse Width: 0.4 ms
Lead Channel Pacing Threshold Pulse Width: 0.4 ms
Lead Channel Sensing Intrinsic Amplitude: 2.7 mV
Lead Channel Setting Pacing Amplitude: 1 V
Lead Channel Setting Pacing Amplitude: 1.625
Lead Channel Setting Pacing Pulse Width: 0.4 ms
Lead Channel Setting Sensing Sensitivity: 4 mV
Pulse Gen Model: 2272
Pulse Gen Serial Number: 3134337

## 2019-08-04 NOTE — Progress Notes (Signed)
Electrophysiology Office Note   Date:  08/04/2019   ID:  Alejandra Harrison, DOB 08-Jun-1960, MRN [id number]  PCP:  Alejandra Balloon, FNP  Primary Electrophysiologist:  Alejandra Haw, Harrison    No chief complaint on file.    History of Present Illness: Alejandra Harrison is a 59 y.o. female who presents today for electrophysiology evaluation.   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   Today, denies symptoms of palpitations, chest pain, shortness of breath, orthopnea, PND, lower extremity edema, claudication, dizziness, presyncope, syncope, bleeding, or neurologic sequela. The patient is tolerating medications without difficulties.  She currently feels well without complaints.  Past Medical History:  Diagnosis Date  . Anxiety   . AV block, complete (HCC)   . History of seasonal allergies   . Psychosis [facility])    Past Surgical History:  Procedure Laterality Date  . COLONOSCOPY N/A 09/28/2015   Procedure: COLONOSCOPY;  Surgeon: Alejandra Binder, Harrison;  [facility];  Service: Endoscopy;  Laterality: N/A;  1030  . EP IMPLANTABLE DEVICE N/A 05/17/2015   Procedure: Pacemaker Implant;  Surgeon: Alejandra Harrison;  Location: [place] CV LAB;  Service: Cardiovascular;  Laterality: N/A;  . INSERT / REPLACE / REMOVE PACEMAKER  05/17/2015  . TUBAL LIGATION       Current Outpatient Medications  Medication Sig Dispense Refill  . ARIPiprazole (ABILIFY) 2 MG tablet Take 0.5 tablets (1 mg total) by mouth daily. 30 tablet 5  . metoprolol tartrate (LOPRESSOR) 50 MG tablet Take 1 tablet by mouth twice daily 180 tablet 3  . mupirocin ointment (BACTROBAN) 2 % Apply 1 application topically 2 (two) times daily. 22 g 0  . Vitamin D, Ergocalciferol, (DRISDOL) 1.25 MG (50000 UNIT) CAPS capsule Take 1 capsule by mouth once a week 12 capsule 0   No current facility-administered medications for this visit.    Allergies:   Oxycodone-acetaminophen and  Penicillins   Social History:  The patient  reports that she has never smoked. She has never used smokeless tobacco. She reports that she does not drink alcohol and does not use drugs.   Family History:  The patient's family history includes Breast cancer in her paternal aunt; Dementia in her mother; Diabetes in her father.   ROS:  Please see the history of present illness.   Otherwise, review of systems is positive for none.   All other systems are reviewed and negative.   PHYSICAL EXAM: VS:  BP 126/80   Pulse 71   Ht 5\' 4"  (1.626 m)   Wt 249 lb 12.8 oz (113.3 kg)   SpO2 94%   BMI 42.88 kg/m  , BMI Body mass index is 42.88 kg/m. GEN: Well nourished, well developed, in no acute distress  HEENT: normal  Neck: no JVD, carotid bruits, or masses Cardiac: RRR; no murmurs, rubs, or gallops,no edema  Respiratory:  clear to auscultation bilaterally, normal work of breathing GI: soft, nontender, nondistended, + BS MS: no deformity or atrophy  Skin: warm and dry, device site well healed Neuro:  Strength and sensation are intact Psych: euthymic mood, full affect  EKG:  EKG is ordered today. Personal review of the ekg ordered shows sinus rhythm, ventricular paced  Personal review of the device interrogation today. Results in [place]: 08/12/2018: ALT 39; BUN 9; Creatinine, Ser 0.72; Hemoglobin 14.5; Platelets 318; Potassium 4.3; Sodium 139; TSH 2.270   Lipid  Panel     Component Value Date/Time   CHOL 170 08/12/2018 1251   TRIG 104 08/12/2018 1251   HDL 49 08/12/2018 1251   CHOLHDL 3.5 08/12/2018 1251   LDLCALC 100 (H) 08/12/2018 1251   Wt Readings from Last 3 Encounters:  08/04/19 249 lb 12.8 oz (113.3 kg)  08/02/19 248 lb 6.4 oz (112.7 kg)  08/12/18 248 lb 9.6 oz (112.8 kg)    TTE 07/16/16 - Left ventricle: The cavity size was normal. Wall thickness was   increased increased in a pattern of mild to moderate LVH.   Systolic function was normal. The estimated  ejection fraction was   in the range of 60% to 65%. Doppler parameters are consistent   with abnormal left ventricular relaxation (grade 1 diastolic   dysfunction). - Aortic valve: Mildly calcified annulus. Trileaflet; normal   thickness leaflets. Valve area (VTI): 2.55 cm^2. Valve area   (Vmax): 2.18 cm^2. Valve area (Vmean): 2.17 cm^2. - Technically adequate study.  ASSESSMENT AND PLAN:  1.  Complete heart block: Status post Saint Jude dual-chamber pacemaker implanted 05/17/2015.  Device functioning appropriately.  No changes at this time.    2. Nonsustained ventricular tachycardia:  Currently on metoprolol without further episodes.  No changes.  Current med no medicines are reviewed at length with the patient today.   The patient does not have concerns regarding her medicines.  The following changes were made today: none  Labs/ tests ordered today include:  Orders Placed This Encounter  Procedures  . EKG 12-Lead   Disposition:   FU with Alejandra Harrison 12 months  Signed, Alejandra Gravlin Jorja Loa, Harrison  08/04/2019 2:03 PM     [facility] [address] [place] [address] [phone] (office) [phone] (fax)

## 2019-08-10 ENCOUNTER — Other Ambulatory Visit: Payer: Self-pay | Admitting: Family

## 2019-08-10 DIAGNOSIS — Z1231 Encounter for screening mammogram for malignant neoplasm of breast: Secondary | ICD-10-CM

## 2019-08-24 ENCOUNTER — Encounter: Payer: Self-pay | Admitting: Family

## 2019-08-24 ENCOUNTER — Other Ambulatory Visit: Payer: Self-pay

## 2019-08-24 ENCOUNTER — Ambulatory Visit (INDEPENDENT_AMBULATORY_CARE_PROVIDER_SITE_OTHER): Payer: BC Managed Care – PPO | Admitting: Family

## 2019-08-24 ENCOUNTER — Ambulatory Visit
Admission: RE | Admit: 2019-08-24 | Discharge: 2019-08-24 | Disposition: A | Discharge status: Home / Self Care | Payer: BC Managed Care – PPO | Source: Ambulatory Visit | Attending: Family | Admitting: Family

## 2019-08-24 VITALS — BP 127/75 | HR 66 | Temp 96.6°F | Ht 64.0 in | Wt 252.8 lb

## 2019-08-24 DIAGNOSIS — E538 Deficiency of other specified B group vitamins: Secondary | ICD-10-CM

## 2019-08-24 DIAGNOSIS — Z1231 Encounter for screening mammogram for malignant neoplasm of breast: Secondary | ICD-10-CM

## 2019-08-24 DIAGNOSIS — E559 Vitamin D deficiency, unspecified: Secondary | ICD-10-CM

## 2019-08-24 DIAGNOSIS — Z0001 Encounter for general adult medical examination with abnormal findings: Secondary | ICD-10-CM | POA: Diagnosis not present

## 2019-08-24 DIAGNOSIS — I442 Atrioventricular block, complete: Secondary | ICD-10-CM | POA: Diagnosis not present

## 2019-08-24 DIAGNOSIS — I459 Conduction disorder, unspecified: Secondary | ICD-10-CM | POA: Diagnosis not present

## 2019-08-24 DIAGNOSIS — K219 Gastro-esophageal reflux disease without esophagitis: Secondary | ICD-10-CM | POA: Diagnosis not present

## 2019-08-24 DIAGNOSIS — Z0000 Encounter for general adult medical examination without abnormal findings: Secondary | ICD-10-CM

## 2019-08-24 MED ORDER — VITAMIN D (ERGOCALCIFEROL) 1.25 MG (50000 UNIT) PO CAPS: 50000.0000 [IU] | ORAL_CAPSULE | ORAL | 3 refills | Status: DC

## 2019-08-24 NOTE — Patient Instructions (Signed)
Health Maintenance, Female Adopting a healthy lifestyle and getting preventive care are important in promoting health and wellness. Ask your health care provider about:  The right schedule for you to have regular tests and exams.  Things you can do on your own to prevent diseases and keep yourself healthy. What should I know about diet, weight, and exercise? Eat a healthy diet   Eat a diet that includes plenty of vegetables, fruits, low-fat dairy products, and lean protein.  Do not eat a lot of foods that are high in solid fats, added sugars, or sodium. Maintain a healthy weight Body mass index (BMI) is used to identify weight problems. It estimates body fat based on height and weight. Your health care provider can help determine your BMI and help you achieve or maintain a healthy weight. Get regular exercise Get regular exercise. This is one of the most important things you can do for your health. Most adults should:  Exercise for at least 150 minutes each week. The exercise should increase your heart rate and make you sweat (moderate-intensity exercise).  Do strengthening exercises at least twice a week. This is in addition to the moderate-intensity exercise.  Spend less time sitting. Even light physical activity can be beneficial. Watch cholesterol and blood lipids Have your blood tested for lipids and cholesterol at 60 years of age, then have this test every 5 years. Have your cholesterol levels checked more often if:  Your lipid or cholesterol levels are high.  You are older than 60 years of age.  You are at high risk for heart disease. What should I know about cancer screening? Depending on your health history and family history, you may need to have cancer screening at various ages. This may include screening for:  Breast cancer.  Cervical cancer.  Colorectal cancer.  Skin cancer.  Lung cancer. What should I know about heart disease, diabetes, and high blood  pressure? Blood pressure and heart disease  High blood pressure causes heart disease and increases the risk of stroke. This is more likely to develop in people who have high blood pressure readings, are of African descent, or are overweight.  Have your blood pressure checked: ? Every 3-5 years if you are 18-39 years of age. ? Every year if you are 40 years old or older. Diabetes Have regular diabetes screenings. This checks your fasting blood sugar level. Have the screening done:  Once every three years after age 40 if you are at a normal weight and have a low risk for diabetes.  More often and at a younger age if you are overweight or have a high risk for diabetes. What should I know about preventing infection? Hepatitis B If you have a higher risk for hepatitis B, you should be screened for this virus. Talk with your health care provider to find out if you are at risk for hepatitis B infection. Hepatitis C Testing is recommended for:  Everyone born from 1945 through 1965.  Anyone with known risk factors for hepatitis C. Sexually transmitted infections (STIs)  Get screened for STIs, including gonorrhea and chlamydia, if: ? You are sexually active and are younger than 60 years of age. ? You are older than 60 years of age and your health care provider tells you that you are at risk for this type of infection. ? Your sexual activity has changed since you were last screened, and you are at increased risk for chlamydia or gonorrhea. Ask your health care provider if   you are at risk.  Ask your health care provider about whether you are at high risk for HIV. Your health care provider may recommend a prescription medicine to help prevent HIV infection. If you choose to take medicine to prevent HIV, you should first get tested for HIV. You should then be tested every 3 months for as long as you are taking the medicine. Pregnancy  If you are about to stop having your period (premenopausal) and  you may become pregnant, seek counseling before you get pregnant.  Take 400 to 800 micrograms (mcg) of folic acid every day if you become pregnant.  Ask for birth control (contraception) if you want to prevent pregnancy. Osteoporosis and menopause Osteoporosis is a disease in which the bones lose minerals and strength with aging. This can result in bone fractures. If you are 65 years old or older, or if you are at risk for osteoporosis and fractures, ask your health care provider if you should:  Be screened for bone loss.  Take a calcium or vitamin D supplement to lower your risk of fractures.  Be given hormone replacement therapy (HRT) to treat symptoms of menopause. Follow these instructions at home: Lifestyle  Do not use any products that contain nicotine or tobacco, such as cigarettes, e-cigarettes, and chewing tobacco. If you need help quitting, ask your health care provider.  Do not use street drugs.  Do not share needles.  Ask your health care provider for help if you need support or information about quitting drugs. Alcohol use  Do not drink alcohol if: ? Your health care provider tells you not to drink. ? You are pregnant, may be pregnant, or are planning to become pregnant.  If you drink alcohol: ? Limit how much you use to 0-1 drink a day. ? Limit intake if you are breastfeeding.  Be aware of how much alcohol is in your drink. In the [place] one drink equals one 12 oz bottle of beer (355 mL), one 5 oz glass of wine (148 mL), or one 1 oz glass of hard liquor (44 mL). General instructions  Schedule regular health, dental, and eye exams.  Stay current with your vaccines.  Tell your health care provider if: ? You often feel depressed. ? You have ever been abused or do not feel safe at home. Summary  Adopting a healthy lifestyle and getting preventive care are important in promoting health and wellness.  Follow your health care provider's instructions about healthy  diet, exercising, and getting tested or screened for diseases.  Follow your health care provider's instructions on monitoring your cholesterol and blood pressure. This information is not intended to replace advice given to you by your health care provider. Make sure you discuss any questions you have with your health care provider. Document Revised: 02/03/2018 Document Reviewed: 02/03/2018 Elsevier Patient Education  2020 Elsevier Inc.  

## 2019-08-24 NOTE — Progress Notes (Signed)
Subjective:    Patient ID: Alejandra Harrison, female    DOB: 1960/09/06, 59 y.o.   MRN: [id number]  Chief Complaint  Patient presents with   Annual Exam    no pap   Pt presents to the office today for CPE with pap. [name] followed by Cardiologists annually for heart block and has a pacemaker. [name] followed by [facility] every 6 months for Depression and GAD.    Gastroesophageal Reflux She complains of belching and heartburn. This is a chronic problem. The current episode started more than 1 year ago. The problem occurs occasionally. The symptoms are aggravated by certain foods. Risk factors include obesity. She has tried an antacid for the symptoms. The treatment provided moderate relief.  Anxiety Presents for follow-up visit. Symptoms include depressed mood, irritability, nervous/anxious behavior and restlessness. Symptoms occur occasionally. The severity of symptoms is moderate.    Depression        This is a chronic problem.  The current episode started more than 1 year ago.   The onset quality is gradual.   The problem occurs intermittently.  Associated symptoms include irritable, restlessness and decreased interest.  Associated symptoms include no helplessness and no hopelessness.  Past medical history includes anxiety.       Review of Systems  Constitutional: Positive for irritability.  Gastrointestinal: Positive for heartburn.  Psychiatric/Behavioral: Positive for depression. The patient is nervous/anxious.    Family History  Problem Relation Age of Onset   Dementia Mother    Diabetes Father    Breast cancer Paternal Aunt    ADD / ADHD Neg Hx    Alcohol abuse Neg Hx    Drug abuse Neg Hx    Anxiety disorder Neg Hx    Bipolar disorder Neg Hx    Depression Neg Hx    OCD Neg Hx    Paranoid behavior Neg Hx    Schizophrenia Neg Hx    Seizures Neg Hx    Sexual abuse Neg Hx    Physical abuse Neg Hx    Colon cancer Neg Hx    Social History    Socioeconomic History   Marital status: Married    Spouse name: Not on file   Number of children: Not on file   Years of education: Not on file   Highest education level: Not on file  Occupational History   Not on file  Tobacco Use   Smoking status: Never Smoker   Smokeless tobacco: Never Used   Tobacco comment: Never smoked  Vaping Use   Vaping Use: Never used  Substance and Sexual Activity   Alcohol use: No    Alcohol/week: 0.0 standard drinks   Drug use: No   Sexual activity: Not Currently    Birth control/protection: Post-menopausal  Other Topics Concern   Not on file  Social History Narrative   Not on file   Social Determinants of Health   Financial Resource Strain:    Difficulty of Paying Living Expenses:   Food Insecurity:    Worried About Charity fundraiser in the Last Year:    Arboriculturist in the Last Year:   Transportation Needs:    Film/video editor (Medical):    Lack of Transportation (Non-Medical):   Physical Activity:    Days of Exercise per Week:    Minutes of Exercise per Session:   Stress:    Feeling of Stress :   Social Connections:    Frequency of  Communication with Friends and Family:    Frequency of Social Gatherings with Friends and Family:    Attends Religious Services:    Active Member of Clubs or Organizations:    Attends Archivist Meetings:    Marital Status:        Objective:   Physical Exam Vitals reviewed.  Constitutional:      General: [name] irritable. [name] not in acute distress.    Appearance: [name] well-developed.  HENT:     Head: Normocephalic and atraumatic.     Right Ear: Tympanic membrane normal.     Left Ear: Tympanic membrane normal.  Eyes:     Pupils: Pupils are equal, round, and reactive to light.  Neck:     Thyroid: No thyromegaly.  Cardiovascular:     Rate and Rhythm: Normal rate and regular rhythm.     Heart sounds: Normal heart sounds. No murmur heard.    Pulmonary:     Effort: Pulmonary effort is normal. No respiratory distress.     Breath sounds: Normal breath sounds. No wheezing.  Abdominal:     General: Bowel sounds are normal. There is no distension.     Palpations: Abdomen is soft.     Tenderness: There is no abdominal tenderness.  Musculoskeletal:        General: No tenderness. Normal range of motion.     Cervical back: Normal range of motion and neck supple.  Skin:    General: Skin is warm and dry.  Neurological:     Mental Status: [name] alert and oriented to person, place, and time.     Cranial Nerves: No cranial nerve deficit.     Deep Tendon Reflexes: Reflexes are normal and symmetric.  Psychiatric:        Behavior: Behavior normal.        Thought Content: Thought content normal.        Judgment: Judgment normal.       BP 127/75    Pulse 66    Temp (!) 96.6 F (35.9 C) (Temporal)    Ht '5\' 4"'$  (1.626 m)    Wt 252 lb 12.8 oz (114.7 kg)    BMI 43.39 kg/m      Assessment & Plan:  Alejandra Harrison comes in today with chief complaint of Annual Exam (no pap)   Diagnosis and orders addressed:  1. CHB (complete heart block) (HCC) - CMP14+EGFR - CBC with Differential/Platelet  2. Heart block - CMP14+EGFR - CBC with Differential/Platelet  3. Gastroesophageal reflux disease, unspecified whether esophagitis present - CMP14+EGFR - CBC with Differential/Platelet  4. Annual physical exam - Vitamin D, Ergocalciferol, (DRISDOL) 1.25 MG (50000 UNIT) CAPS capsule; Take 1 capsule (50,000 Units total) by mouth once a week.  Dispense: 12 capsule; Refill: 3 - CMP14+EGFR - CBC with Differential/Platelet - Lipid panel - TSH - Vitamin B12 - VITAMIN D 25 Hydroxy (Vit-D Deficiency, Fractures)  5. Morbid obesity ([place])  6. Vitamin B 12 deficiency - Vitamin B12  7. Vitamin D deficiency - VITAMIN D 25 Hydroxy (Vit-D Deficiency, Fractures)   Labs pending Health Maintenance reviewed Diet and exercise encouraged  Follow up  plan: 1 year    Evelina Dun, FNP

## 2019-08-25 LAB — CBC WITH DIFFERENTIAL/PLATELET
Basophils Absolute: 0.1 10*3/uL (ref 0.0–0.2)
Basos: 1 %
EOS (ABSOLUTE): 0.3 10*3/uL (ref 0.0–0.4)
Eos: 3 %
Hematocrit: 43.1 % (ref 34.0–46.6)
Hemoglobin: 14.4 g/dL (ref 11.1–15.9)
Immature Grans (Abs): 0 10*3/uL (ref 0.0–0.1)
Immature Granulocytes: 0 %
Lymphocytes Absolute: 2 10*3/uL (ref 0.7–3.1)
Lymphs: 23 %
MCH: 30 pg (ref 26.6–33.0)
MCHC: 33.4 g/dL (ref 31.5–35.7)
MCV: 90 fL (ref 79–97)
Monocytes Absolute: 0.6 10*3/uL (ref 0.1–0.9)
Monocytes: 7 %
Neutrophils Absolute: 5.7 10*3/uL (ref 1.4–7.0)
Neutrophils: 66 %
Platelets: 296 10*3/uL (ref 150–450)
RBC: 4.8 x10E6/uL (ref 3.77–5.28)
RDW: 13.3 % (ref 11.7–15.4)
WBC: 8.7 10*3/uL (ref 3.4–10.8)

## 2019-08-25 LAB — CMP14+EGFR
ALT: 32 IU/L (ref 0–32)
AST: 23 IU/L (ref 0–40)
Albumin/Globulin Ratio: 1.6 (ref 1.2–2.2)
Albumin: 4.2 g/dL (ref 3.8–4.9)
Alkaline Phosphatase: 83 IU/L (ref 48–121)
BUN/Creatinine Ratio: 10 — ABNORMAL LOW (ref 12–28)
BUN: 7 mg/dL — ABNORMAL LOW (ref 8–27)
Bilirubin Total: 0.4 mg/dL (ref 0.0–1.2)
CO2: 22 mmol/L (ref 20–29)
Calcium: 9.3 mg/dL (ref 8.7–10.3)
Chloride: 103 mmol/L (ref 96–106)
Creatinine, Ser: 0.7 mg/dL (ref 0.57–1.00)
GFR calc Af Amer: 109 mL/min/{1.73_m2} (ref >59)
GFR calc non Af Amer: 94 mL/min/{1.73_m2} (ref >59)
Globulin, Total: 2.7 g/dL (ref 1.5–4.5)
Glucose: 79 mg/dL (ref 65–99)
Potassium: 4.8 mmol/L (ref 3.5–5.2)
Sodium: 139 mmol/L (ref 134–144)
Total Protein: 6.9 g/dL (ref 6.0–8.5)

## 2019-08-25 LAB — LIPID PANEL
Chol/HDL Ratio: 3.4 ratio (ref 0.0–4.4)
Cholesterol, Total: 165 mg/dL (ref 100–199)
HDL: 48 mg/dL (ref >39)
LDL Chol Calc (NIH): 96 mg/dL (ref 0–99)
Triglycerides: 116 mg/dL (ref 0–149)
VLDL Cholesterol Cal: 21 mg/dL (ref 5–40)

## 2019-08-25 LAB — VITAMIN B12: Vitamin B-12: 278 pg/mL (ref 232–1245)

## 2019-08-25 LAB — VITAMIN D 25 HYDROXY (VIT D DEFICIENCY, FRACTURES): Vit D, 25-Hydroxy: 33.3 ng/mL (ref 30.0–100.0)

## 2019-08-25 LAB — TSH: TSH: 2.61 u[IU]/mL (ref 0.450–4.500)

## 2019-08-31 ENCOUNTER — Other Ambulatory Visit: Payer: Self-pay

## 2019-08-31 ENCOUNTER — Encounter (HOSPITAL_COMMUNITY): Payer: Self-pay | Admitting: Psychiatry

## 2019-08-31 ENCOUNTER — Telehealth (INDEPENDENT_AMBULATORY_CARE_PROVIDER_SITE_OTHER): Payer: BC Managed Care – PPO | Admitting: Psychiatry

## 2019-08-31 DIAGNOSIS — F23 Brief psychotic disorder: Secondary | ICD-10-CM | POA: Diagnosis not present

## 2019-08-31 MED ORDER — ARIPIPRAZOLE 2 MG PO TABS: 1.0000 mg | ORAL_TABLET | Freq: Every day | ORAL | 5 refills | Status: DC

## 2019-08-31 NOTE — Progress Notes (Signed)
Virtual Visit via Telephone Note  I connected with Alejandra Harrison on 08/31/19 at 10:00 AM EDT by telephone and verified that I am speaking with the correct person using two identifiers.   I discussed the limitations, risks, security and privacy concerns of performing an evaluation and management service by telephone and the availability of in person appointments. I also discussed with the patient that there may be a patient responsible charge related to this service. The patient expressed understanding and agreed to proceed.    I discussed the assessment and treatment plan with the patient. The patient was provided an opportunity to ask questions and all were answered. The patient agreed with the plan and demonstrated an understanding of the instructions.   The patient was advised to call back or seek an in-person evaluation if the symptoms worsen or if the condition fails to improve as anticipated.  I provided 15 minutes of non-face-to-face time during this encounter.   [name], MD  [facility] MD/PA/NP OP Progress Note  08/31/2019 10:03 AM Alejandra Harrison  MRN:  [id number]  Chief Complaint:  Chief Complaint    Hallucinations; Follow-up     HPI: Thispatient is a 59 year old married white female who lives with her husband and 2 sonsin [place]. 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  The patient states that in 2012 she started seeing a figure that she thought may have been God. She also got fixated on a man in her Sunday school and thought that her husband would die and she would have to marry this man. She was having lots of religious visions. She claims that she had not been on any medication or using drugs or alcohol at that time. She had not had a head injury or any additional stress. She may of been going through menopause. She denied visual hallucinations but claims she felt "high and on top of the world." She's never had a history of depression however.  At any  rate she was seen here by Dr. Toni Arthurs and started on Abilify. She was on a higher dose such as 5 mg in the past and it caused akathisia. Currently she takes 1 mg per day and it seems to be working fairly well. She's no longer having visions or unusual thoughts and she's functioning well in her job. She's not having significant side effects  The patient returns for follow-up after 6 months.  She continues to do well.  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  She still enjoys her job.  [name] spending a lot of time learning new versions of needlework.  Her mood has been good and she is sleeping well.  Her energy is good.  She recently had a physical and all of her laboratories were negative as well as her mammogram.  She denies depression.  She denies auditory visual hallucinations or mood swings.  She denies side effects from Abilify such as jerking or twitching Visit Diagnosis:    ICD-10-CM   1. Brief psychotic disorder (HCC)  F23     Past Psychiatric History: none  Past Medical History:  Past Medical History:  Diagnosis Date  . Anxiety   . AV block, complete (HCC)   . History of seasonal allergies   . Psychosis [facility])     Past Surgical History:  Procedure Laterality Date  . COLONOSCOPY  N/A 09/28/2015   Procedure: COLONOSCOPY;  Surgeon: West Bali, MD;  Location: [facility] ENDO SUITE;  Service: Endoscopy;  Laterality: N/A;  1030  . EP IMPLANTABLE DEVICE N/A 05/17/2015   Procedure: Pacemaker Implant;  Surgeon: Will Jorja Loa, MD;  Location: [facility] INVASIVE CV LAB;  Service: Cardiovascular;  Laterality: N/A;  . INSERT / REPLACE / REMOVE PACEMAKER  05/17/2015  . TUBAL LIGATION      Family Psychiatric History: see below  Family History:  Family History  Problem Relation Age of Onset  . Dementia Mother   . Diabetes Father   . Breast cancer Paternal Aunt   . ADD / ADHD Neg Hx   . Alcohol abuse Neg Hx    . Drug abuse Neg Hx   . Anxiety disorder Neg Hx   . Bipolar disorder Neg Hx   . Depression Neg Hx   . OCD Neg Hx   . Paranoid behavior Neg Hx   . Schizophrenia Neg Hx   . Seizures Neg Hx   . Sexual abuse Neg Hx   . Physical abuse Neg Hx   . Colon cancer Neg Hx     Social History:  Social History   Socioeconomic History  . Marital status: Married    Spouse name: Not on file  . Number of children: Not on file  . Years of education: Not on file  . Highest education level: Not on file  Occupational History  . Not on file  Tobacco Use  . Smoking status: Never Smoker  . Smokeless tobacco: Never Used  . Tobacco comment: Never smoked  Vaping Use  . Vaping Use: Never used  Substance and Sexual Activity  . Alcohol use: No    Alcohol/week: 0.0 standard drinks  . Drug use: No  . Sexual activity: Not Currently    Birth control/protection: Post-menopausal  Other Topics Concern  . Not on file  Social History Narrative  . Not on file   Social Determinants of Health   Financial Resource Strain:   . Difficulty of Paying Living Expenses:   Food Insecurity:   . Worried About Programme researcher, broadcasting/film/video in the Last Year:   . Barista in the Last Year:   Transportation Needs:   . Freight forwarder (Medical):   Marland Kitchen Lack of Transportation (Non-Medical):   Physical Activity:   . Days of Exercise per Week:   . Minutes of Exercise per Session:   Stress:   . Feeling of Stress :   Social Connections:   . Frequency of Communication with Friends and Family:   . Frequency of Social Gatherings with Friends and Family:   . Attends Religious Services:   . Active Member of Clubs or Organizations:   . Attends Banker Meetings:   Marland Kitchen Marital Status:     Allergies:  Allergies  Allergen Reactions  . Oxycodone-Acetaminophen Hives and Other (See Comments)    Hands peeled  . Penicillins Hives and Other (See Comments)    Hands peeled    Metabolic Disorder Labs: No  results found for: HGBA1C, MPG No results found for: PROLACTIN Lab Results  Component Value Date   CHOL 165 08/24/2019   TRIG 116 08/24/2019   HDL 48 08/24/2019   CHOLHDL 3.4 08/24/2019   LDLCALC 96 08/24/2019   LDLCALC 100 (H) 08/12/2018   Lab Results  Component Value Date   TSH 2.610 08/24/2019   TSH 2.270 08/12/2018    Therapeutic Level Labs: No  results found for: LITHIUM No results found for: VALPROATE No components found for:  CBMZ  Current Medications: Current Outpatient Medications  Medication Sig Dispense Refill  . ARIPiprazole (ABILIFY) 2 MG tablet Take 0.5 tablets (1 mg total) by mouth daily. 30 tablet 5  . metoprolol tartrate (LOPRESSOR) 50 MG tablet Take 1 tablet by mouth twice daily 180 tablet 3  . mupirocin ointment (BACTROBAN) 2 % Apply 1 application topically 2 (two) times daily. 22 g 0  . Vitamin D, Ergocalciferol, (DRISDOL) 1.25 MG (50000 UNIT) CAPS capsule Take 1 capsule (50,000 Units total) by mouth once a week. 12 capsule 3   No current facility-administered medications for this visit.     Musculoskeletal: Strength & Muscle Tone: within normal limits Gait & Station: normal Patient leans: N/A  Psychiatric Specialty Exam: Review of Systems  All other systems reviewed and are negative.   There were no vitals taken for this visit.There is no height or weight on file to calculate BMI.  General Appearance: NA  Eye Contact:  NA  Speech:  Clear and Coherent  Volume:  Normal  Mood:  Euthymic  Affect:  NA  Thought Process:  Goal Directed  Orientation:  Full (Time, Place, and Person)  Thought Content: WDL   Suicidal Thoughts:  No  Homicidal Thoughts:  No  Memory:  Immediate;   Good Recent;   Good Remote;   Fair  Judgement:  Good  Insight:  Fair  Psychomotor Activity:  Normal  Concentration:  Concentration: Good and Attention Span: Good  Recall:  Good  Fund of Knowledge: Good  Language: Good  Akathisia:  No  Handed:  Right  AIMS (if  indicated): not done  Assets:  Communication Skills Desire for Improvement Physical Health Resilience Social Support Talents/Skills  ADL's:  Intact  Cognition: WNL  Sleep:  Good   Screenings: PHQ2-9     Office Visit from 08/24/2019 in [facility] Office Visit from 08/02/2019 in [facility] Office Visit from 08/12/2018 in [facility] Office Visit from 02/26/2018 in [facility] Office Visit from 02/18/2018 in [facility]  PHQ-2 Total Score 0 0 0 0 0       Assessment and Plan: This patient is a 59 year old female who has had a psychotic episode in the past.  She has been maintained on Abilify 1 mg daily without side effect of further psychotic or manic symptoms.  She will continue this dosage and return to see me in 6 months   [name], MD 08/31/2019, 10:03 AM

## 2019-10-25 ENCOUNTER — Ambulatory Visit (INDEPENDENT_AMBULATORY_CARE_PROVIDER_SITE_OTHER): Payer: BC Managed Care – PPO | Admitting: *Deleted

## 2019-10-25 DIAGNOSIS — I442 Atrioventricular block, complete: Secondary | ICD-10-CM

## 2019-10-26 LAB — CUP PACEART REMOTE DEVICE CHECK
Battery Remaining Longevity: 125 mo
Battery Remaining Percentage: 95.5 %
Battery Voltage: 2.96 V
Brady Statistic AP VP Percent: 9.8 %
Brady Statistic AP VS Percent: 1 %
Brady Statistic AS VP Percent: 90 %
Brady Statistic AS VS Percent: 1 %
Brady Statistic RA Percent Paced: 9.6 %
Brady Statistic RV Percent Paced: 99 %
Date Time Interrogation Session: 20210831164105
Implantable Lead Implant Date: 20170323
Implantable Lead Implant Date: 20170323
Implantable Lead Location: 753859
Implantable Lead Location: 753860
Implantable Pulse Generator Implant Date: 20170323
Lead Channel Impedance Value: 450 Ohm
Lead Channel Impedance Value: 610 Ohm
Lead Channel Pacing Threshold Amplitude: 0.625 V
Lead Channel Pacing Threshold Amplitude: 0.75 V
Lead Channel Pacing Threshold Pulse Width: 0.4 ms
Lead Channel Pacing Threshold Pulse Width: 0.4 ms
Lead Channel Sensing Intrinsic Amplitude: 12 mV
Lead Channel Sensing Intrinsic Amplitude: 3.1 mV
Lead Channel Setting Pacing Amplitude: 1 V
Lead Channel Setting Pacing Amplitude: 1.625
Lead Channel Setting Pacing Pulse Width: 0.4 ms
Lead Channel Setting Sensing Sensitivity: 4 mV
Pulse Gen Model: 2272
Pulse Gen Serial Number: 3134337

## 2019-10-26 NOTE — Progress Notes (Signed)
Remote pacemaker transmission.   

## 2019-12-19 ENCOUNTER — Other Ambulatory Visit: Payer: Self-pay

## 2019-12-19 ENCOUNTER — Ambulatory Visit (INDEPENDENT_AMBULATORY_CARE_PROVIDER_SITE_OTHER): Payer: BC Managed Care – PPO

## 2019-12-19 DIAGNOSIS — Z23 Encounter for immunization: Secondary | ICD-10-CM | POA: Diagnosis not present

## 2020-01-24 ENCOUNTER — Ambulatory Visit (INDEPENDENT_AMBULATORY_CARE_PROVIDER_SITE_OTHER): Payer: BC Managed Care – PPO

## 2020-01-24 DIAGNOSIS — I442 Atrioventricular block, complete: Secondary | ICD-10-CM

## 2020-01-25 LAB — CUP PACEART REMOTE DEVICE CHECK
Battery Remaining Longevity: 124 mo
Battery Remaining Percentage: 95.5 %
Battery Voltage: 2.96 V
Brady Statistic AP VP Percent: 11 %
Brady Statistic AP VS Percent: 1 %
Brady Statistic AS VP Percent: 89 %
Brady Statistic AS VS Percent: 1 %
Brady Statistic RA Percent Paced: 10 %
Brady Statistic RV Percent Paced: 99 %
Date Time Interrogation Session: 20211130162331
Implantable Lead Implant Date: 20170323
Implantable Lead Implant Date: 20170323
Implantable Lead Location: 753859
Implantable Lead Location: 753860
Implantable Pulse Generator Implant Date: 20170323
Lead Channel Impedance Value: 480 Ohm
Lead Channel Impedance Value: 550 Ohm
Lead Channel Pacing Threshold Amplitude: 0.625 V
Lead Channel Pacing Threshold Amplitude: 0.625 V
Lead Channel Pacing Threshold Pulse Width: 0.4 ms
Lead Channel Pacing Threshold Pulse Width: 0.4 ms
Lead Channel Sensing Intrinsic Amplitude: 12 mV
Lead Channel Sensing Intrinsic Amplitude: 3.1 mV
Lead Channel Setting Pacing Amplitude: 0.875
Lead Channel Setting Pacing Amplitude: 1.625
Lead Channel Setting Pacing Pulse Width: 0.4 ms
Lead Channel Setting Sensing Sensitivity: 4 mV
Pulse Gen Model: 2272
Pulse Gen Serial Number: 3134337

## 2020-01-30 NOTE — Progress Notes (Signed)
Remote pacemaker transmission.   

## 2020-02-27 ENCOUNTER — Other Ambulatory Visit: Payer: Self-pay

## 2020-02-27 ENCOUNTER — Telehealth (HOSPITAL_COMMUNITY): Payer: BC Managed Care – PPO | Admitting: Psychiatry

## 2020-02-28 ENCOUNTER — Emergency Department (HOSPITAL_COMMUNITY): Payer: BC Managed Care – PPO

## 2020-02-28 ENCOUNTER — Other Ambulatory Visit: Payer: Self-pay

## 2020-02-28 ENCOUNTER — Emergency Department (HOSPITAL_COMMUNITY)
Admission: EM | Admit: 2020-02-28 | Discharge: 2020-02-28 | Disposition: A | Discharge status: Home / Self Care | Payer: BC Managed Care – PPO | Attending: Emergency Medicine | Admitting: Emergency Medicine

## 2020-02-28 ENCOUNTER — Encounter (HOSPITAL_COMMUNITY): Payer: Self-pay | Admitting: *Deleted

## 2020-02-28 DIAGNOSIS — M25512 Pain in left shoulder: Secondary | ICD-10-CM | POA: Insufficient documentation

## 2020-02-28 MED ORDER — ACETAMINOPHEN 325 MG PO TABS
650.0000 mg | ORAL_TABLET | Freq: Once | ORAL | Status: AC
  Administered 2020-02-28: 650 mg via ORAL
  Filled 2020-02-28: qty 2

## 2020-02-28 NOTE — Discharge Instructions (Addendum)
Today your x-rays were reassuring.  Your ultrasound did not show any evidence of a blood clot in your arm.  Please take Tylenol (acetaminophen) to relieve your pain.  You may take tylenol, up to 1,000 mg (two extra strength pills).  Do not take more than 3,000 mg tylenol in a 24 hour period.  Please check all medication labels as many medications such as pain and cold medications may contain tylenol. Please do not drink alcohol while taking this medication.

## 2020-02-28 NOTE — ED Triage Notes (Signed)
Left shoulder pain onset yesterday, no known injury

## 2020-02-28 NOTE — ED Notes (Signed)
Pt to [place] via [place] staff at this time.

## 2020-02-28 NOTE — ED Notes (Addendum)
Entered room and introduced self to patient. Pt appears to be resting in bed, respirations are even and unlabored with equal chest rise and fall. Bed is locked in the lowest position, side rails x2, call bell within reach. Pt educated on call light use and hourly rounding, verbalized understanding and in agreement at this time. All questions and concerns voiced addressed. Refreshments offered and provided per patient request. Cardiac monitor in place. Will continue to monitor.

## 2020-02-28 NOTE — ED Provider Notes (Signed)
[facility] EMERGENCY DEPARTMENT Provider Note   CSN: [id number] Arrival date & time: 02/28/20  1129     History Chief Complaint  Patient presents with  . Shoulder Pain    Alejandra Harrison is a 60 y.o. 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  She reports that her pain has been going on over the past few days, she is unable to further define the onset of her pain however notes that it got significantly worse last night into today. She denies any weakness or fevers.  She states that she did crochet for an extended period however that is not new for her. She has tried ice with out relief.  She denies any recent injections.  No history of septic arthritis and aside from the shoulder hurting she feels normal.  She denies specific chest pain or shortness of breath.  She denies any trauma, no falls.  No numbness or tingling in her left arm.  She denies any neck pain.  She does note that the fingers in her left hand have been swelling.  She does not take blood thinning medications.   HPI     Past Medical History:  Diagnosis Date  . Anxiety   . AV block, complete (HCC)   . History of seasonal allergies   . Psychosis [facility])     Patient Active Problem List   Diagnosis Date Noted  . Atrophic vaginitis 08/12/2018  . Vitamin B 12 deficiency 06/09/2017  . Morbid obesity (HCC) 06/08/2017  . History of colonic polyps 12/20/2015  . GERD (gastroesophageal reflux disease) 12/20/2015  . Constipation 09/06/2015  . Rectal bleeding 09/06/2015  . Heart block 05/17/2015  . CHB (complete heart block) (HCC)   . Bradycardia 05/07/2015  . Vitamin D deficiency 03/15/2015  . Post-menopause 08/29/2013  . Insomnia due to mental disorder 04/02/2012  . Psychosis (HCC) 08/29/2011    Past Surgical History:  Procedure Laterality Date  . COLONOSCOPY N/A 09/28/2015   Procedure: COLONOSCOPY;  Surgeon: West Bali,  MD;  Location: [facility];  Service: Endoscopy;  Laterality: N/A;  1030  . EP IMPLANTABLE DEVICE N/A 05/17/2015   Procedure: Pacemaker Implant;  Surgeon: Will Jorja Loa, MD;  Location: [facility] INVASIVE CV LAB;  Service: Cardiovascular;  Laterality: N/A;  . INSERT / REPLACE / REMOVE PACEMAKER  05/17/2015  . TUBAL LIGATION       OB History    Gravida  2   Para  2   Term  2   Preterm      AB      Living  2     SAB      IAB      Ectopic      Multiple      Live Births              Family History  Problem Relation Age of Onset  . Dementia Mother   . Diabetes Father   . Breast cancer Paternal Aunt   . ADD / ADHD Neg Hx   . Alcohol abuse Neg Hx   . Drug abuse Neg Hx   . Anxiety disorder Neg Hx   . Bipolar disorder Neg Hx   . Depression Neg Hx   . OCD Neg Hx   . Paranoid behavior Neg Hx   . Schizophrenia Neg Hx   . Seizures Neg Hx   . Sexual abuse  Neg Hx   . Physical abuse Neg Hx   . Colon cancer Neg Hx     Social History   Tobacco Use  . Smoking status: Never Smoker  . Smokeless tobacco: Never Used  . Tobacco comment: Never smoked  Vaping Use  . Vaping Use: Never used  Substance Use Topics  . Alcohol use: No    Alcohol/week: 0.0 standard drinks  . Drug use: No    Home Medications Prior to Admission medications   Medication Sig Start Date End Date Taking? Authorizing Provider  ARIPiprazole (ABILIFY) 2 MG tablet Take 0.5 tablets (1 mg total) by mouth daily. 08/31/19   Cloria Spring, MD  metoprolol tartrate (LOPRESSOR) 50 MG tablet Take 1 tablet by mouth twice daily 08/05/19   Camnitz, Ocie Doyne, MD  mupirocin ointment (BACTROBAN) 2 % Apply 1 application topically 2 (two) times daily. 08/02/19   Sharion Balloon, FNP  Vitamin D, Ergocalciferol, (DRISDOL) 1.25 MG (50000 UNIT) CAPS capsule Take 1 capsule (50,000 Units total) by mouth once a week. 08/24/19   Sharion Balloon, FNP    Allergies    Oxycodone-acetaminophen and Penicillins  Review of  Systems   Review of Systems  Constitutional: Negative for chills and fever.  Respiratory: Negative for shortness of breath.   Cardiovascular: Negative for chest pain.  Gastrointestinal: Negative for abdominal pain.  Musculoskeletal: Negative for back pain and neck pain.  Skin: Negative for color change.  Neurological: Negative for weakness, numbness and headaches.  Psychiatric/Behavioral: Negative for confusion.  All other systems reviewed and are negative.   Physical Exam Updated Vital Signs BP (!) 142/82 (BP Location: Right Arm)   Pulse 71   Temp 98.7 F (37.1 C) (Oral)   Resp 16   SpO2 98%   Physical Exam Vitals and nursing note reviewed.  Constitutional:      General: [name] not in acute distress.    Appearance: [name] not diaphoretic.  HENT:     Head: Normocephalic and atraumatic.  Eyes:     General: No scleral icterus.       Right eye: No discharge.        Left eye: No discharge.     Conjunctiva/sclera: Conjunctivae normal.  Cardiovascular:     Rate and Rhythm: Normal rate and regular rhythm.     Pulses: Normal pulses.     Heart sounds: Normal heart sounds. No murmur heard.     Comments: 2+ left radial pulse Pulmonary:     Effort: Pulmonary effort is normal. No respiratory distress.     Breath sounds: Normal breath sounds. No stridor.  Chest:     Chest wall: No tenderness.  Abdominal:     General: There is no distension.     Tenderness: There is no abdominal tenderness.  Musculoskeletal:        General: No deformity.     Cervical back: Normal range of motion. No tenderness.     Comments: There is mild edema over the fingers diffusely on the left hand. No overt upper left upper extremity edema however this exam it is somewhat limited due to patient's body habitus.  There is pain with palpation over the lateral left shoulder and pain with range of motion of the left shoulder.  There is no evidence of compartment syndrome his compartments in the left upper  extremity are soft and easily compressible.  Skin:    General: Skin is warm and dry.     Comments: No visualized  erythema, edema, ecchymosis, rash or other skin abnormalities over the left shoulder and upper arm.  Neurological:     Mental Status: [name] alert.     Sensory: No sensory deficit (Sensation intact to light touch in LUE).     Motor: No abnormal muscle tone.  Psychiatric:        Behavior: Behavior normal.     ED Results / Procedures / Treatments   Labs (all labs ordered are listed, but only abnormal results are displayed) Labs Reviewed - No data to display  EKG EKG Interpretation  Date/Time:  Tuesday February 28 2020 14:41:10 EST Ventricular Rate:  69 PR Interval:    QRS Duration: 140 QT Interval:  450 QTC Calculation: 483 R Axis:   -158 Text Interpretation: av paced Prolonged PR interval Nonspecific intraventricular conduction delay Anterolateral infarct, old Confirmed by Meridee Score [phone]) on 02/28/2020 3:07:45 PM   Radiology DG Chest 2 View  Result Date: 02/28/2020 CLINICAL DATA:  Left shoulder pain. EXAM: CHEST - 2 VIEW COMPARISON:  May 18, 2015. FINDINGS: The heart size and mediastinal contours are within normal limits. Both lungs are clear. Left-sided pacemaker is unchanged in position. No pneumothorax or pleural effusion is noted. The visualized skeletal structures are unremarkable. IMPRESSION: No active cardiopulmonary disease. Electronically Signed   By: Lupita Raider M.D.   On: 02/28/2020 15:25   US Venous Img Upper Left (DVT Study)  Result Date: 02/28/2020 CLINICAL DATA:  60 year old female with edema left upper extremity EXAM: LEFT UPPER EXTREMITY VENOUS DOPPLER ULTRASOUND TECHNIQUE: Gray-scale sonography with graded compression, as well as color Doppler and duplex ultrasound were performed to evaluate the upper extremity deep venous system from the level of the subclavian vein and including the jugular, axillary, basilic, radial, ulnar and upper  cephalic vein. Spectral Doppler was utilized to evaluate flow at rest and with distal augmentation maneuvers. COMPARISON:  None. FINDINGS: Contralateral Subclavian Vein: Respiratory phasicity is normal and symmetric with the symptomatic side. No evidence of thrombus. Normal compressibility. Internal Jugular Vein: No evidence of thrombus. Normal compressibility, respiratory phasicity and response to augmentation. Subclavian Vein: No evidence of thrombus. Normal compressibility, respiratory phasicity and response to augmentation. Axillary Vein: No evidence of thrombus. Normal compressibility, respiratory phasicity and response to augmentation. Cephalic Vein: No evidence of thrombus. Normal compressibility, respiratory phasicity and response to augmentation. Basilic Vein: No evidence of thrombus. Normal compressibility, respiratory phasicity and response to augmentation. Brachial Veins: No evidence of thrombus. Normal compressibility, respiratory phasicity and response to augmentation. Radial Veins: No evidence of thrombus. Normal compressibility, respiratory phasicity and response to augmentation. Ulnar Veins: No evidence of thrombus. Normal compressibility, respiratory phasicity and response to augmentation. Other Findings:  None visualized. IMPRESSION: Sonographic survey of the left upper extremity negative for DVT. Electronically Signed   By: Gilmer Mor D.O.   On: 02/28/2020 16:09   DG Shoulder Left  Result Date: 02/28/2020 CLINICAL DATA:  Acute left shoulder pain without known injury. EXAM: LEFT SHOULDER - 2+ VIEW COMPARISON:  None. FINDINGS: There is no evidence of fracture or dislocation. There is no evidence of arthropathy or other focal bone abnormality. Soft tissues are unremarkable. IMPRESSION: Negative. Electronically Signed   By: Lupita Raider M.D.   On: 02/28/2020 15:24    Procedures Procedures (including critical care time)  Medications Ordered in ED Medications  acetaminophen (TYLENOL)  tablet 650 mg (650 mg Oral Given 02/28/20 1535)    ED Course  I have reviewed the triage vital signs and the  nursing notes.  Pertinent labs & imaging results that were available during my care of the patient were reviewed by me and considered in my medical decision making (see chart for details).  Clinical Course as of 02/28/20 1748  Tue Feb 28, 2020  [id number] 60 year old female complaining of left shoulder pain.  She said it has been going on and off for a while but since last evening is gotten worse.  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  No numbness.  Pain keeps her from ranging.  Distal pulses intact.  Passive range of motion with some pain but not significant.  Has increased pain with active motion.  X-rays and DVT study unremarkable.  Likely needs orthopedic follow-up and possible injection or further imaging. [MB]    Clinical Course User Index [MB] Terrilee Files, MD   MDM Rules/Calculators/A&P                         Patient is a 60 year old woman who presents today for evaluation of atraumatic left shoulder pain. She does have a history of complete heart block with pacemaker in place and given the atraumatic nature differential includes referred cardiac/chest pain. EKG is obtained without ischemia or acute abnormality.  Chest x-ray does not show consolidation, pneumothorax or other cause for her symptoms. X-rays of the left shoulder were obtained without fracture, dislocation or other acute abnormality.  Given the edema into her fingers upper extremity DVT study is obtained without evidence of DVT.  Patient's pain is significantly worsened with range of motion of the left shoulder, given clinical exam consistent with MSK pain, along with reassuring EKG and chest x-ray and vital signs doubt referred pain from intrathoracic cause.  Septic arthritis is considered, however patient has increased range of motion when she is relaxed and not using her muscles, in addition to  her otherwise feeling well and lack of clear source for infection doubt septic arthritis.  Suspect inflammation/MSK irritation possibly due to her increased crochet activity.    This patient was seen as a shared visit with Dr. Charm Barges.  [name] neurovascularly intact on my exam.  Recommended outpatient orthopedics follow-up.  Tylenol as needed for pain.  Return precautions were discussed with patient who states their understanding.  At the time of discharge patient denied any unaddressed complaints or concerns.  Patient is agreeable for discharge home.  Note: Portions of this report may have been transcribed using voice recognition software. Every effort was made to ensure accuracy; however, inadvertent computerized transcription errors may be present  Final Clinical Impression(s) / ED Diagnoses Final diagnoses:  Acute pain of left shoulder    Rx / DC Orders ED Discharge Orders    None       Cristina Gong, PA-C 02/28/20 1751    Terrilee Files, MD 02/29/20 1043

## 2020-03-06 ENCOUNTER — Other Ambulatory Visit: Payer: Self-pay

## 2020-03-06 ENCOUNTER — Encounter: Payer: Self-pay | Admitting: Orthopedic Surgery

## 2020-03-06 ENCOUNTER — Telehealth (INDEPENDENT_AMBULATORY_CARE_PROVIDER_SITE_OTHER): Payer: BC Managed Care – PPO | Admitting: Psychiatry

## 2020-03-06 ENCOUNTER — Ambulatory Visit: Payer: BC Managed Care – PPO | Admitting: Orthopedic Surgery

## 2020-03-06 ENCOUNTER — Ambulatory Visit: Payer: BC Managed Care – PPO

## 2020-03-06 ENCOUNTER — Encounter (HOSPITAL_COMMUNITY): Payer: Self-pay | Admitting: Psychiatry

## 2020-03-06 VITALS — Ht 64.75 in | Wt 246.0 lb

## 2020-03-06 DIAGNOSIS — S46012A Strain of muscle(s) and tendon(s) of the rotator cuff of left shoulder, initial encounter: Secondary | ICD-10-CM

## 2020-03-06 DIAGNOSIS — F23 Brief psychotic disorder: Secondary | ICD-10-CM

## 2020-03-06 DIAGNOSIS — M25512 Pain in left shoulder: Secondary | ICD-10-CM | POA: Diagnosis not present

## 2020-03-06 MED ORDER — CYCLOBENZAPRINE HCL 10 MG PO TABS: 10.0000 mg | ORAL_TABLET | Freq: Every day | ORAL | 1 refills | Status: DC

## 2020-03-06 MED ORDER — ARIPIPRAZOLE 2 MG PO TABS: 1.0000 mg | ORAL_TABLET | Freq: Every day | ORAL | 5 refills | Status: DC

## 2020-03-06 NOTE — Patient Instructions (Signed)
Note for work  Shoulder exercises  Return if not getting better - consider an injection

## 2020-03-06 NOTE — Progress Notes (Signed)
New Patient Visit  Assessment: Alejandra Harrison is a 60 y.o. female with the following: Left shoulder rotator cuff strain  Plan: [name] has left shoulder pain without recent injury.  Since the onset of her pain, her symptoms have improved.  On physical exam today, she has near full range of motion and excellent strength.  I am less concerned about a rotator cuff tear, so do not consider an MRI at this time.  I have encouraged her to continue taking ibuprofen as needed.  I provided her with some shoulder exercises that she can work on at home.  If her symptoms worsen with these activities, we could also consider an injection in the future.  Due to the description of her pain and spasming around her shoulder girdle, I did provide her with a limited prescription of Flexeril.  Follow-up: Return if symptoms worsen or fail to improve.  Subjective:  Chief Complaint  Patient presents with  . Shoulder Pain    Patient reports her shoulder started hurting around 02/22/2020. Pt reports a burning pain, and feels like she has trouble lifting,     History of Present Illness: Alejandra Harrison is a 60 y.o. RHD female who presents for evaluation of her left shoulder.  She has had pain in the shoulder for the last 1-2 weeks.  She denies a specific injury.  However, she does note a slight increase in activity, including laying in a recliner and crocheting.  She has been taking ibuprofen as needed, which does help with some of her symptoms.  [name] also relying on the warm water in the shower, as well as ice to improve her symptoms.  Since the onset of her pain, it has improved, as has her function.  No previous injury to her left shoulder.   Review of Systems: No fevers or chills No numbness or tingling No chest pain No shortness of breath No bowel or bladder dysfunction No GI distress No headaches   Medical History:  Past Medical History:  Diagnosis Date  . Anxiety   . AV block, complete (HCC)   .  History of seasonal allergies   . Psychosis [facility])     Past Surgical History:  Procedure Laterality Date  . COLONOSCOPY N/A 09/28/2015   Procedure: COLONOSCOPY;  Surgeon: West Bali, MD;  Location: [facility];  Service: Endoscopy;  Laterality: N/A;  1030  . EP IMPLANTABLE DEVICE N/A 05/17/2015   Procedure: Pacemaker Implant;  Surgeon: Will Jorja Loa, MD;  Location: [facility] INVASIVE CV LAB;  Service: Cardiovascular;  Laterality: N/A;  . INSERT / REPLACE / REMOVE PACEMAKER  05/17/2015  . TUBAL LIGATION      Family History  Problem Relation Age of Onset  . Dementia Mother   . Diabetes Father   . Breast cancer Paternal Aunt   . ADD / ADHD Neg Hx   . Alcohol abuse Neg Hx   . Drug abuse Neg Hx   . Anxiety disorder Neg Hx   . Bipolar disorder Neg Hx   . Depression Neg Hx   . OCD Neg Hx   . Paranoid behavior Neg Hx   . Schizophrenia Neg Hx   . Seizures Neg Hx   . Sexual abuse Neg Hx   . Physical abuse Neg Hx   . Colon cancer Neg Hx    Social History   Tobacco Use  . Smoking status: Never Smoker  . Smokeless tobacco: Never Used  . Tobacco comment: Never smoked  Vaping Use  . Vaping Use: Never used  Substance Use Topics  . Alcohol use: No    Alcohol/week: 0.0 standard drinks  . Drug use: No    Allergies  Allergen Reactions  . Oxycodone-Acetaminophen Hives and Other (See Comments)    Hands peeled  . Penicillins Hives and Other (See Comments)    Hands peeled    Current Meds  Medication Sig  . ARIPiprazole (ABILIFY) 2 MG tablet Take 0.5 tablets (1 mg total) by mouth daily.  . cyclobenzaprine (FLEXERIL) 10 MG tablet Take 1 tablet (10 mg total) by mouth at bedtime.  . metoprolol tartrate (LOPRESSOR) 50 MG tablet Take 1 tablet by mouth twice daily  . mupirocin ointment (BACTROBAN) 2 % Apply 1 application topically 2 (two) times daily.  . Vitamin D, Ergocalciferol, (DRISDOL) 1.25 MG (50000 UNIT) CAPS capsule Take 1 capsule (50,000 Units total) by mouth once a week.     Objective: Ht 5' 4.75" (1.645 m)   Wt 246 lb (111.6 kg)   BMI 41.25 kg/m   Physical Exam:  General: Alert and oriented, no acute distress Gait: Normal  Evaluation of left shoulder demonstrates normal muscle bulk.  There is no deformity appreciated.  She has near full range of motion the left shoulder compared to the right shoulder.  4+/5 strength with some discomfort with strength testing.  Negative belly press.  Mildly positive impingement testing.  Some pain with passive stretch to the 90/90 position.    IMAGING: I personally ordered and reviewed the following images  X-ray of the left shoulder demonstrates no acute injury.  There is no evidence of proximal humeral migration.  Well-maintained glenohumeral joint space.  Impression: Normal left shoulder.  New Medications:  Meds ordered this encounter  Medications  . cyclobenzaprine (FLEXERIL) 10 MG tablet    Sig: Take 1 tablet (10 mg total) by mouth at bedtime.    Dispense:  15 tablet    Refill:  1      Oliver Barre, MD  03/06/2020 4:30 PM

## 2020-03-06 NOTE — Progress Notes (Signed)
Virtual Visit via Telephone Note  I connected with Alejandra Harrison on 03/06/20 at  9:00 AM EST by telephone and verified that I am speaking with the correct person using two identifiers.  Location: Patient: home Provider: office   I discussed the limitations, risks, security and privacy concerns of performing an evaluation and management service by telephone and the availability of in person appointments. I also discussed with the patient that there may be a patient responsible charge related to this service. The patient expressed understanding and agreed to proceed.    I discussed the assessment and treatment plan with the patient. The patient was provided an opportunity to ask questions and all were answered. The patient agreed with the plan and demonstrated an understanding of the instructions.   The patient was advised to call back or seek an in-person evaluation if the symptoms worsen or if the condition fails to improve as anticipated.  I provided 15 minutes of non-face-to-face time during this encounter.   [name], MD  [facility] MD/PA/NP OP Progress Note  03/06/2020 9:24 AM Alejandra Harrison  MRN:  [id number]  Chief Complaint:  Chief Complaint    Paranoid; Follow-up; Hallucinations     HPI: Thispatient is a 60 year old married white female who lives with her husband and 2 sonsin [place]. 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  The patient states that in 2012 she started seeing a figure that she thought may have been God. She also got fixated on a man in her Sunday school and thought that her husband would die and she would have to marry this man. She was having lots of religious visions. She claims that she had not been on any medication or using drugs or alcohol at that time. She had not had a head injury or any additional stress. She may of been going through menopause. She denied visual hallucinations but claims she felt "high and on top of the world."  She's never had a history of depression however.  At any rate she was seen here by Dr. Toni Arthurs and started on Abilify. She was on a higher dose such as 5 mg in the past and it caused akathisia. Currently she takes 1 mg per day and it seems to be working fairly well. She's no longer having visions or unusual thoughts and she's functioning well in her job. She's not having significant side effects  The patient returns for follow-up after 6 months.  She states that she has been doing well but recently her left shoulder began to hurt quite badly.  She was seen in the emergency room last week had a normal EKG and normal x-ray.  It was thought to be inflammation possibly from her crocheting hobby.  [name] seeing an orthopedic today.  She seems to be quite worried about it.  She has not had any further delusions or paranoid thoughts or hallucinations.  [name] sleeping well.  [name] out of work right now because of her shoulder but would like to return to work as soon as possible. Visit Diagnosis:    ICD-10-CM   1. Brief psychotic disorder (HCC)  F23     Past Psychiatric History: none  Past Medical History:  Past Medical History:  Diagnosis Date  . Anxiety   . AV block, complete (HCC)   . History of seasonal allergies   . Psychosis [facility])     Past Surgical History:  Procedure Laterality Date  .  COLONOSCOPY N/A 09/28/2015   Procedure: COLONOSCOPY;  Surgeon: West Bali, MD;  [facility];  Service: Endoscopy;  Laterality: N/A;  1030  . EP IMPLANTABLE DEVICE N/A 05/17/2015   Procedure: Pacemaker Implant;  Surgeon: Will Jorja Loa, MD;  [facility]: [facility] INV[facility];  Service: Cardiovascular;  Laterality: N/A;  . INSERT / REPLACE / REMOVE PACEMAKER  05/17/2015  . TUBAL LIGATION      Family Psychiatric History: see below  Family History:  Family History  Problem Relation Age of Onset  . Dementia Mother   . Diabetes Father   . Breast cancer Paternal Aunt   . ADD / ADHD Neg Hx    . Alcohol abuse Neg Hx   . Drug abuse Neg Hx   . Anxiety disorder Neg Hx   . Bipolar disorder Neg Hx   . Depression Neg Hx   . OCD Neg Hx   . Paranoid behavior Neg Hx   . Schizophrenia Neg Hx   . Seizures Neg Hx   . Sexual abuse Neg Hx   . Physical abuse Neg Hx   . Colon cancer Neg Hx     Social History:  Social History   Socioeconomic History  . Marital status: Married    Spouse name: Not on file  . Number of children: Not on file  . Years of education: Not on file  . Highest education level: Not on file  Occupational History  . Not on file  Tobacco Use  . Smoking status: Never Smoker  . Smokeless tobacco: Never Used  . Tobacco comment: Never smoked  Vaping Use  . Vaping Use: Never used  Substance and Sexual Activity  . Alcohol use: No    Alcohol/week: 0.0 standard drinks  . Drug use: No  . Sexual activity: Not Currently    Birth control/protection: Post-menopausal  Other Topics Concern  . Not on file  Social History Narrative  . Not on file   Social Determinants of Health   Financial Resource Strain: Not on file  Food Insecurity: Not on file  Transportation Needs: Not on file  Physical Activity: Not on file  Stress: Not on file  Social Connections: Not on file    Allergies:  Allergies  Allergen Reactions  . Oxycodone-Acetaminophen Hives and Other (See Comments)    Hands peeled  . Penicillins Hives and Other (See Comments)    Hands peeled    Metabolic Disorder Labs: No results found for: HGBA1C, MPG No results found for: PROLACTIN Lab Results  Component Value Date   CHOL 165 08/24/2019   TRIG 116 08/24/2019   HDL 48 08/24/2019   CHOLHDL 3.4 08/24/2019   LDLCALC 96 08/24/2019   LDLCALC 100 (H) 08/12/2018   Lab Results  Component Value Date   TSH 2.610 08/24/2019   TSH 2.270 08/12/2018    Therapeutic Level Labs: No results found for: LITHIUM No results found for: VALPROATE No components found for:  CBMZ  Current  Medications: Current Outpatient Medications  Medication Sig Dispense Refill  . ARIPiprazole (ABILIFY) 2 MG tablet Take 0.5 tablets (1 mg total) by mouth daily. 30 tablet 5  . metoprolol tartrate (LOPRESSOR) 50 MG tablet Take 1 tablet by mouth twice daily 180 tablet 3  . mupirocin ointment (BACTROBAN) 2 % Apply 1 application topically 2 (two) times daily. 22 g 0  . Vitamin D, Ergocalciferol, (DRISDOL) 1.25 MG (50000 UNIT) CAPS capsule Take 1 capsule (50,000 Units total) by mouth once a week. 12  capsule 3   No current facility-administered medications for this visit.     Musculoskeletal: Strength & Muscle Tone: within normal limits Gait & Station: normal Patient leans: N/A  Psychiatric Specialty Exam: Review of Systems  Musculoskeletal: Positive for arthralgias and myalgias.  All other systems reviewed and are negative.   There were no vitals taken for this visit.There is no height or weight on file to calculate BMI.  General Appearance: NA  Eye Contact:  NA  Speech:  Clear and Coherent  Volume:  Normal  Mood:  Euthymic  Affect:  NA  Thought Process:  Goal Directed  Orientation:  Full (Time, Place, and Person)  Thought Content: Rumination   Suicidal Thoughts:  No  Homicidal Thoughts:  No  Memory:  Immediate;   Good Recent;   Good Remote;   Fair  Judgement:  Good  Insight:  Fair  Psychomotor Activity:  Normal  Concentration:  Concentration: Good and Attention Span: Good  Recall:  Good  Fund of Knowledge: Good  Language: Good  Akathisia:  No  Handed:  Right  AIMS (if indicated): not done  Assets:  Communication Skills Desire for Improvement Physical Health Resilience Social Support Talents/Skills  ADL's:  Intact  Cognition: WNL  Sleep:  Good   Screenings: PHQ2-9   Flowsheet Row Office Visit from 08/24/2019 in [facility] Office Visit from 08/02/2019 in [facility] Office Visit from 08/12/2018 in [facility] Office Visit from 02/26/2018 in [facility] Office Visit from 02/18/2018 in [facility]  PHQ-2 Total Score 0 0 0 0 0       Assessment and Plan: This patient is a 60 year old female with a psychotic episode in the past.  She has been maintained on Abilify 1 mg daily without relapse or side effect.  She will continue this dosage and return to see me in 69-months   [name], MD 03/06/2020, 9:24 AM

## 2020-04-24 ENCOUNTER — Ambulatory Visit (INDEPENDENT_AMBULATORY_CARE_PROVIDER_SITE_OTHER): Payer: BC Managed Care – PPO

## 2020-04-24 DIAGNOSIS — I442 Atrioventricular block, complete: Secondary | ICD-10-CM | POA: Diagnosis not present

## 2020-04-24 LAB — CUP PACEART REMOTE DEVICE CHECK
Battery Remaining Longevity: 125 mo
Battery Remaining Percentage: 95.5 %
Battery Voltage: 2.96 V
Brady Statistic AP VP Percent: 9.5 %
Brady Statistic AP VS Percent: 1 %
Brady Statistic AS VP Percent: 90 %
Brady Statistic AS VS Percent: 1 %
Brady Statistic RA Percent Paced: 9.2 %
Brady Statistic RV Percent Paced: 99 %
Date Time Interrogation Session: 20220301021706
Implantable Lead Implant Date: 20170323
Implantable Lead Implant Date: 20170323
Implantable Lead Location: 753859
Implantable Lead Location: 753860
Implantable Pulse Generator Implant Date: 20170323
Lead Channel Impedance Value: 450 Ohm
Lead Channel Impedance Value: 610 Ohm
Lead Channel Pacing Threshold Amplitude: 0.5 V
Lead Channel Pacing Threshold Amplitude: 0.875 V
Lead Channel Pacing Threshold Pulse Width: 0.4 ms
Lead Channel Pacing Threshold Pulse Width: 0.4 ms
Lead Channel Sensing Intrinsic Amplitude: 12 mV
Lead Channel Sensing Intrinsic Amplitude: 4.2 mV
Lead Channel Setting Pacing Amplitude: 1.125
Lead Channel Setting Pacing Amplitude: 1.5 V
Lead Channel Setting Pacing Pulse Width: 0.4 ms
Lead Channel Setting Sensing Sensitivity: 4 mV
Pulse Gen Model: 2272
Pulse Gen Serial Number: 3134337

## 2020-05-02 NOTE — Progress Notes (Signed)
Remote pacemaker transmission.   

## 2020-06-01 ENCOUNTER — Other Ambulatory Visit: Payer: Self-pay

## 2020-06-01 ENCOUNTER — Ambulatory Visit: Payer: BC Managed Care – PPO | Admitting: Nurse Practitioner

## 2020-06-01 ENCOUNTER — Encounter: Payer: Self-pay | Admitting: Nurse Practitioner

## 2020-06-01 VITALS — BP 130/87 | HR 62 | Temp 97.8°F | Resp 20 | Ht 64.0 in | Wt 246.0 lb

## 2020-06-01 DIAGNOSIS — N898 Other specified noninflammatory disorders of vagina: Secondary | ICD-10-CM | POA: Diagnosis not present

## 2020-06-01 MED ORDER — FLUCONAZOLE 150 MG PO TABS: 150.0000 mg | ORAL_TABLET | Freq: Once | ORAL | 0 refills | Status: AC

## 2020-06-01 MED ORDER — NYSTATIN 100000 UNIT/GM EX CREA: 1.0000 "application " | TOPICAL_CREAM | Freq: Two times a day (BID) | CUTANEOUS | 0 refills | Status: DC

## 2020-06-01 NOTE — Patient Instructions (Signed)

## 2020-06-01 NOTE — Progress Notes (Signed)
   Subjective:    Patient ID: Alejandra Harrison, female    DOB: Nov 17, 1960, 60 y.o.   MRN: [id number]   Chief Complaint: vaginal irritation   HPI' Patient come sin to day c/o irritation around the outside of her vagina. Started out as an itching and is now a burning sensation. She has not noticed a vaginal discharge, she denies dysuria.  Review of Systems  Constitutional: Negative.   Respiratory: Negative.   Cardiovascular: Negative.   Genitourinary: Negative for dysuria, frequency, urgency, vaginal bleeding, vaginal discharge and vaginal pain.  All other systems reviewed and are negative.      Objective:   Physical Exam Vitals and nursing note reviewed.  Constitutional:      Appearance: Normal appearance.  Genitourinary:    Comments: Perineal area erythematous and raw appearing No vaginal discharge noted- no pelvic exam performed. Neurological:     Mental Status: [name] alert.    BP 130/87   Pulse 62   Temp 97.8 F (36.6 C) (Temporal)   Resp 20   Ht 5\' 4"  (1.626 m)   Wt 246 lb (111.6 kg)   SpO2 96%   BMI 42.23 kg/m         Assessment & Plan:  Alejandra Harrison in today with chief complaint of vaginal irritation   1. Vaginal irritation Warm baths Avoid scratching area Meds ordered this encounter  Medications  . fluconazole (DIFLUCAN) 150 MG tablet    Sig: Take 1 tablet (150 mg total) by mouth once for 1 dose.    Dispense:  1 tablet    Refill:  0    Order Specific Question:   Supervising Provider    Answer:   [name] A Arville Care  . nystatin cream (MYCOSTATIN)    Sig: Apply 1 application topically 2 (two) times daily.    Dispense:  30 g    Refill:  0    Order Specific Question:   Supervising Provider    Answer:   [id number] [[id number]]   RTO prn    The above assessment and management plan was discussed with the patient. The patient verbalized understanding of and has agreed to the management plan. Patient is aware to call the clinic if  symptoms persist or worsen. Patient is aware when to return to the clinic for a follow-up visit. Patient educated on when it is appropriate to go to the emergency department.   Mary-Margaret Arville Care, FNP

## 2020-07-20 ENCOUNTER — Other Ambulatory Visit: Payer: Self-pay | Admitting: Family

## 2020-07-20 DIAGNOSIS — Z1231 Encounter for screening mammogram for malignant neoplasm of breast: Secondary | ICD-10-CM

## 2020-07-22 ENCOUNTER — Other Ambulatory Visit: Payer: Self-pay | Admitting: Cardiology

## 2020-07-24 ENCOUNTER — Ambulatory Visit (INDEPENDENT_AMBULATORY_CARE_PROVIDER_SITE_OTHER): Payer: BC Managed Care – PPO

## 2020-07-24 ENCOUNTER — Other Ambulatory Visit: Payer: Self-pay

## 2020-07-24 DIAGNOSIS — I442 Atrioventricular block, complete: Secondary | ICD-10-CM | POA: Diagnosis not present

## 2020-07-24 MED ORDER — METOPROLOL TARTRATE 50 MG PO TABS: 50.0000 mg | ORAL_TABLET | Freq: Two times a day (BID) | ORAL | 0 refills | Status: DC

## 2020-07-25 LAB — CUP PACEART REMOTE DEVICE CHECK
Battery Remaining Longevity: 124 mo
Battery Remaining Percentage: 95.5 %
Battery Voltage: 2.96 V
Brady Statistic AP VP Percent: 11 %
Brady Statistic AP VS Percent: 1 %
Brady Statistic AS VP Percent: 89 %
Brady Statistic AS VS Percent: 1 %
Brady Statistic RA Percent Paced: 10 %
Brady Statistic RV Percent Paced: 99 %
Date Time Interrogation Session: 20220531085057
Implantable Lead Implant Date: 20170323
Implantable Lead Implant Date: 20170323
Implantable Lead Location: 753859
Implantable Lead Location: 753860
Implantable Pulse Generator Implant Date: 20170323
Lead Channel Impedance Value: 430 Ohm
Lead Channel Impedance Value: 590 Ohm
Lead Channel Pacing Threshold Amplitude: 0.5 V
Lead Channel Pacing Threshold Amplitude: 0.875 V
Lead Channel Pacing Threshold Pulse Width: 0.4 ms
Lead Channel Pacing Threshold Pulse Width: 0.4 ms
Lead Channel Sensing Intrinsic Amplitude: 12 mV
Lead Channel Sensing Intrinsic Amplitude: 3.3 mV
Lead Channel Setting Pacing Amplitude: 1.125
Lead Channel Setting Pacing Amplitude: 1.5 V
Lead Channel Setting Pacing Pulse Width: 0.4 ms
Lead Channel Setting Sensing Sensitivity: 4 mV
Pulse Gen Model: 2272
Pulse Gen Serial Number: 3134337

## 2020-08-03 ENCOUNTER — Other Ambulatory Visit: Payer: Self-pay

## 2020-08-03 ENCOUNTER — Ambulatory Visit: Payer: BC Managed Care – PPO | Admitting: Cardiology

## 2020-08-03 ENCOUNTER — Encounter: Payer: Self-pay | Admitting: Cardiology

## 2020-08-03 DIAGNOSIS — I442 Atrioventricular block, complete: Secondary | ICD-10-CM

## 2020-08-03 NOTE — Progress Notes (Signed)
Electrophysiology Office Note   Date:  08/03/2020   ID:  Alejandra Harrison, DOB May 15, 1960, MRN [id number]  PCP:  Junie Spencer, FNP  Primary Electrophysiologist:  Regan Lemming, MD    No chief complaint on file.    History of Present Illness: Alejandra Harrison is a 60 y.o. female who presents today for electrophysiology evaluation.     She has a history significant for complete heart block.  [name] now status post Saint Jude dual-chamber pacemaker implanted 05/17/2015.  She was noted to have nonsustained ventricular tachycardia on device interrogation and has since been put on metoprolol.  Today, denies symptoms of palpitations, chest pain, shortness of breath, orthopnea, PND, lower extremity edema, claudication, dizziness, presyncope, syncope, bleeding, or neurologic sequela. The patient is tolerating medications without difficulties.  Since last being seen she has done well.  [name] able to do all of her daily activities without restriction.  Past Medical History:  Diagnosis Date   Anxiety    AV block, complete (HCC)    History of seasonal allergies    Psychosis (HCC)    Past Surgical History:  Procedure Laterality Date   COLONOSCOPY N/A 09/28/2015   Procedure: COLONOSCOPY;  Surgeon: West Bali, MD;  [facility];  Service: Endoscopy;  Laterality: N/A;  1030   EP IMPLANTABLE DEVICE N/A 05/17/2015   Procedure: Pacemaker Implant;  Surgeon: Wong Steadham Jorja Loa, MD;  Location: [facility];  Service: Cardiovascular;  Laterality: N/A;   INSERT / REPLACE / REMOVE PACEMAKER  05/17/2015   TUBAL LIGATION       Current Outpatient Medications  Medication Sig Dispense Refill   ARIPiprazole (ABILIFY) 2 MG tablet Take 0.5 tablets (1 mg total) by mouth daily. 30 tablet 5   metoprolol tartrate (LOPRESSOR) 50 MG tablet Take 1 tablet (50 mg total) by mouth 2 (two) times daily. Please keep upcoming appt in June 2022 with Dr. Elberta Fortis before anymore refills. Thank you 180  tablet 0   nystatin cream (MYCOSTATIN) Apply 1 application topically 2 (two) times daily. 30 g 0   Vitamin D, Ergocalciferol, (DRISDOL) 1.25 MG (50000 UNIT) CAPS capsule Take 1 capsule (50,000 Units total) by mouth once a week. 12 capsule 3   No current facility-administered medications for this visit.    Allergies:   Oxycodone-acetaminophen and Penicillins   Social History:  The patient  reports that she has never smoked. She has never used smokeless tobacco. She reports that she does not drink alcohol and does not use drugs.   Family History:  The patient's family history includes Breast cancer in her paternal aunt; Dementia in her mother; Diabetes in her father.   ROS:  Please see the history of present illness.   Otherwise, review of systems is positive for none.   All other systems are reviewed and negative.   PHYSICAL EXAM: VS:  There were no vitals taken for this visit. , BMI There is no height or weight on file to calculate BMI. GEN: Well nourished, well developed, in no acute distress  HEENT: normal  Neck: no JVD, carotid bruits, or masses Cardiac: RRR; no murmurs, rubs, or gallops,no edema  Respiratory:  clear to auscultation bilaterally, normal work of breathing GI: soft, nontender, nondistended, + BS MS: no deformity or atrophy  Skin: warm and dry, device site well healed Neuro:  Strength and sensation are intact Psych: euthymic mood, full affect  EKG:  EKG is not ordered today. Personal review of  the ekg ordered 02/29/20 shows sinus rhythm V pacing  Personal review of the device interrogation today. Results in [facility]   Recent Labs: 08/24/2019: ALT 32; BUN 7; Creatinine, Ser 0.70; Hemoglobin 14.4; Platelets 296; Potassium 4.8; Sodium 139; TSH 2.610   Lipid Panel     Component Value Date/Time   CHOL 165 08/24/2019 0945   TRIG 116 08/24/2019 0945   HDL 48 08/24/2019 0945   CHOLHDL 3.4 08/24/2019 0945   LDLCALC 96 08/24/2019 0945   Wt Readings from Last 3  Encounters:  06/01/20 246 lb (111.6 kg)  03/06/20 246 lb (111.6 kg)  08/24/19 252 lb 12.8 oz (114.7 kg)    TTE 07/16/16 - Left ventricle: The cavity size was normal. Wall thickness was   increased increased in a pattern of mild to moderate LVH.   Systolic function was normal. The estimated ejection fraction was   in the range of 60% to 65%. Doppler parameters are consistent   with abnormal left ventricular relaxation (grade 1 diastolic   dysfunction). - Aortic valve: Mildly calcified annulus. Trileaflet; normal   thickness leaflets. Valve area (VTI): 2.55 cm^2. Valve area   (Vmax): 2.18 cm^2. Valve area (Vmean): 2.17 cm^2. - Technically adequate study.  ASSESSMENT AND PLAN:  1.  Complete heart block: Status post Saint Jude dual-chamber pacemaker implanted 05/17/2015.  Device functioning appropriately.  No changes at this time.    2.  Nonsustained ventricular tachycardia: Currently on metoprolol.  No further episodes.  No changes.    Current med no medicines are reviewed at length with the patient today.   The patient does not have concerns regarding her medicines.  The following changes were made today: none  Labs/ tests ordered today include:  No orders of the defined types were placed in this encounter.  Disposition:   FU with Alejandra Harrison 12 months  Signed, Jonni Oelkers Jorja Loa, MD  08/03/2020 11:24 AM     [facility] [address] [place] [address] [phone] (office) [phone] (fax)

## 2020-08-07 ENCOUNTER — Telehealth (HOSPITAL_COMMUNITY): Payer: Self-pay | Admitting: *Deleted

## 2020-08-07 NOTE — Telephone Encounter (Signed)
Patient is needing refills for all of her medications provider prescribes for her. Patient appt was cancelled due to provider being on medical leave. Message was left to call office to resch appt.  

## 2020-08-09 ENCOUNTER — Other Ambulatory Visit (HOSPITAL_COMMUNITY): Payer: Self-pay | Admitting: Psychiatry

## 2020-08-09 MED ORDER — ARIPIPRAZOLE 2 MG PO TABS: 1.0000 mg | ORAL_TABLET | Freq: Every day | ORAL | 5 refills | Status: DC

## 2020-08-09 NOTE — Telephone Encounter (Signed)
sent 

## 2020-08-10 ENCOUNTER — Ambulatory Visit: Payer: BC Managed Care – PPO | Admitting: Nurse Practitioner

## 2020-08-10 ENCOUNTER — Encounter: Payer: Self-pay | Admitting: Nurse Practitioner

## 2020-08-10 ENCOUNTER — Other Ambulatory Visit: Payer: Self-pay

## 2020-08-10 VITALS — BP 142/96 | HR 78 | Temp 97.9°F | Resp 20 | Ht 64.0 in | Wt 250.0 lb

## 2020-08-10 DIAGNOSIS — H60331 Swimmer's ear, right ear: Secondary | ICD-10-CM

## 2020-08-10 MED ORDER — CIPROFLOXACIN-DEXAMETHASONE 0.3-0.1 % OT SUSP: 4.0000 [drp] | Freq: Two times a day (BID) | OTIC | 0 refills | Status: DC

## 2020-08-10 MED ORDER — CEFDINIR 300 MG PO CAPS: 300.0000 mg | ORAL_CAPSULE | Freq: Two times a day (BID) | ORAL | 0 refills | Status: DC

## 2020-08-10 NOTE — Progress Notes (Addendum)
   Subjective:    Patient ID: Alejandra Harrison, female    DOB: 08-19-1960, 60 y.o.   MRN: [id number]   Chief Complaint: Ear Pain (Right ear/)   HPI Patient come sin c/o right ear pain. Started feeling irritated on MOnday and now it is hurting and her neck on right side feels swollen. She rates the pain 8/10. Eating increases pain. Advil has helped some with pain.    Review of Systems  Constitutional:  Negative for chills and fever.  HENT:  Positive for ear pain. Negative for ear discharge, facial swelling and hearing loss.   All other systems reviewed and are negative.     Objective:   Physical Exam Vitals and nursing note reviewed.  Constitutional:      Appearance: [name] obese.  HENT:     Head:     Comments: Cannot visualize TM on right- ear canal edematous.    Right Ear: There is impacted cerumen.     Left Ear: Ear canal normal. There is impacted cerumen.  Cardiovascular:     Rate and Rhythm: Normal rate and regular rhythm.     Heart sounds: Normal heart sounds.  Pulmonary:     Effort: Pulmonary effort is normal.     Breath sounds: Normal breath sounds.  Skin:    General: Skin is warm.  Neurological:     General: No focal deficit present.     Mental Status: [name] alert and oriented to person, place, and time.  Psychiatric:        Mood and Affect: Mood normal.        Behavior: Behavior normal.   BP (!) 142/96   Pulse 78   Temp 97.9 F (36.6 C) (Temporal)   Resp 20   Ht 5\' 4"  (1.626 m)   Wt 250 lb (113.4 kg)   SpO2 94%   BMI 42.91 kg/m   S/P bil ear canal irrigation- Left TM normal- unable to see right TM       Assessment & Plan:  Alejandra Harrison in today with chief complaint of Ear Pain (Right ear/)   1. Acute swimmer's ear of right side Avoid getting water in ear Do not stick q tip in ear RTO prn  Meds ordered this encounter  Medications   ciprofloxacin-dexamethasone (CIPRODEX) OTIC suspension    Sig: Place 4 drops into the right ear 2 (two) times  daily.    Dispense:  7.5 mL    Refill:  0    Order Specific Question:   Supervising Provider    Answer:   [name] A [[id number]]   cefdinir (OMNICEF) 300 MG capsule    Sig: Take 1 capsule (300 mg total) by mouth 2 (two) times daily. 1 po BID    Dispense:  20 capsule    Refill:  0    Order Specific Question:   Supervising Provider    Answer:   Arville Care A [[id number]]     The above assessment and management plan was discussed with the patient. The patient verbalized understanding of and has agreed to the management plan. Patient is aware to call the clinic if symptoms persist or worsen. Patient is aware when to return to the clinic for a follow-up visit. Patient educated on when it is appropriate to go to the emergency department.   Mary-Margaret Arville Care, FNP

## 2020-08-10 NOTE — Patient Instructions (Signed)

## 2020-08-15 NOTE — Progress Notes (Signed)
Remote pacemaker transmission.   

## 2020-08-28 ENCOUNTER — Encounter: Payer: Self-pay | Admitting: Family

## 2020-08-28 ENCOUNTER — Ambulatory Visit (INDEPENDENT_AMBULATORY_CARE_PROVIDER_SITE_OTHER): Payer: BC Managed Care – PPO | Admitting: Family

## 2020-08-28 ENCOUNTER — Other Ambulatory Visit: Payer: Self-pay

## 2020-08-28 VITALS — BP 138/83 | HR 64 | Temp 97.8°F | Ht 64.0 in | Wt 248.8 lb

## 2020-08-28 DIAGNOSIS — Z0001 Encounter for general adult medical examination with abnormal findings: Secondary | ICD-10-CM

## 2020-08-28 DIAGNOSIS — H938X1 Other specified disorders of right ear: Secondary | ICD-10-CM | POA: Diagnosis not present

## 2020-08-28 DIAGNOSIS — F5105 Insomnia due to other mental disorder: Secondary | ICD-10-CM

## 2020-08-28 DIAGNOSIS — Z0000 Encounter for general adult medical examination without abnormal findings: Secondary | ICD-10-CM

## 2020-08-28 DIAGNOSIS — K5900 Constipation, unspecified: Secondary | ICD-10-CM

## 2020-08-28 DIAGNOSIS — Z23 Encounter for immunization: Secondary | ICD-10-CM

## 2020-08-28 DIAGNOSIS — K219 Gastro-esophageal reflux disease without esophagitis: Secondary | ICD-10-CM

## 2020-08-28 DIAGNOSIS — I442 Atrioventricular block, complete: Secondary | ICD-10-CM

## 2020-08-28 DIAGNOSIS — E559 Vitamin D deficiency, unspecified: Secondary | ICD-10-CM

## 2020-08-28 DIAGNOSIS — J301 Allergic rhinitis due to pollen: Secondary | ICD-10-CM | POA: Diagnosis not present

## 2020-08-28 MED ORDER — ARIPIPRAZOLE 2 MG PO TABS: 1.0000 mg | ORAL_TABLET | Freq: Every day | ORAL | 5 refills | Status: DC

## 2020-08-28 MED ORDER — FLUTICASONE PROPIONATE 50 MCG/ACT NA SUSP: 2.0000 | Freq: Every day | NASAL | 6 refills | Status: DC

## 2020-08-28 MED ORDER — CETIRIZINE HCL 10 MG PO TABS: 10.0000 mg | ORAL_TABLET | Freq: Every day | ORAL | 11 refills | Status: DC

## 2020-08-28 MED ORDER — VITAMIN D (ERGOCALCIFEROL) 1.25 MG (50000 UNIT) PO CAPS: 50000.0000 [IU] | ORAL_CAPSULE | ORAL | 3 refills | Status: DC

## 2020-08-28 NOTE — Addendum Note (Signed)
Addended by: Austin Miles F on: 08/28/2020 10:05 AM   Modules accepted: Orders

## 2020-08-28 NOTE — Progress Notes (Signed)
Subjective:    Patient ID: Alejandra Harrison, female    DOB: May 11, 1960, 60 y.o.   MRN: [id number]  Chief Complaint  Patient presents with   Ear Fullness    RIGHT EAR JUST HAD AN EAR INFECTION    Annual Exam   Pt presents to the office today for CPE without pap. [name] followed by Cardiologists annually for heart block and has a pacemaker. [name] followed by [facility] every 6 months for Depression and GAD.   Ear Fullness  There is pain in the right ear. This is a recurrent problem. The current episode started in the past 7 days. The problem occurs constantly. The problem has been unchanged. There has been no fever. The patient is experiencing no pain. Associated symptoms include hearing loss. Pertinent negatives include no coughing, diarrhea, ear discharge or sore throat. She has tried ear drops for the symptoms. The treatment provided mild relief.  Gastroesophageal Reflux She complains of belching and heartburn. She reports no coughing or no sore throat. This is a chronic problem. The current episode started more than 1 year ago. The problem occurs occasionally. The symptoms are aggravated by certain foods. Risk factors include obesity. She has tried an antacid for the symptoms. The treatment provided moderate relief.  Insomnia Primary symptoms: difficulty falling asleep.   The current episode started more than one year. The onset quality is gradual. The problem occurs intermittently.  Constipation This is a chronic problem. The current episode started more than 1 year ago. The problem has been resolved since onset. Her stool frequency is 1 time per day. Pertinent negatives include no diarrhea. Risk factors include dietary change. The treatment provided moderate relief.     Review of Systems  HENT:  Positive for hearing loss. Negative for ear discharge and sore throat.   Respiratory:  Negative for cough.   Gastrointestinal:  Positive for constipation and heartburn. Negative for  diarrhea.  Psychiatric/Behavioral:  The patient has insomnia.   All other systems reviewed and are negative.     Objective:   Physical Exam Vitals reviewed.  Constitutional:      General: [name] not in acute distress.    Appearance: [name] well-developed. [name] obese.  HENT:     Head: Normocephalic and atraumatic.     Right Ear: Tympanic membrane normal.     Left Ear: Tympanic membrane normal.  Eyes:     Pupils: Pupils are equal, round, and reactive to light.  Neck:     Thyroid: No thyromegaly.  Cardiovascular:     Rate and Rhythm: Normal rate and regular rhythm.     Heart sounds: Normal heart sounds. No murmur heard. Pulmonary:     Effort: Pulmonary effort is normal. No respiratory distress.     Breath sounds: Normal breath sounds. No wheezing.  Abdominal:     General: Bowel sounds are normal. There is no distension.     Palpations: Abdomen is soft.     Tenderness: There is no abdominal tenderness.  Musculoskeletal:        General: No tenderness. Normal range of motion.     Cervical back: Normal range of motion and neck supple.  Skin:    General: Skin is warm and dry.  Neurological:     Mental Status: [name] alert and oriented to person, place, and time.     Cranial Nerves: No cranial nerve deficit.     Deep Tendon Reflexes: Reflexes are normal and symmetric.  Psychiatric:        Behavior: Behavior normal.        Thought Content: Thought content normal.        Judgment: Judgment normal.      BP 138/83   Pulse 64   Temp 97.8 F (36.6 C) (Temporal)   Ht _0  (1.626 m)   Wt 248 lb 12.8 oz (112.9 kg)   BMI 42.71 kg/m      Assessment & Plan:  SCOTTLYN MCHANEY comes in today with chief complaint of Ear Fullness (RIGHT EAR JUST HAD AN EAR INFECTION ) and Annual Exam   Diagnosis and orders addressed:  1. Annual physical exam - Vitamin D, Ergocalciferol, (DRISDOL) 1.25 MG (50000 UNIT) CAPS capsule; Take 1 capsule (50,000 Units total) by mouth once a week.   Dispense: 12 capsule; Refill: 3 - CMP14+EGFR - CBC with Differential/Platelet - Lipid panel - TSH - VITAMIN D 25 Hydroxy (Vit-D Deficiency, Fractures) - Vitamin B12  2. CHB (complete heart block) (HCC) - CMP14+EGFR - CBC with Differential/Platelet  3. Gastroesophageal reflux disease, unspecified whether esophagitis present - CMP14+EGFR - CBC with Differential/Platelet  4. Morbid obesity ([facility]) - CMP14+EGFR - CBC with Differential/Platelet  5. Insomnia due to mental disorder - ARIPiprazole (ABILIFY) 2 MG tablet; Take 0.5 tablets (1 mg total) by mouth daily.  Dispense: 30 tablet; Refill: 5 - CMP14+EGFR - CBC with Differential/Platelet  6. Vitamin D deficiency - CMP14+EGFR - CBC with Differential/Platelet - VITAMIN D 25 Hydroxy (Vit-D Deficiency, Fractures)  7. Constipation, unspecified constipation type - CMP14+EGFR - CBC with Differential/Platelet  8. Allergic rhinitis due to pollen, unspecified seasonality - fluticasone (FLONASE) 50 MCG/ACT nasal spray; Place 2 sprays into both nostrils daily.  Dispense: 16 g; Refill: 6 - cetirizine (ZYRTEC) 10 MG tablet; Take 1 tablet (10 mg total) by mouth daily.  Dispense: 30 tablet; Refill: 11  9. Sensation of fullness in right ear Start Zyrtec and flonase today - fluticasone (FLONASE) 50 MCG/ACT nasal spray; Place 2 sprays into both nostrils daily.  Dispense: 16 g; Refill: 6 - cetirizine (ZYRTEC) 10 MG tablet; Take 1 tablet (10 mg total) by mouth daily.  Dispense: 30 tablet; Refill: 11   Labs pending Health Maintenance reviewed- Shingles given today Diet and exercise encouraged  Follow up plan: 1 year   Evelina Dun, FNP

## 2020-08-28 NOTE — Patient Instructions (Signed)
Health Maintenance, Female Adopting a healthy lifestyle and getting preventive care are important in promoting health and wellness. Ask your health care provider about: The right schedule for you to have regular tests and exams. Things you can do on your own to prevent diseases and keep yourself healthy. What should I know about diet, weight, and exercise? Eat a healthy diet  Eat a diet that includes plenty of vegetables, fruits, low-fat dairy products, and lean protein. Do not eat a lot of foods that are high in solid fats, added sugars, or sodium.  Maintain a healthy weight Body mass index (BMI) is used to identify weight problems. It estimates body fat based on height and weight. Your health care provider can help determineyour BMI and help you achieve or maintain a healthy weight. Get regular exercise Get regular exercise. This is one of the most important things you can do for your health. Most adults should: Exercise for at least 150 minutes each week. The exercise should increase your heart rate and make you sweat (moderate-intensity exercise). Do strengthening exercises at least twice a week. This is in addition to the moderate-intensity exercise. Spend less time sitting. Even light physical activity can be beneficial. Watch cholesterol and blood lipids Have your blood tested for lipids and cholesterol at 61 years of age, then havethis test every 5 years. Have your cholesterol levels checked more often if: Your lipid or cholesterol levels are high. You are older than 61 years of age. You are at high risk for heart disease. What should I know about cancer screening? Depending on your health history and family history, you may need to have cancer screening at various ages. This may include screening for: Breast cancer. Cervical cancer. Colorectal cancer. Skin cancer. Lung cancer. What should I know about heart disease, diabetes, and high blood pressure? Blood pressure and heart  disease High blood pressure causes heart disease and increases the risk of stroke. This is more likely to develop in people who have high blood pressure readings, are of African descent, or are overweight. Have your blood pressure checked: Every 3-5 years if you are 18-39 years of age. Every year if you are 40 years old or older. Diabetes Have regular diabetes screenings. This checks your fasting blood sugar level. Have the screening done: Once every three years after age 40 if you are at a normal weight and have a low risk for diabetes. More often and at a younger age if you are overweight or have a high risk for diabetes. What should I know about preventing infection? Hepatitis B If you have a higher risk for hepatitis B, you should be screened for this virus. Talk with your health care provider to find out if you are at risk forhepatitis B infection. Hepatitis C Testing is recommended for: Everyone born from 1945 through 1965. Anyone with known risk factors for hepatitis C. Sexually transmitted infections (STIs) Get screened for STIs, including gonorrhea and chlamydia, if: You are sexually active and are younger than 61 years of age. You are older than 61 years of age and your health care provider tells you that you are at risk for this type of infection. Your sexual activity has changed since you were last screened, and you are at increased risk for chlamydia or gonorrhea. Ask your health care provider if you are at risk. Ask your health care provider about whether you are at high risk for HIV. Your health care provider may recommend a prescription medicine to help   prevent HIV infection. If you choose to take medicine to prevent HIV, you should first get tested for HIV. You should then be tested every 3 months for as long as you are taking the medicine. Pregnancy If you are about to stop having your period (premenopausal) and you may become pregnant, seek counseling before you get  pregnant. Take 400 to 800 micrograms (mcg) of folic acid every day if you become pregnant. Ask for birth control (contraception) if you want to prevent pregnancy. Osteoporosis and menopause Osteoporosis is a disease in which the bones lose minerals and strength with aging. This can result in bone fractures. If you are 65 years old or older, or if you are at risk for osteoporosis and fractures, ask your health care provider if you should: Be screened for bone loss. Take a calcium or vitamin D supplement to lower your risk of fractures. Be given hormone replacement therapy (HRT) to treat symptoms of menopause. Follow these instructions at home: Lifestyle Do not use any products that contain nicotine or tobacco, such as cigarettes, e-cigarettes, and chewing tobacco. If you need help quitting, ask your health care provider. Do not use street drugs. Do not share needles. Ask your health care provider for help if you need support or information about quitting drugs. Alcohol use Do not drink alcohol if: Your health care provider tells you not to drink. You are pregnant, may be pregnant, or are planning to become pregnant. If you drink alcohol: Limit how much you use to 0-1 drink a day. Limit intake if you are breastfeeding. Be aware of how much alcohol is in your drink. In the [place] one drink equals one 12 oz bottle of beer (355 mL), one 5 oz glass of wine (148 mL), or one 1 oz glass of hard liquor (44 mL). General instructions Schedule regular health, dental, and eye exams. Stay current with your vaccines. Tell your health care provider if: You often feel depressed. You have ever been abused or do not feel safe at home. Summary Adopting a healthy lifestyle and getting preventive care are important in promoting health and wellness. Follow your health care provider's instructions about healthy diet, exercising, and getting tested or screened for diseases. Follow your health care provider's  instructions on monitoring your cholesterol and blood pressure. This information is not intended to replace advice given to you by your health care provider. Make sure you discuss any questions you have with your healthcare provider. Document Revised: 02/03/2018 Document Reviewed: 02/03/2018 Elsevier Patient Education  2022 Elsevier Inc.  

## 2020-08-29 ENCOUNTER — Telehealth (HOSPITAL_COMMUNITY): Payer: BC Managed Care – PPO | Admitting: Psychiatry

## 2020-08-29 LAB — CMP14+EGFR
ALT: 29 IU/L (ref 0–32)
AST: 18 IU/L (ref 0–40)
Albumin/Globulin Ratio: 2.4 — ABNORMAL HIGH (ref 1.2–2.2)
Albumin: 4.7 g/dL (ref 3.8–4.8)
Alkaline Phosphatase: 79 IU/L (ref 44–121)
BUN/Creatinine Ratio: 12 (ref 12–28)
BUN: 9 mg/dL (ref 8–27)
Bilirubin Total: 0.4 mg/dL (ref 0.0–1.2)
CO2: 22 mmol/L (ref 20–29)
Calcium: 9.1 mg/dL (ref 8.7–10.3)
Chloride: 105 mmol/L (ref 96–106)
Creatinine, Ser: 0.73 mg/dL (ref 0.57–1.00)
Globulin, Total: 2 g/dL (ref 1.5–4.5)
Glucose: 87 mg/dL (ref 65–99)
Potassium: 4.9 mmol/L (ref 3.5–5.2)
Sodium: 140 mmol/L (ref 134–144)
Total Protein: 6.7 g/dL (ref 6.0–8.5)
eGFR: 94 mL/min/{1.73_m2} (ref >59)

## 2020-08-29 LAB — CBC WITH DIFFERENTIAL/PLATELET
Basophils Absolute: 0.1 10*3/uL (ref 0.0–0.2)
Basos: 1 %
EOS (ABSOLUTE): 0.3 10*3/uL (ref 0.0–0.4)
Eos: 5 %
Hematocrit: 44.6 % (ref 34.0–46.6)
Hemoglobin: 14.5 g/dL (ref 11.1–15.9)
Immature Grans (Abs): 0 10*3/uL (ref 0.0–0.1)
Immature Granulocytes: 0 %
Lymphocytes Absolute: 1.8 10*3/uL (ref 0.7–3.1)
Lymphs: 25 %
MCH: 29.4 pg (ref 26.6–33.0)
MCHC: 32.5 g/dL (ref 31.5–35.7)
MCV: 90 fL (ref 79–97)
Monocytes Absolute: 0.5 10*3/uL (ref 0.1–0.9)
Monocytes: 7 %
Neutrophils Absolute: 4.4 10*3/uL (ref 1.4–7.0)
Neutrophils: 62 %
Platelets: 291 10*3/uL (ref 150–450)
RBC: 4.94 x10E6/uL (ref 3.77–5.28)
RDW: 13.3 % (ref 11.7–15.4)
WBC: 7.1 10*3/uL (ref 3.4–10.8)

## 2020-08-29 LAB — TSH: TSH: 2.47 u[IU]/mL (ref 0.450–4.500)

## 2020-08-29 LAB — LIPID PANEL
Chol/HDL Ratio: 3.2 ratio (ref 0.0–4.4)
Cholesterol, Total: 162 mg/dL (ref 100–199)
HDL: 51 mg/dL (ref >39)
LDL Chol Calc (NIH): 94 mg/dL (ref 0–99)
Triglycerides: 94 mg/dL (ref 0–149)
VLDL Cholesterol Cal: 17 mg/dL (ref 5–40)

## 2020-08-29 LAB — VITAMIN D 25 HYDROXY (VIT D DEFICIENCY, FRACTURES): Vit D, 25-Hydroxy: 39.4 ng/mL (ref 30.0–100.0)

## 2020-08-29 LAB — VITAMIN B12: Vitamin B-12: 253 pg/mL (ref 232–1245)

## 2020-09-19 ENCOUNTER — Ambulatory Visit
Admission: RE | Admit: 2020-09-19 | Discharge: 2020-09-19 | Disposition: A | Discharge status: Home / Self Care | Payer: BC Managed Care – PPO | Source: Ambulatory Visit | Attending: Family | Admitting: Family

## 2020-09-19 ENCOUNTER — Other Ambulatory Visit: Payer: Self-pay

## 2020-09-19 DIAGNOSIS — Z1231 Encounter for screening mammogram for malignant neoplasm of breast: Secondary | ICD-10-CM

## 2020-09-25 ENCOUNTER — Telehealth (HOSPITAL_COMMUNITY): Payer: BC Managed Care – PPO | Admitting: Psychiatry

## 2020-09-26 ENCOUNTER — Telehealth (HOSPITAL_COMMUNITY): Payer: BC Managed Care – PPO | Admitting: Psychiatry

## 2020-10-17 ENCOUNTER — Encounter (HOSPITAL_COMMUNITY): Payer: Self-pay | Admitting: Psychiatry

## 2020-10-17 ENCOUNTER — Other Ambulatory Visit: Payer: Self-pay

## 2020-10-17 ENCOUNTER — Telehealth (INDEPENDENT_AMBULATORY_CARE_PROVIDER_SITE_OTHER): Payer: BC Managed Care – PPO | Admitting: Psychiatry

## 2020-10-17 DIAGNOSIS — F23 Brief psychotic disorder: Secondary | ICD-10-CM | POA: Diagnosis not present

## 2020-10-17 DIAGNOSIS — F5105 Insomnia due to other mental disorder: Secondary | ICD-10-CM

## 2020-10-17 MED ORDER — ARIPIPRAZOLE 2 MG PO TABS: 1.0000 mg | ORAL_TABLET | Freq: Every day | ORAL | 5 refills | Status: DC

## 2020-10-17 NOTE — Progress Notes (Signed)
Virtual Visit via Telephone Note  I connected with LAKINDRA WIBLE on 10/17/20 at  9:20 AM EDT by telephone and verified that I am speaking with the correct person using two identifiers.  Location: Patient: home Provider: home office   I discussed the limitations, risks, security and privacy concerns of performing an evaluation and management service by telephone and the availability of in person appointments. I also discussed with the patient that there may be a patient responsible charge related to this service. The patient expressed understanding and agreed to proceed.    I discussed the assessment and treatment plan with the patient. The patient was provided an opportunity to ask questions and all were answered. The patient agreed with the plan and demonstrated an understanding of the instructions.   The patient was advised to call back or seek an in-person evaluation if the symptoms worsen or if the condition fails to improve as anticipated.  I provided 15 minutes of non-face-to-face time during this encounter.   [name], MD  [facility] MD/PA/NP OP Progress Note  10/17/2020 9:51 AM [name]  MRN:  [id number]  Chief Complaint:  Chief Complaint   Hallucinations; Follow-up    HPI: This patient is a 60 year old married white female who lives with her husband and 2 sons in [place]. 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   The patient states that in 2012 she started seeing a figure that she thought may have been God. She also got fixated on a man in her Sunday school and thought that her husband would die and she would have to marry this man. She was having lots of religious visions. She claims that she had not been on any medication or using drugs or alcohol at that time. She had not had a head injury or any additional stress. She may of been going through menopause. She denied visual hallucinations but claims she felt "high and on top of the world." She's never  had a history of depression however.   At any rate she was seen here by Dr. Toni Arthurs and started on Abilify. She was on a higher dose such as 5 mg in the past and it caused akathisia. Currently she takes 1 mg per day and it seems to be working fairly well. She's no longer having visions or unusual thoughts and she's   Patient returns for follow-up after 6 months.  She states that overall she has been doing well.  She has had no further recurrence of hallucinations or delusions.  Her mood is good and she is sleeping well.  Her cardiac status has been stable.  [name] concerned because her 52 year old sister was recently diagnosed with early dementia.  However from the symptoms she is describing it sounds like it could also have been a psychotic disorder as well.  The patient is now concerned about developing dementia and I explained that the risk is higher than the average person there is nothing more she can do other than to try to preserve her brain health through exercise healthy food and keeping her brain active. Visit Diagnosis:    ICD-10-CM   1. 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 ARIPiprazole (ABILIFY) 2 MG tablet      Past Psychiatric History: none  Past Medical History:  Past Medical History:  Diagnosis Date   Anxiety    AV block, complete (HCC)  History of seasonal allergies    Psychosis [facility])     Past Surgical History:  Procedure Laterality Date   COLONOSCOPY N/A 09/28/2015   Procedure: COLONOSCOPY;  Surgeon: West Bali, MD;  Location: [facility] ENDO SUITE;  Service: Endoscopy;  Laterality: N/A;  1030   EP IMPLANTABLE DEVICE N/A 05/17/2015   Procedure: Pacemaker Implant;  Surgeon: Will Jorja Loa, MD;  Location: [facility] INVASIVE CV LAB;  Service: Cardiovascular;  Laterality: N/A;   INSERT / REPLACE / REMOVE PACEMAKER  05/17/2015   TUBAL LIGATION      Family Psychiatric History: see below  Family History:  Family History  Problem  Relation Age of Onset   Dementia Mother    Diabetes Father    Breast cancer Paternal Aunt    ADD / ADHD Neg Hx    Alcohol abuse Neg Hx    Drug abuse Neg Hx    Anxiety disorder Neg Hx    Bipolar disorder Neg Hx    Depression Neg Hx    OCD Neg Hx    Paranoid behavior Neg Hx    Schizophrenia Neg Hx    Seizures Neg Hx    Sexual abuse Neg Hx    Physical abuse Neg Hx    Colon cancer Neg Hx     Social History:  Social History   Socioeconomic History   Marital status: Married    Spouse name: Not on file   Number of children: Not on file   Years of education: Not on file   Highest education level: Not on file  Occupational History   Not on file  Tobacco Use   Smoking status: Never   Smokeless tobacco: Never   Tobacco comments:    Never smoked  Vaping Use   Vaping Use: Never used  Substance and Sexual Activity   Alcohol use: No    Alcohol/week: 0.0 standard drinks   Drug use: No   Sexual activity: Not Currently    Birth control/protection: Post-menopausal  Other Topics Concern   Not on file  Social History Narrative   Not on file   Social Determinants of Health   Financial Resource Strain: Not on file  Food Insecurity: Not on file  Transportation Needs: Not on file  Physical Activity: Not on file  Stress: Not on file  Social Connections: Not on file    Allergies:  Allergies  Allergen Reactions   Oxycodone-Acetaminophen Hives and Other (See Comments)    Hands peeled   Penicillins Hives and Other (See Comments)    Hands peeled    Metabolic Disorder Labs: No results found for: HGBA1C, MPG No results found for: PROLACTIN Lab Results  Component Value Date   CHOL 162 08/28/2020   TRIG 94 08/28/2020   HDL 51 08/28/2020   CHOLHDL 3.2 08/28/2020   LDLCALC 94 08/28/2020   LDLCALC 96 08/24/2019   Lab Results  Component Value Date   TSH 2.470 08/28/2020   TSH 2.610 08/24/2019    Therapeutic Level Labs: No results found for: LITHIUM No results found  for: VALPROATE No components found for:  CBMZ  Current Medications: Current Outpatient Medications  Medication Sig Dispense Refill   ARIPiprazole (ABILIFY) 2 MG tablet Take 0.5 tablets (1 mg total) by mouth daily. 30 tablet 5   cetirizine (ZYRTEC) 10 MG tablet Take 1 tablet (10 mg total) by mouth daily. 30 tablet 11   fluticasone (FLONASE) 50 MCG/ACT nasal spray Place 2 sprays into both nostrils daily. 16 g 6  metoprolol tartrate (LOPRESSOR) 50 MG tablet Take 1 tablet (50 mg total) by mouth 2 (two) times daily. Please keep upcoming appt in June 2022 with Dr. Elberta Fortis before anymore refills. Thank you 180 tablet 0   Vitamin D, Ergocalciferol, (DRISDOL) 1.25 MG (50000 UNIT) CAPS capsule Take 1 capsule (50,000 Units total) by mouth once a week. 12 capsule 3   No current facility-administered medications for this visit.     Musculoskeletal: Strength & Muscle Tone: within normal limits Gait & Station: normal Patient leans: N/A  Psychiatric Specialty Exam: Review of Systems  All other systems reviewed and are negative.  There were no vitals taken for this visit.There is no height or weight on file to calculate BMI.  General Appearance: NA  Eye Contact:  NA  Speech:  Clear and Coherent  Volume:  Normal  Mood:  Euthymic  Affect:  NA  Thought Process:  Goal Directed  Orientation:  Full (Time, Place, and Person)  Thought Content: WDL   Suicidal Thoughts:  No  Homicidal Thoughts:  No  Memory:  Immediate;   Good Recent;   Good Remote;   Good  Judgement:  Good  Insight:  Fair  Psychomotor Activity:  Normal  Concentration:  Concentration: Good and Attention Span: Good  Recall:  Good  Fund of Knowledge: Good  Language: Good  Akathisia:  No  Handed:  Right  AIMS (if indicated): not done  Assets:  Communication Skills Desire for Improvement Resilience Social Support Talents/Skills  ADL's:  Intact  Cognition: WNL  Sleep:  Good   Screenings: GAD-7    Flowsheet Row Office  Visit from 08/28/2020 in [facility] Office Visit from 08/10/2020 in [facility]  Total GAD-7 Score 0 0      PHQ2-9    Flowsheet Row Office Visit from 08/28/2020 in [facility] Office Visit from 08/10/2020 in [facility] Office Visit from 06/01/2020 in [facility] Office Visit from 08/24/2019 in [facility] Office Visit from 08/02/2019 in [facility]  PHQ-2 Total Score 0 0 0 0 0  PHQ-9 Total Score 0 0 -- -- --        Assessment and Plan: This patient is a 60 year old female with past episode of psychosis that was quite brief.  She has been maintained on Abilify 1 mg daily without relapse or side effect.  She will continue this dosage and return to see me in 6 months   [name], MD 10/17/2020, 9:51 AM

## 2020-10-22 ENCOUNTER — Telehealth: Payer: Self-pay | Admitting: Cardiology

## 2020-10-22 NOTE — Telephone Encounter (Signed)
Patient states [facility], Dr. Caryl Asp, is requesting a letter for an annual DOT physical clearance. Patient provided a fax #, [phone].

## 2020-10-22 NOTE — Telephone Encounter (Signed)
Pt is calling to ask if Dr. Elberta Fortis has received forms and request for records to be released,by [facility], for an annual DOT physical clearance. Pt states they were to fax a generic letter and forms to Dr. Elberta Fortis to fill out and get back to them.  She states they were to send it to [phone].   Informed the pt that we have no documented forms/letter from the information provided in this message, for Dr. Elberta Fortis to fill out.  Informed the pt there is nothing documented in her chart, in Dr. Elberta Fortis mailbox, or at [phone] fax machine requesting the following.  Advised the pt to call them first thing in the morning and have them fax information to [phone] ATTN: Dr. Elberta Fortis, so we can receive them and start working on this for the pt.  Pt verbalized understanding and agrees with this plan. Pt states she will follow-up with them in the morning and request for information to be faxed to our office. Will route this message to Dr. Elberta Fortis nurse for further follow-up on this matter, when she returns to the office.

## 2020-10-23 ENCOUNTER — Ambulatory Visit (INDEPENDENT_AMBULATORY_CARE_PROVIDER_SITE_OTHER): Payer: BC Managed Care – PPO

## 2020-10-23 DIAGNOSIS — I442 Atrioventricular block, complete: Secondary | ICD-10-CM

## 2020-10-23 LAB — CUP PACEART REMOTE DEVICE CHECK
Battery Remaining Longevity: 62 mo
Battery Remaining Percentage: 49 %
Battery Voltage: 2.96 V
Brady Statistic AP VP Percent: 13 %
Brady Statistic AP VS Percent: 1 %
Brady Statistic AS VP Percent: 87 %
Brady Statistic AS VS Percent: 1 %
Brady Statistic RA Percent Paced: 12 %
Brady Statistic RV Percent Paced: 99 %
Date Time Interrogation Session: 20220830024441
Implantable Lead Implant Date: 20170323
Implantable Lead Implant Date: 20170323
Implantable Lead Location: 753859
Implantable Lead Location: 753860
Implantable Pulse Generator Implant Date: 20170323
Lead Channel Impedance Value: 450 Ohm
Lead Channel Impedance Value: 610 Ohm
Lead Channel Pacing Threshold Amplitude: 0.625 V
Lead Channel Pacing Threshold Amplitude: 0.875 V
Lead Channel Pacing Threshold Pulse Width: 0.4 ms
Lead Channel Pacing Threshold Pulse Width: 0.4 ms
Lead Channel Sensing Intrinsic Amplitude: 12 mV
Lead Channel Sensing Intrinsic Amplitude: 2.9 mV
Lead Channel Setting Pacing Amplitude: 1.125
Lead Channel Setting Pacing Amplitude: 1.625
Lead Channel Setting Pacing Pulse Width: 0.4 ms
Lead Channel Setting Sensing Sensitivity: 4 mV
Pulse Gen Model: 2272
Pulse Gen Serial Number: 3134337

## 2020-10-24 NOTE — Telephone Encounter (Signed)
Follow up:     Patient calling to check to see if the fax that was sent yesterday morning. Please advise. A message can be left on her answering machine.

## 2020-10-25 NOTE — Telephone Encounter (Signed)
Patient is following up regarding the annual documentation for her DOT physical. Please return call with any updates.

## 2020-10-25 NOTE — Telephone Encounter (Signed)
Spoke with pt husband, she is needing information regarding her pacemaker for DOT to get cleared. They were told the information had been faxed but they did not receive it. She has sent 2 forms to our office for [facility]. From Caryl Asp. Patient can be reached until 2 pm at [phone]. Aware will forward to dr Elberta Fortis and his nurse.

## 2020-10-26 NOTE — Telephone Encounter (Signed)
Still have not received documentation.  MR will contact Pt to have her fax again.

## 2020-10-26 NOTE — Telephone Encounter (Signed)
Patient is following up regarding her annual documentation for DOT clearance. She would like to know if we have any updates.

## 2020-10-29 ENCOUNTER — Other Ambulatory Visit: Payer: Self-pay | Admitting: Cardiology

## 2020-10-31 ENCOUNTER — Other Ambulatory Visit: Payer: Self-pay | Admitting: Cardiology

## 2020-11-05 NOTE — Progress Notes (Signed)
Remote pacemaker transmission.   

## 2021-01-22 ENCOUNTER — Ambulatory Visit (INDEPENDENT_AMBULATORY_CARE_PROVIDER_SITE_OTHER): Payer: BC Managed Care – PPO

## 2021-01-22 DIAGNOSIS — I442 Atrioventricular block, complete: Secondary | ICD-10-CM

## 2021-01-22 LAB — CUP PACEART REMOTE DEVICE CHECK
Battery Remaining Longevity: 59 mo
Battery Remaining Percentage: 47 %
Battery Voltage: 2.95 V
Brady Statistic AP VP Percent: 13 %
Brady Statistic AP VS Percent: 1 %
Brady Statistic AS VP Percent: 87 %
Brady Statistic AS VS Percent: 1 %
Brady Statistic RA Percent Paced: 13 %
Brady Statistic RV Percent Paced: 99 %
Date Time Interrogation Session: 20221129022643
Implantable Lead Implant Date: 20170323
Implantable Lead Implant Date: 20170323
Implantable Lead Location: 753859
Implantable Lead Location: 753860
Implantable Pulse Generator Implant Date: 20170323
Lead Channel Impedance Value: 410 Ohm
Lead Channel Impedance Value: 630 Ohm
Lead Channel Pacing Threshold Amplitude: 0.625 V
Lead Channel Pacing Threshold Amplitude: 0.875 V
Lead Channel Pacing Threshold Pulse Width: 0.4 ms
Lead Channel Pacing Threshold Pulse Width: 0.4 ms
Lead Channel Sensing Intrinsic Amplitude: 12 mV
Lead Channel Sensing Intrinsic Amplitude: 3.1 mV
Lead Channel Setting Pacing Amplitude: 1.125
Lead Channel Setting Pacing Amplitude: 1.625
Lead Channel Setting Pacing Pulse Width: 0.4 ms
Lead Channel Setting Sensing Sensitivity: 4 mV
Pulse Gen Model: 2272
Pulse Gen Serial Number: 3134337

## 2021-01-31 NOTE — Progress Notes (Signed)
Remote pacemaker transmission.   

## 2021-02-23 IMAGING — DX DG CHEST 2V
2 series · 2 of 2 positions shown · non-contrast
Comparison: May 18, 2015.

CLINICAL DATA: Left shoulder pain.

EXAM:
CHEST - 2 VIEW

[chest pa]
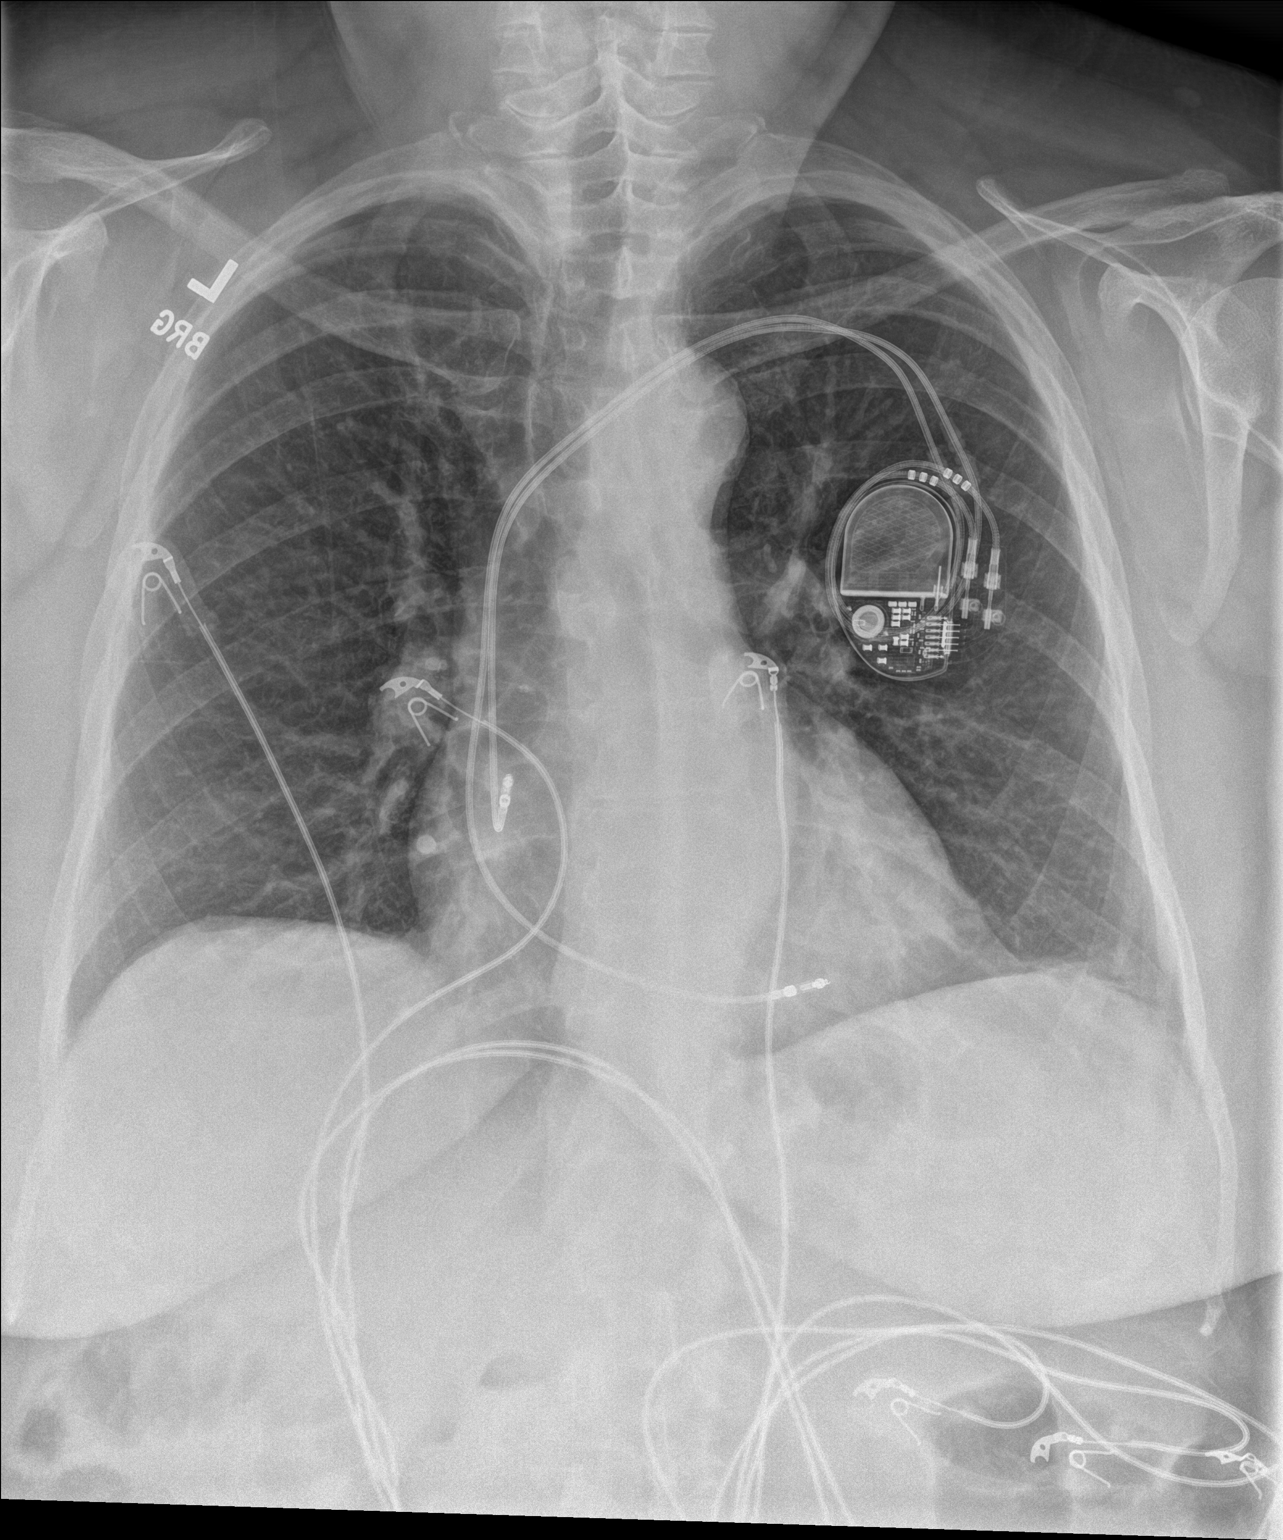

[chest lat]
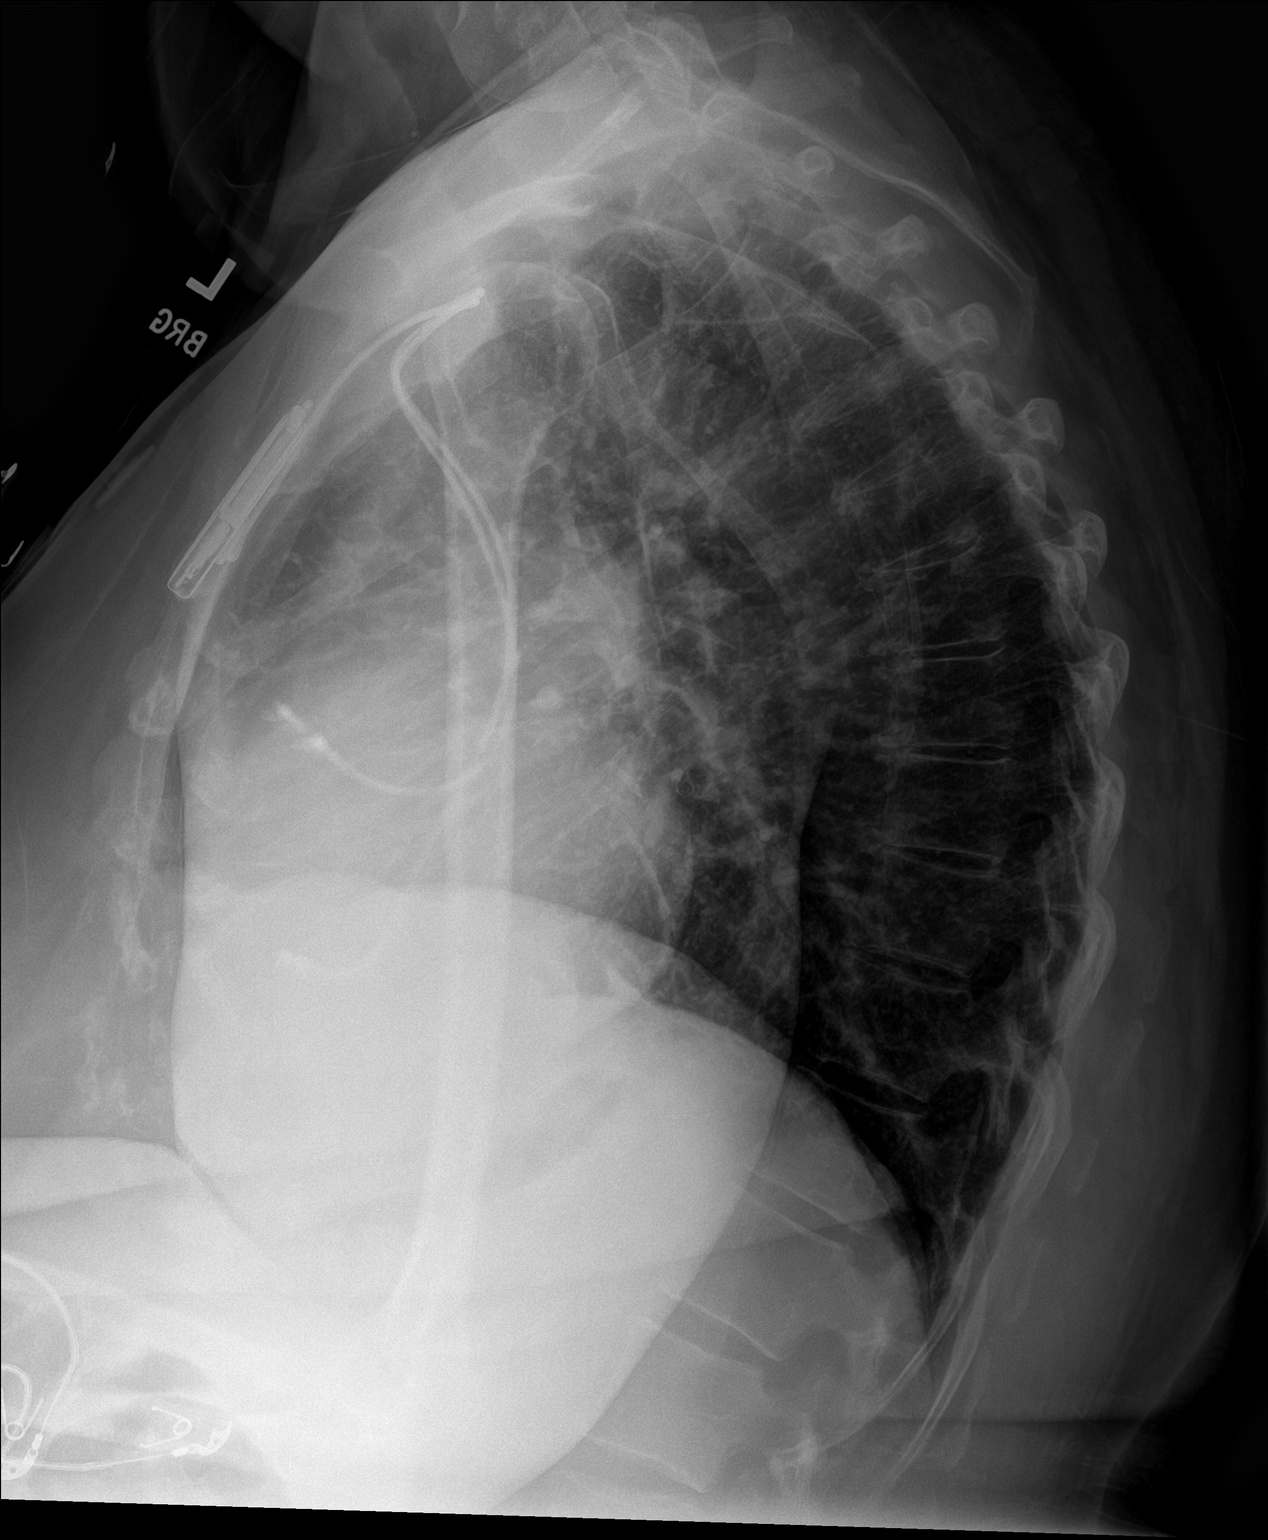

[2 of 2 positions shown; findings below may reference images not displayed]

FINDINGS: The heart size and mediastinal contours are within normal limits.
Both lungs are clear. Left-sided pacemaker is unchanged in position.
No pneumothorax or pleural effusion is noted. The visualized
skeletal structures are unremarkable.
IMPRESSION: No active cardiopulmonary disease.

## 2021-02-23 IMAGING — US US EXTREM  UP VENOUS*L*
1 series · 13 of 24 positions shown · non-contrast
Comparison: None.

CLINICAL DATA: 59-year-old female with edema left upper extremity



[Series 1: us venous img upper uni left (dvt) · portal-venous · 13 of 38 slices shown]
[im 1/38]
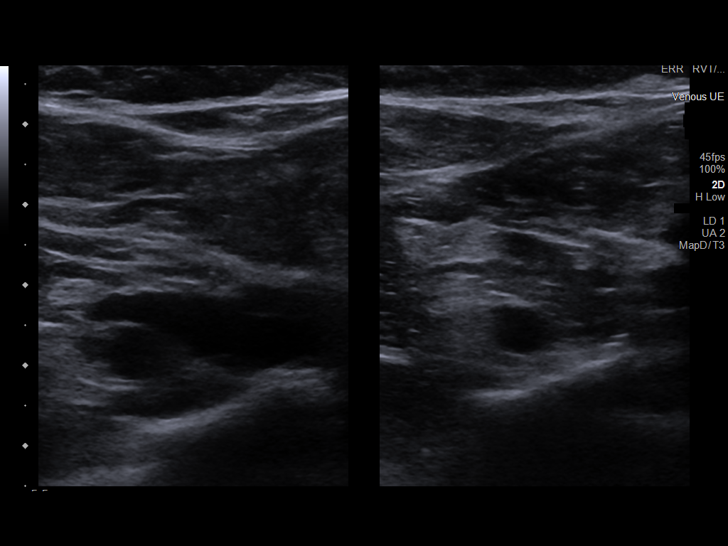
[im 4/38]
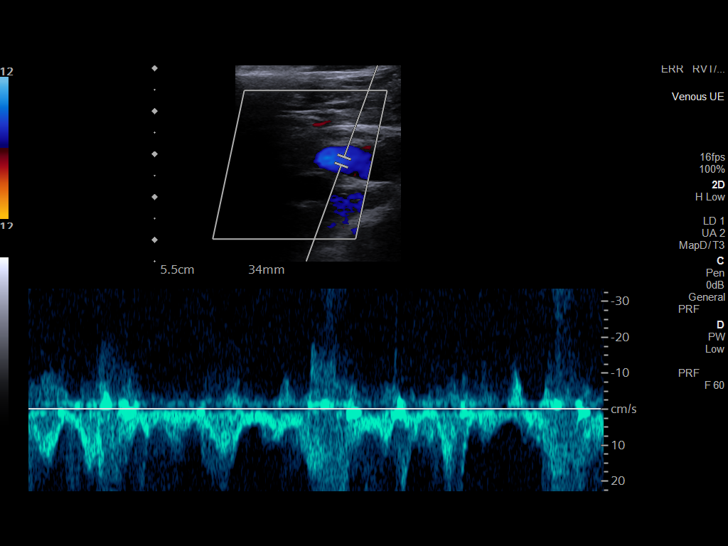
[im 7/38]
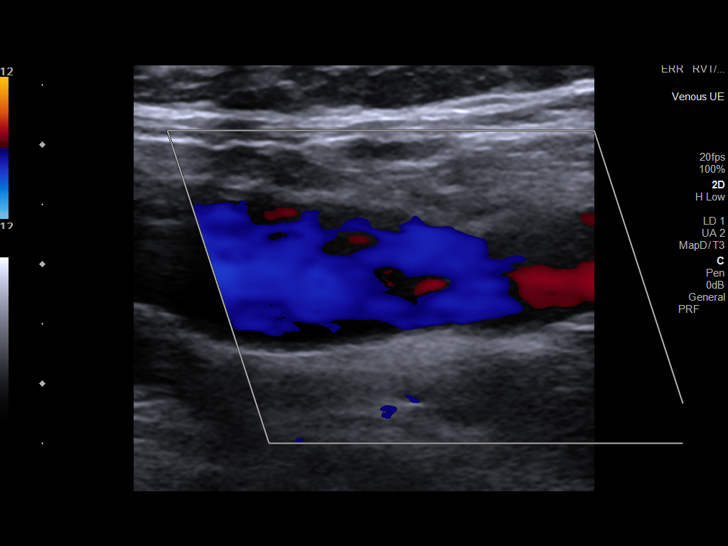
[im 10/38]
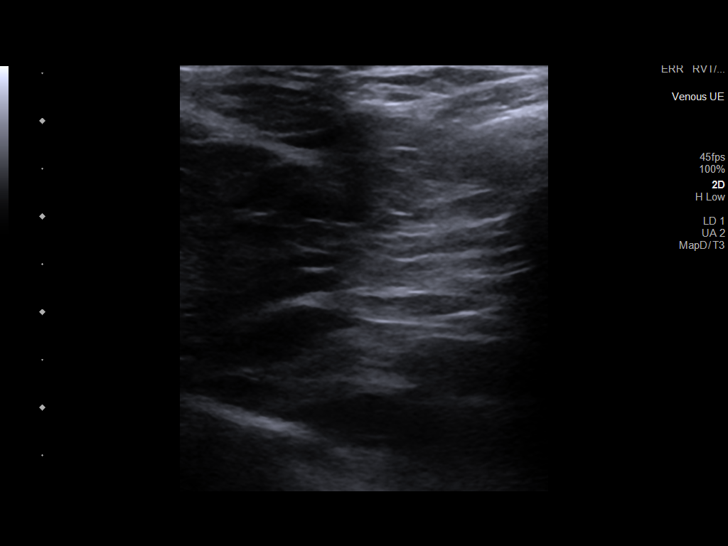
[im 13/38]
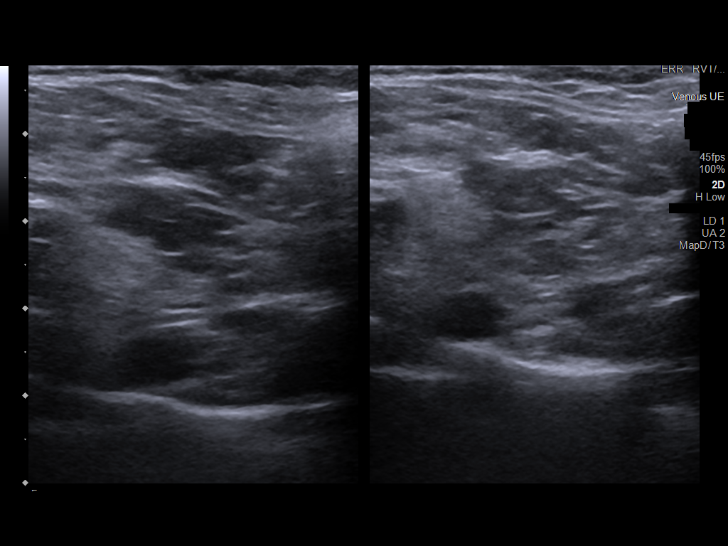
[im 17/38]
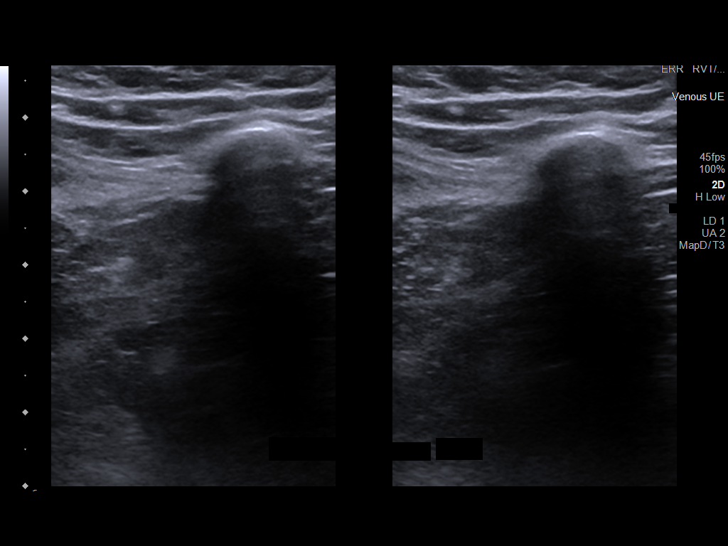
[im 20/38]
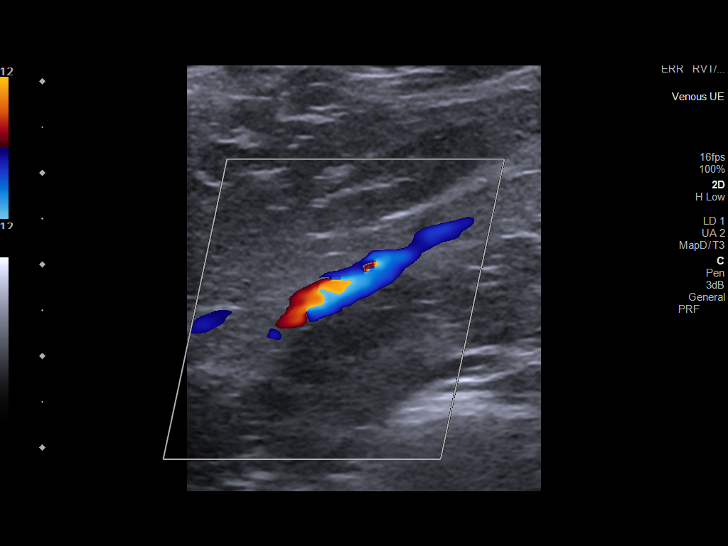
[im 21/38]
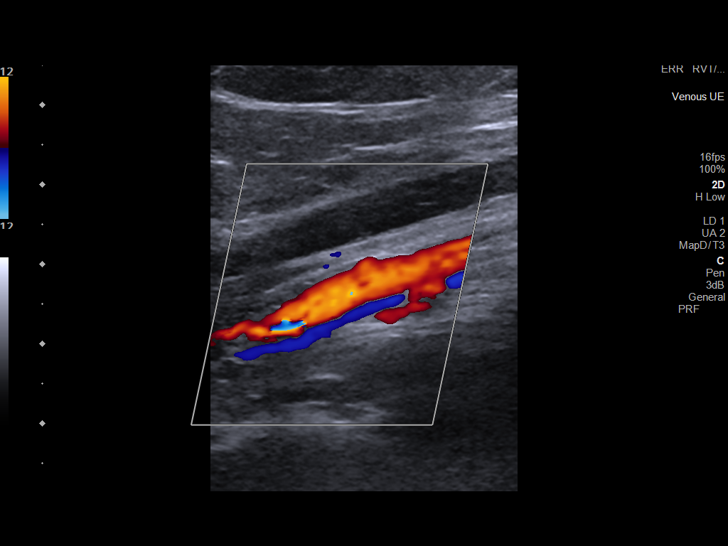
[im 25/38]
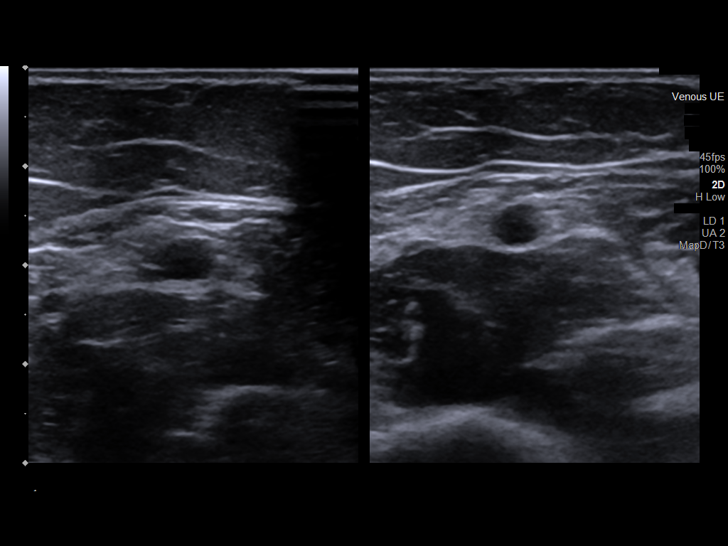
[im 28/38]
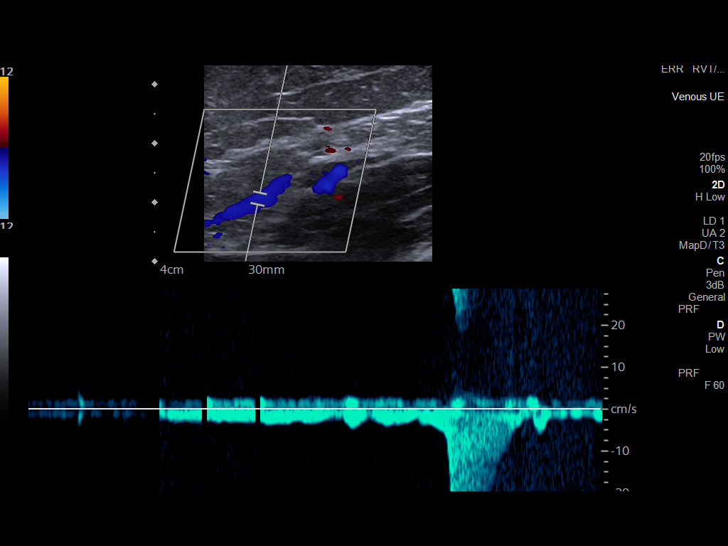
[im 31/38]
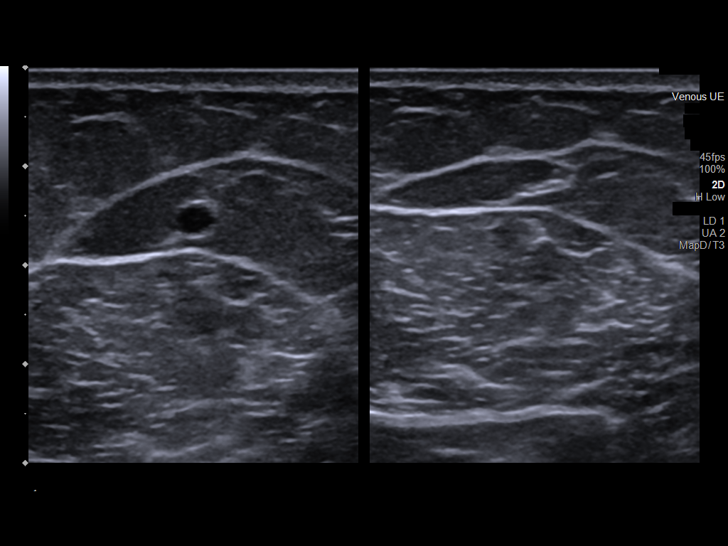
[im 34/38]
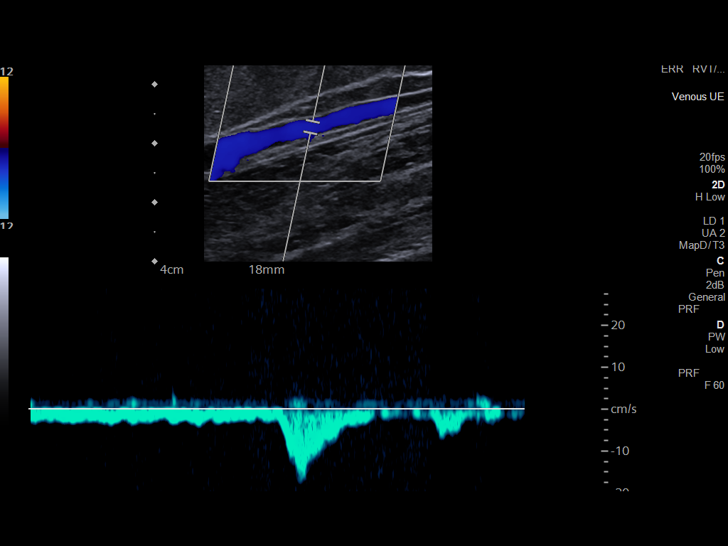
[im 38/38]
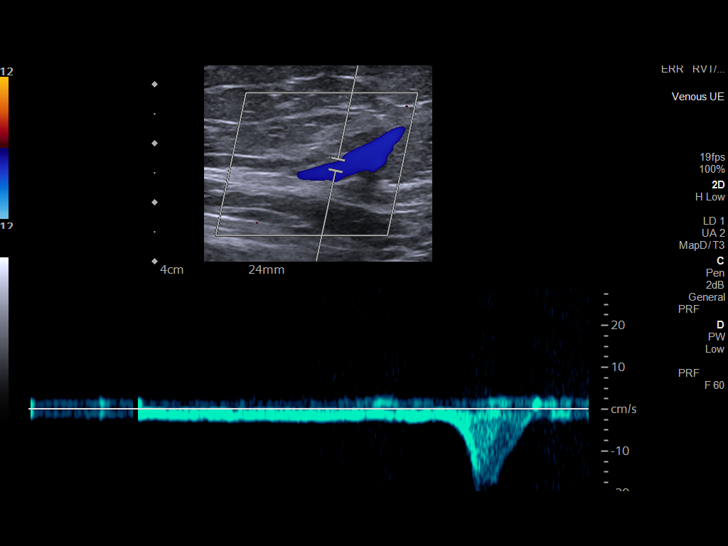

[13 of 24 positions shown; findings below may reference images not displayed]

FINDINGS: Contralateral Subclavian Vein: Respiratory phasicity is normal and
symmetric with the symptomatic side. No evidence of thrombus. Normal
compressibility.

Internal Jugular Vein: No evidence of thrombus. Normal
compressibility, respiratory phasicity and response to augmentation.

Subclavian Vein: No evidence of thrombus. Normal compressibility,
respiratory phasicity and response to augmentation.

Axillary Vein: No evidence of thrombus. Normal compressibility,
respiratory phasicity and response to augmentation.

Cephalic Vein: No evidence of thrombus. Normal compressibility,
respiratory phasicity and response to augmentation.

Basilic Vein: No evidence of thrombus. Normal compressibility,
respiratory phasicity and response to augmentation.

Brachial Veins: No evidence of thrombus. Normal compressibility,
respiratory phasicity and response to augmentation.

Radial Veins: No evidence of thrombus. Normal compressibility,
respiratory phasicity and response to augmentation.

Ulnar Veins: No evidence of thrombus. Normal compressibility,
respiratory phasicity and response to augmentation.

Other Findings:  None visualized.
IMPRESSION: Sonographic survey of the left upper extremity negative for DVT.

## 2021-04-08 ENCOUNTER — Other Ambulatory Visit: Payer: Self-pay

## 2021-04-08 ENCOUNTER — Encounter (HOSPITAL_COMMUNITY): Payer: Self-pay | Admitting: Psychiatry

## 2021-04-08 ENCOUNTER — Telehealth (INDEPENDENT_AMBULATORY_CARE_PROVIDER_SITE_OTHER): Payer: BC Managed Care – PPO | Admitting: Psychiatry

## 2021-04-08 DIAGNOSIS — F5105 Insomnia due to other mental disorder: Secondary | ICD-10-CM

## 2021-04-08 DIAGNOSIS — F23 Brief psychotic disorder: Secondary | ICD-10-CM | POA: Diagnosis not present

## 2021-04-08 MED ORDER — ARIPIPRAZOLE 2 MG PO TABS: 1.0000 mg | ORAL_TABLET | Freq: Every day | ORAL | 5 refills | Status: DC

## 2021-04-08 NOTE — Progress Notes (Signed)
Virtual Visit via Telephone Note  I connected with Alejandra Harrison on 04/08/21 at  9:00 AM EST by telephone and verified that I am speaking with the correct person using two identifiers.  Location: Patient: home Provider: office   I discussed the limitations, risks, security and privacy concerns of performing an evaluation and management service by telephone and the availability of in person appointments. I also discussed with the patient that there may be a patient responsible charge related to this service. The patient expressed understanding and agreed to proceed.      I discussed the assessment and treatment plan with the patient. The patient was provided an opportunity to ask questions and all were answered. The patient agreed with the plan and demonstrated an understanding of the instructions.   The patient was advised to call back or seek an in-person evaluation if the symptoms worsen or if the condition fails to improve as anticipated.  I provided 10 minutes of non-face-to-face time during this encounter.   [name], MD  [facility] MD/PA/NP OP Progress Note  04/08/2021 9:25 AM Alejandra Harrison  MRN:  [id number]  Chief Complaint:  Chief Complaint   Hallucinations; Follow-up    HPI: This patient is a 61 year old married white female who lives with her husband and 2 sons in [place]. 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   The patient states that in 2012 she started seeing a figure that she thought may have been God. She also got fixated on a man in her Sunday school and thought that her husband would die and she would have to marry this man. She was having lots of religious visions. She claims that she had not been on any medication or using drugs or alcohol at that time. She had not had a head injury or any additional stress. She may of been going through menopause. She denied visual hallucinations but claims she felt "high and on top of the world." She's never  had a history of depression however.   At any rate she was seen here by Dr. Toni Arthurs and started on Abilify. She was on a higher dose such as 5 mg in the past and it caused akathisia. Currently she takes 1 mg per day and it seems to be working fairly well. She's no longer having visions or unusual thoughts   The patient returns after 6 months.  She still is very worried about her 68 year old sister who has been diagnosed with early dementia.  It seems to be advancing rather quickly.  Her sister is currently in the hospital at [facility].  Her brother is trying to gain guardianship so he can have her placed in a nursing facility.  The patient is glad that her sister is in a safe place.  As for herself she states that she is doing well.  She continues to work.  She has had no further delusions or hallucinations.  [name] sleeping well. Visit Diagnosis:    ICD-10-CM   1. 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 ARIPiprazole (ABILIFY) 2 MG tablet      Past Psychiatric History: none  Past Medical History:  Past Medical History:  Diagnosis Date   Anxiety    AV block, complete (HCC)    History of seasonal allergies    Psychosis (HCC)     Past Surgical History:  Procedure Laterality Date   COLONOSCOPY  N/A 09/28/2015   Procedure: COLONOSCOPY;  Surgeon: West Bali, MD;  [facility];  Service: Endoscopy;  Laterality: N/A;  1030   EP IMPLANTABLE DEVICE N/A 05/17/2015   Procedure: Pacemaker Implant;  Surgeon: Will Jorja Loa, MD;  [facility];  Service: Cardiovascular;  Laterality: N/A;   INSERT / REPLACE / REMOVE PACEMAKER  05/17/2015   TUBAL LIGATION      Family Psychiatric History: see below  Family History:  Family History  Problem Relation Age of Onset   Dementia Mother    Diabetes Father    Breast cancer Paternal Aunt    ADD / ADHD Neg Hx    Alcohol abuse Neg Hx    Drug abuse Neg Hx    Anxiety disorder Neg Hx     Bipolar disorder Neg Hx    Depression Neg Hx    OCD Neg Hx    Paranoid behavior Neg Hx    Schizophrenia Neg Hx    Seizures Neg Hx    Sexual abuse Neg Hx    Physical abuse Neg Hx    Colon cancer Neg Hx     Social History:  Social History   Socioeconomic History   Marital status: Married    Spouse name: Not on file   Number of children: Not on file   Years of education: Not on file   Highest education level: Not on file  Occupational History   Not on file  Tobacco Use   Smoking status: Never   Smokeless tobacco: Never   Tobacco comments:    Never smoked  Vaping Use   Vaping Use: Never used  Substance and Sexual Activity   Alcohol use: No    Alcohol/week: 0.0 standard drinks   Drug use: No   Sexual activity: Not Currently    Birth control/protection: Post-menopausal  Other Topics Concern   Not on file  Social History Narrative   Not on file   Social Determinants of Health   Financial Resource Strain: Not on file  Food Insecurity: Not on file  Transportation Needs: Not on file  Physical Activity: Not on file  Stress: Not on file  Social Connections: Not on file    Allergies:  Allergies  Allergen Reactions   Oxycodone-Acetaminophen Hives and Other (See Comments)    Hands peeled   Penicillins Hives and Other (See Comments)    Hands peeled    Metabolic Disorder Labs: No results found for: HGBA1C, MPG No results found for: PROLACTIN Lab Results  Component Value Date   CHOL 162 08/28/2020   TRIG 94 08/28/2020   HDL 51 08/28/2020   CHOLHDL 3.2 08/28/2020   LDLCALC 94 08/28/2020   LDLCALC 96 08/24/2019   Lab Results  Component Value Date   TSH 2.470 08/28/2020   TSH 2.610 08/24/2019    Therapeutic Level Labs: No results found for: LITHIUM No results found for: VALPROATE No components found for:  CBMZ  Current Medications: Current Outpatient Medications  Medication Sig Dispense Refill   ARIPiprazole (ABILIFY) 2 MG tablet Take 0.5 tablets (1  mg total) by mouth daily. 30 tablet 5   cetirizine (ZYRTEC) 10 MG tablet Take 1 tablet (10 mg total) by mouth daily. 30 tablet 11   fluticasone (FLONASE) 50 MCG/ACT nasal spray Place 2 sprays into both nostrils daily. 16 g 6   metoprolol tartrate (LOPRESSOR) 50 MG tablet Take 1 tablet (50 mg total) by mouth 2 (two) times daily. 180 tablet 2  Vitamin D, Ergocalciferol, (DRISDOL) 1.25 MG (50000 UNIT) CAPS capsule Take 1 capsule (50,000 Units total) by mouth once a week. 12 capsule 3   No current facility-administered medications for this visit.     Musculoskeletal: Strength & Muscle Tone: na Gait & Station: na Patient leans: N/A  Psychiatric Specialty Exam: Review of Systems  All other systems reviewed and are negative.  There were no vitals taken for this visit.There is no height or weight on file to calculate BMI.  General Appearance: NA  Eye Contact:  NA  Speech:  Clear and Coherent  Volume:  Normal  Mood:  Euthymic  Affect:  NA  Thought Process:  Goal Directed  Orientation:  Full (Time, Place, and Person)  Thought Content: Rumination   Suicidal Thoughts:  No  Homicidal Thoughts:  No  Memory:  Immediate;   Good Recent;   Good Remote;   Good  Judgement:  Good  Insight:  Fair  Psychomotor Activity:  Normal  Concentration:  Concentration: Good and Attention Span: Good  Recall:  Good  Fund of Knowledge: Good  Language: Good  Akathisia:  No  Handed:  Right  AIMS (if indicated): not done  Assets:  Communication Skills Desire for Improvement Resilience Social Support Talents/Skills  ADL's:  Intact  Cognition: WNL  Sleep:  Good   Screenings: GAD-7    Flowsheet Row Office Visit from 08/28/2020 in [facility] Office Visit from 08/10/2020 in [facility]  Total GAD-7 Score 0 0      PHQ2-9    Flowsheet Row Office Visit from 08/28/2020 in [facility] Office Visit from 08/10/2020 in [facility] Office Visit from 06/01/2020 in [facility] Office Visit from 08/24/2019 in [facility] Office Visit from 08/02/2019 in [facility]  PHQ-2 Total Score 0 0 0 0 0  PHQ-9 Total Score 0 0 -- -- --        Assessment and Plan: This patient is a 61 year old female with a past episode of psychosis.  [name] maintained on Abilify 1 mg daily without relapse or side effect.  She will continue this dosage and return to see me in 6 months   [name], MD 04/08/2021, 9:25 AM

## 2021-04-15 ENCOUNTER — Telehealth (HOSPITAL_COMMUNITY): Payer: Self-pay | Admitting: Psychiatry

## 2021-04-15 NOTE — Telephone Encounter (Signed)
Called to schedule f/u appt left vm 

## 2021-04-18 ENCOUNTER — Telehealth (HOSPITAL_COMMUNITY): Payer: BC Managed Care – PPO | Admitting: Psychiatry

## 2021-04-23 ENCOUNTER — Ambulatory Visit (INDEPENDENT_AMBULATORY_CARE_PROVIDER_SITE_OTHER): Payer: BC Managed Care – PPO

## 2021-04-23 DIAGNOSIS — I442 Atrioventricular block, complete: Secondary | ICD-10-CM

## 2021-04-23 LAB — CUP PACEART REMOTE DEVICE CHECK
Battery Remaining Longevity: 56 mo
Battery Remaining Percentage: 44 %
Battery Voltage: 2.95 V
Brady Statistic AP VP Percent: 12 %
Brady Statistic AP VS Percent: 1 %
Brady Statistic AS VP Percent: 88 %
Brady Statistic AS VS Percent: 1 %
Brady Statistic RA Percent Paced: 12 %
Brady Statistic RV Percent Paced: 99 %
Date Time Interrogation Session: 20230228030805
Implantable Lead Implant Date: 20170323
Implantable Lead Implant Date: 20170323
Implantable Lead Location: 753859
Implantable Lead Location: 753860
Implantable Pulse Generator Implant Date: 20170323
Lead Channel Impedance Value: 430 Ohm
Lead Channel Impedance Value: 530 Ohm
Lead Channel Pacing Threshold Amplitude: 0.625 V
Lead Channel Pacing Threshold Amplitude: 0.875 V
Lead Channel Pacing Threshold Pulse Width: 0.4 ms
Lead Channel Pacing Threshold Pulse Width: 0.4 ms
Lead Channel Sensing Intrinsic Amplitude: 12 mV
Lead Channel Sensing Intrinsic Amplitude: 2.4 mV
Lead Channel Setting Pacing Amplitude: 1.125
Lead Channel Setting Pacing Amplitude: 1.625
Lead Channel Setting Pacing Pulse Width: 0.4 ms
Lead Channel Setting Sensing Sensitivity: 4 mV
Pulse Gen Model: 2272
Pulse Gen Serial Number: 3134337

## 2021-04-30 NOTE — Progress Notes (Signed)
Remote pacemaker transmission.   

## 2021-05-15 ENCOUNTER — Encounter: Payer: Self-pay | Admitting: Nurse Practitioner

## 2021-05-15 ENCOUNTER — Ambulatory Visit: Payer: BC Managed Care – PPO | Admitting: Nurse Practitioner

## 2021-05-15 VITALS — BP 138/82 | HR 86 | Temp 98.6°F | Ht 64.0 in | Wt 249.0 lb

## 2021-05-15 DIAGNOSIS — J0100 Acute maxillary sinusitis, unspecified: Secondary | ICD-10-CM

## 2021-05-15 MED ORDER — GUAIFENESIN ER 600 MG PO TB12: 600.0000 mg | ORAL_TABLET | Freq: Two times a day (BID) | ORAL | 0 refills | Status: DC

## 2021-05-15 MED ORDER — DOXYCYCLINE HYCLATE 100 MG PO TABS: 100.0000 mg | ORAL_TABLET | Freq: Two times a day (BID) | ORAL | 0 refills | Status: DC

## 2021-05-15 MED ORDER — FLUTICASONE PROPIONATE 50 MCG/ACT NA SUSP: 2.0000 | Freq: Every day | NASAL | 6 refills | Status: DC

## 2021-05-15 NOTE — Progress Notes (Signed)
Acute Office Visit  Subjective:    Patient ID: Alejandra Harrison, female    DOB: [birth year], 61 y.o.   MRN: [id number]  Chief Complaint  Patient presents with   cough, congestion    Sinusitis This is a new problem. Episode onset: in the pas 3-4 days. The problem has been gradually worsening since onset. The fever has been present for Less than 1 day. The pain is moderate. Associated symptoms include congestion, coughing, headaches and sinus pressure. Pertinent negatives include no chills, ear pain, shortness of breath or sore throat.  Cough This is a new problem. The current episode started yesterday. The problem has been gradually worsening. The problem occurs constantly. The cough is Non-productive. Associated symptoms include headaches and nasal congestion. Pertinent negatives include no chest pain, chills, ear pain, fever, rash, rhinorrhea, sore throat or shortness of breath.     Past Medical History:  Diagnosis Date   Anxiety    AV block, complete (HCC)    History of seasonal allergies    Psychosis (HCC)     Past Surgical History:  Procedure Laterality Date   COLONOSCOPY N/A 09/28/2015   Procedure: COLONOSCOPY;  Surgeon: West Bali, MD;  [facility];  Service: Endoscopy;  Laterality: N/A;  1030   EP IMPLANTABLE DEVICE N/A 05/17/2015   Procedure: Pacemaker Implant;  Surgeon: Will Jorja Loa, MD;  [facility];  Service: Cardiovascular;  Laterality: N/A;   INSERT / REPLACE / REMOVE PACEMAKER  05/17/2015   TUBAL LIGATION      Family History  Problem Relation Age of Onset   Dementia Mother    Diabetes Father    Breast cancer Paternal Aunt    ADD / ADHD Neg Hx    Alcohol abuse Neg Hx    Drug abuse Neg Hx    Anxiety disorder Neg Hx    Bipolar disorder Neg Hx    Depression Neg Hx    OCD Neg Hx    Paranoid behavior Neg Hx    Schizophrenia Neg Hx    Seizures Neg Hx    Sexual abuse Neg Hx    Physical abuse Neg Hx    Colon cancer Neg Hx      Social History   Socioeconomic History   Marital status: Married    Spouse name: Not on file   Number of children: Not on file   Years of education: Not on file   Highest education level: Not on file  Occupational History   Not on file  Tobacco Use   Smoking status: Never   Smokeless tobacco: Never   Tobacco comments:    Never smoked  Vaping Use   Vaping Use: Never used  Substance and Sexual Activity   Alcohol use: No    Alcohol/week: 0.0 standard drinks   Drug use: No   Sexual activity: Not Currently    Birth control/protection: Post-menopausal  Other Topics Concern   Not on file  Social History Narrative   Not on file   Social Determinants of Health   Financial Resource Strain: Not on file  Food Insecurity: Not on file  Transportation Needs: Not on file  Physical Activity: Not on file  Stress: Not on file  Social Connections: Not on file  Intimate Partner Violence: Not on file    Outpatient Medications Prior to Visit  Medication Sig Dispense Refill   ARIPiprazole (ABILIFY) 2 MG tablet Take 0.5 tablets (1 mg total) by mouth daily. 30 tablet  5   cetirizine (ZYRTEC) 10 MG tablet Take 1 tablet (10 mg total) by mouth daily. 30 tablet 11   metoprolol tartrate (LOPRESSOR) 50 MG tablet Take 1 tablet (50 mg total) by mouth 2 (two) times daily. 180 tablet 2   Vitamin D, Ergocalciferol, (DRISDOL) 1.25 MG (50000 UNIT) CAPS capsule Take 1 capsule (50,000 Units total) by mouth once a week. 12 capsule 3   fluticasone (FLONASE) 50 MCG/ACT nasal spray Place 2 sprays into both nostrils daily. 16 g 6   No facility-administered medications prior to visit.    Allergies  Allergen Reactions   Oxycodone-Acetaminophen Hives and Other (See Comments)    Hands peeled   Penicillins Hives and Other (See Comments)    Hands peeled    Review of Systems  Constitutional: Negative.  Negative for chills and fever.  HENT:  Positive for congestion and sinus pressure. Negative for ear  pain, rhinorrhea and sore throat.   Eyes: Negative.   Respiratory:  Positive for cough. Negative for shortness of breath.   Cardiovascular: Negative.  Negative for chest pain.  Gastrointestinal: Negative.   Genitourinary: Negative.   Skin: Negative.  Negative for rash.  Neurological:  Positive for headaches.  All other systems reviewed and are negative.     Objective:    Physical Exam Vitals and nursing note reviewed.  Constitutional:      Appearance: Normal appearance.  HENT:     Head: Normocephalic.     Right Ear: External ear normal.     Left Ear: External ear normal.     Nose: Congestion present.     Mouth/Throat:     Mouth: Mucous membranes are moist.     Pharynx: Oropharynx is clear.  Eyes:     Conjunctiva/sclera: Conjunctivae normal.  Cardiovascular:     Rate and Rhythm: Normal rate and regular rhythm.     Pulses: Normal pulses.     Heart sounds: Normal heart sounds.  Pulmonary:     Effort: Pulmonary effort is normal.     Breath sounds: Normal breath sounds.  Abdominal:     General: Bowel sounds are normal.  Skin:    General: Skin is warm.     Findings: No rash.  Neurological:     Mental Status: [name] alert and oriented to person, place, and time.    BP 138/82   Pulse 86   Temp 98.6 F (37 C)   Ht 5\' 4"  (1.626 m)   Wt 249 lb (112.9 kg)   SpO2 95%   BMI 42.74 kg/m  Wt Readings from Last 3 Encounters:  05/15/21 249 lb (112.9 kg)  08/28/20 248 lb 12.8 oz (112.9 kg)  08/10/20 250 lb (113.4 kg)    Health Maintenance Due  Topic Date Due   COVID-19 Vaccine (4 - Booster for Pfizer series) 04/16/2020   INFLUENZA VACCINE  09/24/2020   Zoster Vaccines- Shingrix (2 of 2) 10/23/2020    There are no preventive care reminders to display for this patient.   Lab Results  Component Value Date   TSH 2.470 08/28/2020   Lab Results  Component Value Date   WBC 7.1 08/28/2020   HGB 14.5 08/28/2020   HCT 44.6 08/28/2020   MCV 90 08/28/2020   PLT 291  08/28/2020   Lab Results  Component Value Date   NA 140 08/28/2020   K 4.9 08/28/2020   CO2 22 08/28/2020   GLUCOSE 87 08/28/2020   BUN 9 08/28/2020   CREATININE 0.73 08/28/2020  BILITOT 0.4 08/28/2020   ALKPHOS 79 08/28/2020   AST 18 08/28/2020   ALT 29 08/28/2020   PROT 6.7 08/28/2020   ALBUMIN 4.7 08/28/2020   CALCIUM 9.1 08/28/2020   EGFR 94 08/28/2020   Lab Results  Component Value Date   CHOL 162 08/28/2020   Lab Results  Component Value Date   HDL 51 08/28/2020   Lab Results  Component Value Date   LDLCALC 94 08/28/2020   Lab Results  Component Value Date   TRIG 94 08/28/2020   Lab Results  Component Value Date   CHOLHDL 3.2 08/28/2020   No results found for: HGBA1C     Assessment & Plan:  Take meds as prescribed - Use a cool mist humidifier  -Use saline nose sprays frequently -Force fluids -For fever or aches or pains- take Tylenol or ibuprofen. -Doxycycline 100 mg tablet twice daily -Mucinex for cough and congestion. -If symptoms do not improve, she may need to be COVID tested to rule this out Follow up with worsening unresolved symptoms  Problem List Items Addressed This Visit   None Visit Diagnoses     Subacute maxillary sinusitis    -  Primary   Relevant Medications   doxycycline (VIBRA-TABS) 100 MG tablet   fluticasone (FLONASE) 50 MCG/ACT nasal spray   guaiFENesin (MUCINEX) 600 MG 12 hr tablet        Meds ordered this encounter  Medications   doxycycline (VIBRA-TABS) 100 MG tablet    Sig: Take 1 tablet (100 mg total) by mouth 2 (two) times daily.    Dispense:  14 tablet    Refill:  0    Order Specific Question:   Supervising Provider    Answer:   Mechele Claude [[id number]]   fluticasone (FLONASE) 50 MCG/ACT nasal spray    Sig: Place 2 sprays into both nostrils daily.    Dispense:  16 g    Refill:  6    Order Specific Question:   Supervising Provider    Answer:   Mechele Claude [[id number]]   guaiFENesin (MUCINEX) 600 MG 12 hr  tablet    Sig: Take 1 tablet (600 mg total) by mouth 2 (two) times daily.    Dispense:  30 tablet    Refill:  0    Order Specific Question:   Supervising Provider    Answer:   Mechele Claude [[id number]]     Daryll Drown, NP

## 2021-05-15 NOTE — Patient Instructions (Signed)

## 2021-07-23 ENCOUNTER — Ambulatory Visit (INDEPENDENT_AMBULATORY_CARE_PROVIDER_SITE_OTHER): Payer: BC Managed Care – PPO

## 2021-07-23 DIAGNOSIS — I442 Atrioventricular block, complete: Secondary | ICD-10-CM

## 2021-07-23 LAB — CUP PACEART REMOTE DEVICE CHECK
Battery Remaining Longevity: 53 mo
Battery Remaining Percentage: 42 %
Battery Voltage: 2.95 V
Brady Statistic AP VP Percent: 12 %
Brady Statistic AP VS Percent: 1 %
Brady Statistic AS VP Percent: 88 %
Brady Statistic AS VS Percent: 1 %
Brady Statistic RA Percent Paced: 12 %
Brady Statistic RV Percent Paced: 99 %
Date Time Interrogation Session: 20230530020028
Implantable Lead Implant Date: 20170323
Implantable Lead Implant Date: 20170323
Implantable Lead Location: 753859
Implantable Lead Location: 753860
Implantable Pulse Generator Implant Date: 20170323
Lead Channel Impedance Value: 410 Ohm
Lead Channel Impedance Value: 550 Ohm
Lead Channel Pacing Threshold Amplitude: 0.5 V
Lead Channel Pacing Threshold Amplitude: 1 V
Lead Channel Pacing Threshold Pulse Width: 0.4 ms
Lead Channel Pacing Threshold Pulse Width: 0.4 ms
Lead Channel Sensing Intrinsic Amplitude: 12 mV
Lead Channel Sensing Intrinsic Amplitude: 3.4 mV
Lead Channel Setting Pacing Amplitude: 1.25 V
Lead Channel Setting Pacing Amplitude: 1.5 V
Lead Channel Setting Pacing Pulse Width: 0.4 ms
Lead Channel Setting Sensing Sensitivity: 4 mV
Pulse Gen Model: 2272
Pulse Gen Serial Number: 3134337

## 2021-07-29 ENCOUNTER — Other Ambulatory Visit: Payer: Self-pay | Admitting: Cardiology

## 2021-08-05 ENCOUNTER — Ambulatory Visit (INDEPENDENT_AMBULATORY_CARE_PROVIDER_SITE_OTHER): Payer: BC Managed Care – PPO | Admitting: Cardiology

## 2021-08-05 ENCOUNTER — Encounter: Payer: Self-pay | Admitting: Cardiology

## 2021-08-05 VITALS — BP 112/76 | HR 62 | Ht 64.0 in | Wt 250.0 lb

## 2021-08-05 DIAGNOSIS — I442 Atrioventricular block, complete: Secondary | ICD-10-CM | POA: Diagnosis not present

## 2021-08-05 MED ORDER — METOPROLOL TARTRATE 50 MG PO TABS: 50.0000 mg | ORAL_TABLET | Freq: Two times a day (BID) | ORAL | 3 refills | Status: DC

## 2021-08-05 NOTE — Progress Notes (Signed)
Electrophysiology Office Note   Date:  08/05/2021   ID:  MARIYANA FULOP, DOB 1960/09/30, MRN [id number]  PCP:  Junie Spencer, FNP  Primary Electrophysiologist:  Regan Lemming, MD    No chief complaint on file.     History of Present Illness: Alejandra Harrison is a 61 y.o. female who presents today for electrophysiology evaluation.     She has a history significant for complete heart block.  [name] status post Abbott dual-chamber pacemaker implanted 05/17/2015.  She was noted to have nonsustained VT on pacemaker interrogation and was started on metoprolol.  Today, denies symptoms of palpitations, chest pain, shortness of breath, orthopnea, PND, lower extremity edema, claudication, dizziness, presyncope, syncope, bleeding, or neurologic sequela. The patient is tolerating medications without difficulties.     Past Medical History:  Diagnosis Date   Anxiety    AV block, complete (HCC)    History of seasonal allergies    Psychosis (HCC)    Past Surgical History:  Procedure Laterality Date   COLONOSCOPY N/A 09/28/2015   Procedure: COLONOSCOPY;  Surgeon: West Bali, MD;  [facility];  Service: Endoscopy;  Laterality: N/A;  1030   EP IMPLANTABLE DEVICE N/A 05/17/2015   Procedure: Pacemaker Implant;  Surgeon: Lucresia Simic Jorja Loa, MD;  [facility];  Service: Cardiovascular;  Laterality: N/A;   INSERT / REPLACE / REMOVE PACEMAKER  05/17/2015   TUBAL LIGATION       Current Outpatient Medications  Medication Sig Dispense Refill   ARIPiprazole (ABILIFY) 2 MG tablet Take 0.5 tablets (1 mg total) by mouth daily. 30 tablet 5   metoprolol tartrate (LOPRESSOR) 50 MG tablet Take 1 tablet (50 mg total) by mouth 2 (two) times daily. 180 tablet 3   No current facility-administered medications for this visit.    Allergies:   Oxycodone-acetaminophen and Penicillins   Social History:  The patient  reports that she has never smoked. She has never used smokeless  tobacco. She reports that she does not drink alcohol and does not use drugs.   Family History:  The patient's family history includes Breast cancer in her paternal aunt; Dementia in her mother; Diabetes in her father.   ROS:  Please see the history of present illness.   Otherwise, review of systems is positive for none.   All other systems are reviewed and negative.   PHYSICAL EXAM: VS:  BP 112/76 (BP Location: Left Arm, Patient Position: Sitting, Cuff Size: Large)   Pulse 62   Ht 5\' 4"  (1.626 m)   Wt 250 lb (113.4 kg)   BMI 42.91 kg/m  , BMI Body mass index is 42.91 kg/m. GEN: Well nourished, well developed, in no acute distress  HEENT: normal  Neck: no JVD, carotid bruits, or masses Cardiac: RRR; no murmurs, rubs, or gallops,no edema  Respiratory:  clear to auscultation bilaterally, normal work of breathing GI: soft, nontender, nondistended, + BS MS: no deformity or atrophy  Skin: warm and dry, device site well healed Neuro:  Strength and sensation are intact Psych: euthymic mood, full affect  EKG:  EKG is ordered today. Personal review of the ekg ordered shows sinus rhythm, V paced  Personal review of the device interrogation today. Results in [facility]   Recent Labs: 08/28/2020: ALT 29; BUN 9; Creatinine, Ser 0.73; Hemoglobin 14.5; Platelets 291; Potassium 4.9; Sodium 140; TSH 2.470   Lipid Panel     Component Value Date/Time   CHOL 162 08/28/2020 0933  TRIG 94 08/28/2020 0933   HDL 51 08/28/2020 0933   CHOLHDL 3.2 08/28/2020 0933   LDLCALC 94 08/28/2020 0933   Wt Readings from Last 3 Encounters:  08/05/21 250 lb (113.4 kg)  05/15/21 249 lb (112.9 kg)  08/28/20 248 lb 12.8 oz (112.9 kg)    TTE 07/16/16 - Left ventricle: The cavity size was normal. Wall thickness was   increased increased in a pattern of mild to moderate LVH.   Systolic function was normal. The estimated ejection fraction was   in the range of 60% to 65%. Doppler parameters are consistent   with  abnormal left ventricular relaxation (grade 1 diastolic   dysfunction). - Aortic valve: Mildly calcified annulus. Trileaflet; normal   thickness leaflets. Valve area (VTI): 2.55 cm^2. Valve area   (Vmax): 2.18 cm^2. Valve area (Vmean): 2.17 cm^2. - Technically adequate study.  ASSESSMENT AND PLAN:  1.  Complete heart block: Status post Saint Jude dual-chamber pacemaker implanted 05/17/2015.  Device function appropriately.  No changes at this time.  2.  Nonsustained VT: Currently on metoprolol.  No further episodes.  No further changes.   Current med no medicines are reviewed at length with the patient today.   The patient does not have concerns regarding her medicines.  The following changes were made today: none  Labs/ tests ordered today include:  Orders Placed This Encounter  Procedures   EKG 12-Lead    Disposition:   FU 12 months  Signed, Jancarlo Biermann Jorja Loa, MD  08/05/2021 10:01 AM     [facility] [address] [place] [address] [phone] (office) [phone] (fax)

## 2021-08-08 NOTE — Progress Notes (Signed)
Remote pacemaker transmission.   

## 2021-08-22 ENCOUNTER — Other Ambulatory Visit: Payer: Self-pay | Admitting: Family

## 2021-08-22 DIAGNOSIS — Z1231 Encounter for screening mammogram for malignant neoplasm of breast: Secondary | ICD-10-CM

## 2021-08-30 ENCOUNTER — Other Ambulatory Visit (HOSPITAL_COMMUNITY)
Admission: RE | Admit: 2021-08-30 | Discharge: 2021-08-30 | Disposition: A | Discharge status: Home / Self Care | Payer: BC Managed Care – PPO | Source: Ambulatory Visit | Attending: Family | Admitting: Family

## 2021-08-30 ENCOUNTER — Ambulatory Visit (INDEPENDENT_AMBULATORY_CARE_PROVIDER_SITE_OTHER): Payer: BC Managed Care – PPO | Admitting: Family

## 2021-08-30 ENCOUNTER — Encounter: Payer: Self-pay | Admitting: Family

## 2021-08-30 VITALS — BP 127/73 | HR 69 | Temp 97.0°F | Ht 64.0 in | Wt 250.0 lb

## 2021-08-30 DIAGNOSIS — Z01419 Encounter for gynecological examination (general) (routine) without abnormal findings: Secondary | ICD-10-CM | POA: Insufficient documentation

## 2021-08-30 DIAGNOSIS — Z23 Encounter for immunization: Secondary | ICD-10-CM | POA: Diagnosis not present

## 2021-08-30 DIAGNOSIS — F5105 Insomnia due to other mental disorder: Secondary | ICD-10-CM | POA: Diagnosis not present

## 2021-08-30 DIAGNOSIS — K5900 Constipation, unspecified: Secondary | ICD-10-CM

## 2021-08-30 DIAGNOSIS — K219 Gastro-esophageal reflux disease without esophagitis: Secondary | ICD-10-CM | POA: Diagnosis not present

## 2021-08-30 DIAGNOSIS — Z0001 Encounter for general adult medical examination with abnormal findings: Secondary | ICD-10-CM | POA: Diagnosis not present

## 2021-08-30 DIAGNOSIS — Z0000 Encounter for general adult medical examination without abnormal findings: Secondary | ICD-10-CM | POA: Diagnosis present

## 2021-08-30 DIAGNOSIS — I442 Atrioventricular block, complete: Secondary | ICD-10-CM | POA: Diagnosis not present

## 2021-08-30 DIAGNOSIS — E559 Vitamin D deficiency, unspecified: Secondary | ICD-10-CM

## 2021-08-30 DIAGNOSIS — E538 Deficiency of other specified B group vitamins: Secondary | ICD-10-CM

## 2021-08-30 DIAGNOSIS — F320 Major depressive disorder, single episode, mild: Secondary | ICD-10-CM

## 2021-08-30 DIAGNOSIS — F411 Generalized anxiety disorder: Secondary | ICD-10-CM

## 2021-08-30 NOTE — Addendum Note (Signed)
Addended by: Adella Hare B on: 08/30/2021 11:37 AM   Modules accepted: Orders

## 2021-08-30 NOTE — Patient Instructions (Signed)

## 2021-08-30 NOTE — Progress Notes (Signed)
Subjective:    Patient ID: Alejandra Harrison, female    DOB: 1960-10-27, 61 y.o.   MRN: [id number]  Chief Complaint  Patient presents with   Annual Exam   Pt presents to the office today for CPE with pap. [name] followed by Cardiologists annually for heart block and has a pacemaker. [name] followed by [facility] every 6 months for Depression and GAD.   Gastroesophageal Reflux She complains of belching and heartburn. This is a chronic problem. The current episode started more than 1 year ago. The problem occurs occasionally. The problem has been waxing and waning. Risk factors include obesity. She has tried a PPI for the symptoms. The treatment provided moderate relief.  Constipation This is a chronic problem. The current episode started more than 1 year ago. The problem has been gradually worsening since onset. Her stool frequency is 1 time per day. Risk factors include obesity. She has tried diet changes for the symptoms. The treatment provided moderate relief.  Gynecologic Exam The patient's pertinent negatives include no genital itching or vaginal discharge. This is a chronic problem. The current episode started more than 1 year ago. The problem occurs intermittently. Associated symptoms include constipation.      Review of Systems  Gastrointestinal:  Positive for constipation and heartburn.  Genitourinary:  Negative for vaginal discharge.  All other systems reviewed and are negative.      Objective:   Physical Exam Vitals reviewed.  Constitutional:      General: [name] not in acute distress.    Appearance: [name] well-developed. [name] obese.  HENT:     Head: Normocephalic and atraumatic.     Right Ear: Tympanic membrane normal.     Left Ear: Tympanic membrane normal.  Eyes:     Pupils: Pupils are equal, round, and reactive to light.  Neck:     Thyroid: No thyromegaly.  Cardiovascular:     Rate and Rhythm: Normal rate and regular rhythm.     Heart sounds: Normal heart  sounds. No murmur heard. Pulmonary:     Effort: Pulmonary effort is normal. No respiratory distress.     Breath sounds: Normal breath sounds. No wheezing.  Abdominal:     General: Bowel sounds are normal. There is no distension.     Palpations: Abdomen is soft.     Tenderness: There is no abdominal tenderness.  Musculoskeletal:        General: No tenderness. Normal range of motion.     Cervical back: Normal range of motion and neck supple.  Skin:    General: Skin is warm and dry.  Neurological:     Mental Status: [name] alert and oriented to person, place, and time.     Cranial Nerves: No cranial nerve deficit.     Deep Tendon Reflexes: Reflexes are normal and symmetric.  Psychiatric:        Behavior: Behavior normal.        Thought Content: Thought content normal.        Judgment: Judgment normal.       BP 127/73   Pulse 69   Temp (!) 97 F (36.1 C)   Ht $R'5\' 4"'KW$  (1.626 m)   Wt 250 lb (113.4 kg)   SpO2 97%   BMI 42.91 kg/m      Assessment & Plan:  Alejandra Harrison comes in today with chief complaint of Annual Exam   Diagnosis and orders addressed:  1. Annual physical exam -  CMP14+EGFR - CBC with Differential/Platelet - Lipid panel - TSH - Vitamin B12 - VITAMIN D 25 Hydroxy (Vit-D Deficiency, Fractures) - Cytology - PAP([place])  2. Encounter for gynecological examination without abnormal finding - CMP14+EGFR - CBC with Differential/Platelet - Cytology - PAP([place])  3. CHB (complete heart block) (HCC) - CMP14+EGFR - CBC with Differential/Platelet  4. Gastroesophageal reflux disease, unspecified whether esophagitis present - CMP14+EGFR - CBC with Differential/Platelet  5. Insomnia due to mental disorder - CMP14+EGFR - CBC with Differential/Platelet  6. Vitamin D deficiency - CMP14+EGFR - CBC with Differential/Platelet - VITAMIN D 25 Hydroxy (Vit-D Deficiency, Fractures)  7. Constipation, unspecified constipation type - CMP14+EGFR - CBC  with Differential/Platelet  8. Morbid obesity ([place]) - CMP14+EGFR - CBC with Differential/Platelet  9. Vitamin B 12 deficiency - CMP14+EGFR - CBC with Differential/Platelet - Vitamin B12  10. Depression, major, single episode, mild (HCC) - CMP14+EGFR - CBC with Differential/Platelet  11. GAD (generalized anxiety disorder) - CMP14+EGFR - CBC with Differential/Platelet   Labs pending Health Maintenance reviewed Diet and exercise encouraged  Follow up plan: 6 month    Evelina Dun, FNP

## 2021-08-31 LAB — CMP14+EGFR
ALT: 36 IU/L — ABNORMAL HIGH (ref 0–32)
AST: 25 IU/L (ref 0–40)
Albumin/Globulin Ratio: 1.8 (ref 1.2–2.2)
Albumin: 4.6 g/dL (ref 3.8–4.8)
Alkaline Phosphatase: 83 IU/L (ref 44–121)
BUN/Creatinine Ratio: 10 — ABNORMAL LOW (ref 12–28)
BUN: 8 mg/dL (ref 8–27)
Bilirubin Total: 0.4 mg/dL (ref 0.0–1.2)
CO2: 19 mmol/L — ABNORMAL LOW (ref 20–29)
Calcium: 9.6 mg/dL (ref 8.7–10.3)
Chloride: 106 mmol/L (ref 96–106)
Creatinine, Ser: 0.8 mg/dL (ref 0.57–1.00)
Globulin, Total: 2.5 g/dL (ref 1.5–4.5)
Glucose: 94 mg/dL (ref 70–99)
Potassium: 5 mmol/L (ref 3.5–5.2)
Sodium: 142 mmol/L (ref 134–144)
Total Protein: 7.1 g/dL (ref 6.0–8.5)
eGFR: 83 mL/min/{1.73_m2} (ref >59)

## 2021-08-31 LAB — CBC WITH DIFFERENTIAL/PLATELET
Basophils Absolute: 0.1 10*3/uL (ref 0.0–0.2)
Basos: 1 %
EOS (ABSOLUTE): 0.3 10*3/uL (ref 0.0–0.4)
Eos: 4 %
Hematocrit: 45.5 % (ref 34.0–46.6)
Hemoglobin: 14.9 g/dL (ref 11.1–15.9)
Immature Grans (Abs): 0 10*3/uL (ref 0.0–0.1)
Immature Granulocytes: 0 %
Lymphocytes Absolute: 1.8 10*3/uL (ref 0.7–3.1)
Lymphs: 23 %
MCH: 28.9 pg (ref 26.6–33.0)
MCHC: 32.7 g/dL (ref 31.5–35.7)
MCV: 88 fL (ref 79–97)
Monocytes Absolute: 0.6 10*3/uL (ref 0.1–0.9)
Monocytes: 7 %
Neutrophils Absolute: 5.1 10*3/uL (ref 1.4–7.0)
Neutrophils: 65 %
Platelets: 309 10*3/uL (ref 150–450)
RBC: 5.15 x10E6/uL (ref 3.77–5.28)
RDW: 13.2 % (ref 11.7–15.4)
WBC: 7.9 10*3/uL (ref 3.4–10.8)

## 2021-08-31 LAB — LIPID PANEL
Chol/HDL Ratio: 3.8 ratio (ref 0.0–4.4)
Cholesterol, Total: 176 mg/dL (ref 100–199)
HDL: 46 mg/dL (ref >39)
LDL Chol Calc (NIH): 106 mg/dL — ABNORMAL HIGH (ref 0–99)
Triglycerides: 132 mg/dL (ref 0–149)
VLDL Cholesterol Cal: 24 mg/dL (ref 5–40)

## 2021-08-31 LAB — TSH: TSH: 2.42 u[IU]/mL (ref 0.450–4.500)

## 2021-08-31 LAB — VITAMIN B12: Vitamin B-12: 255 pg/mL (ref 232–1245)

## 2021-08-31 LAB — VITAMIN D 25 HYDROXY (VIT D DEFICIENCY, FRACTURES): Vit D, 25-Hydroxy: 33.7 ng/mL (ref 30.0–100.0)

## 2021-09-02 LAB — CYTOLOGY - PAP
Adequacy: ABSENT
Diagnosis: NEGATIVE

## 2021-09-15 IMAGING — MG MM DIGITAL SCREENING BILAT W/ TOMO AND CAD
8 series · 8 of 24 positions shown · non-contrast
Comparison: Previous exam(s).

CLINICAL DATA: Screening.

EXAM:
DIGITAL SCREENING BILATERAL MAMMOGRAM WITH TOMOSYNTHESIS AND CAD
TECHNIQUE: Bilateral screening digital craniocaudal and mediolateral oblique
mammograms were obtained. Bilateral screening digital breast
tomosynthesis was performed. The images were evaluated with
computer-aided detection.

[L CC synth-2D]
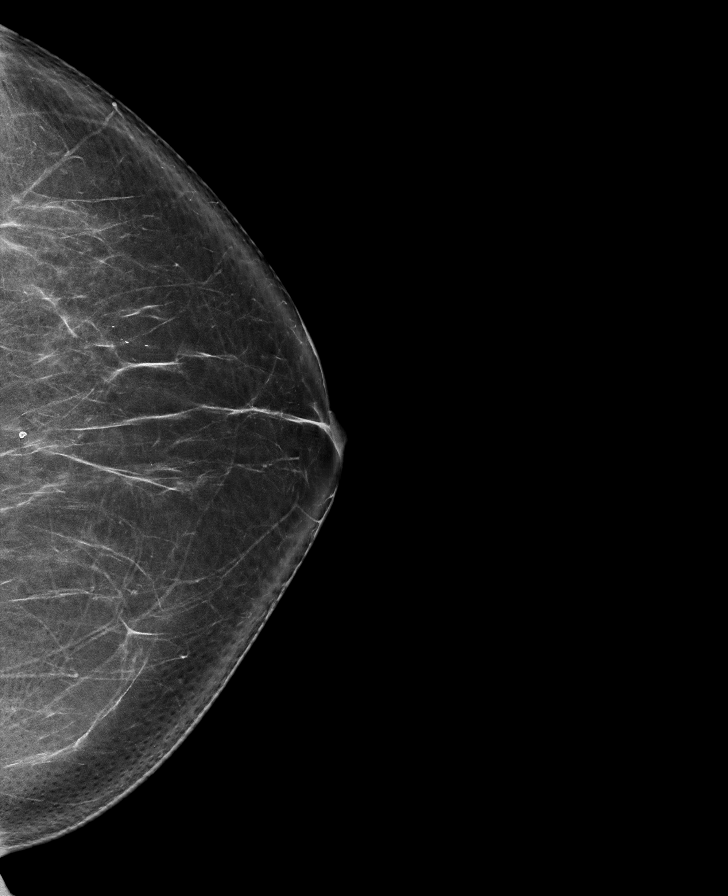

[R MLO synth-2D]
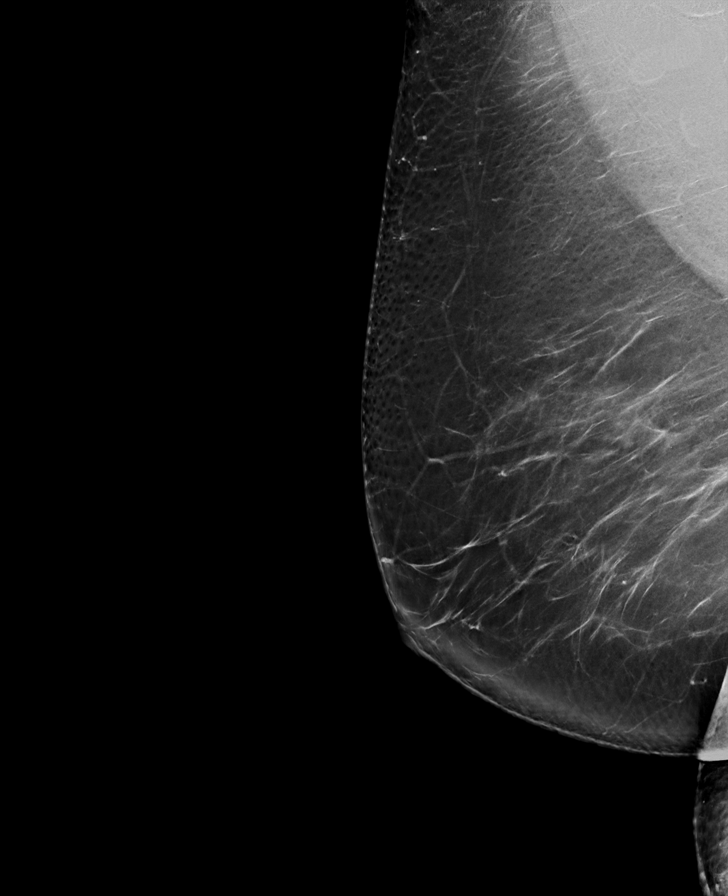

[L MLO synth-2D]
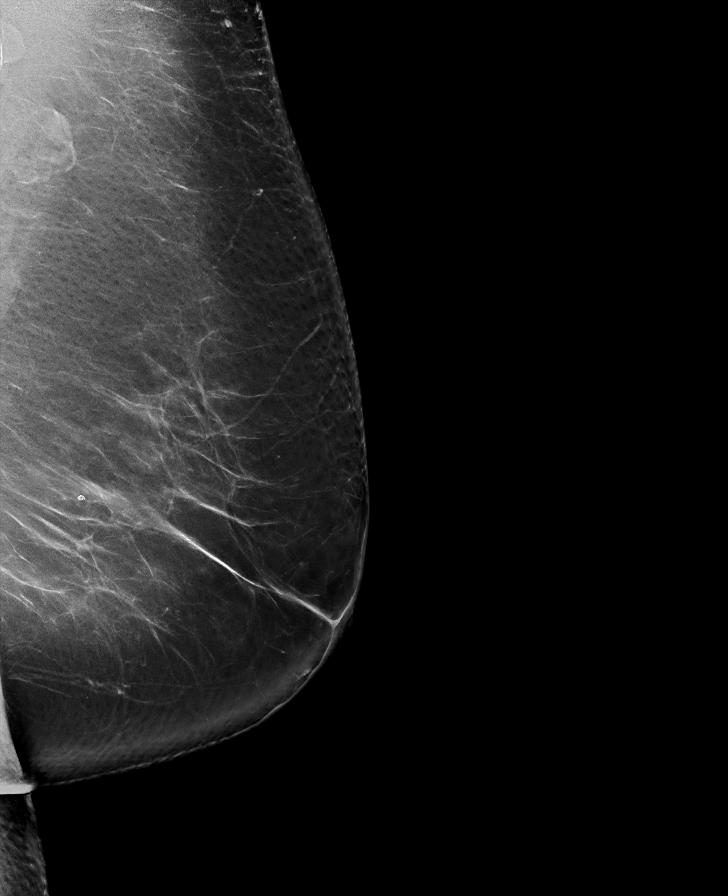

[R CC synth-2D]
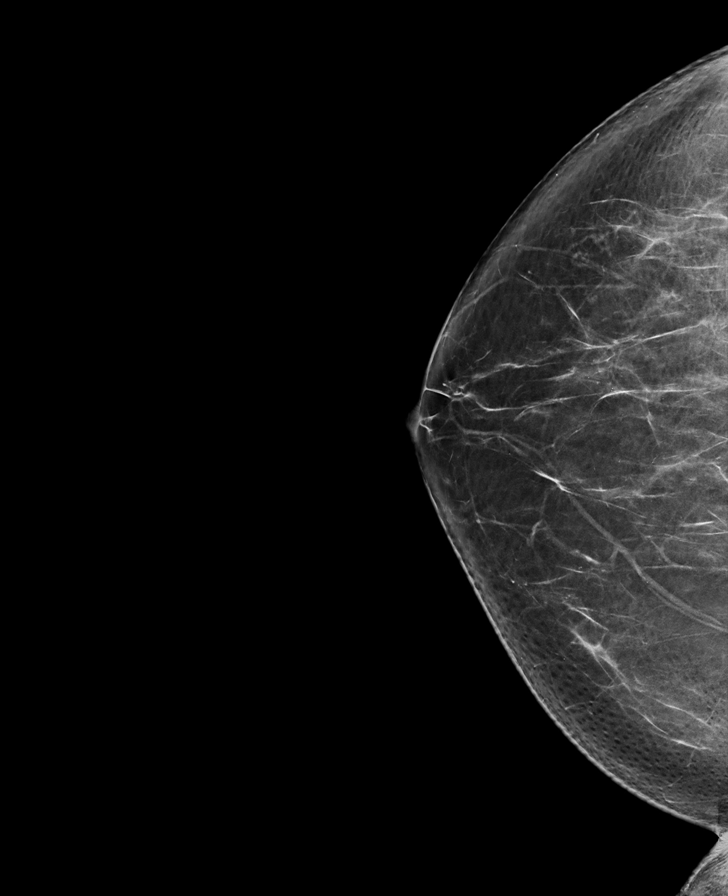

[R CC tomo · tomo slice 41/82.0]
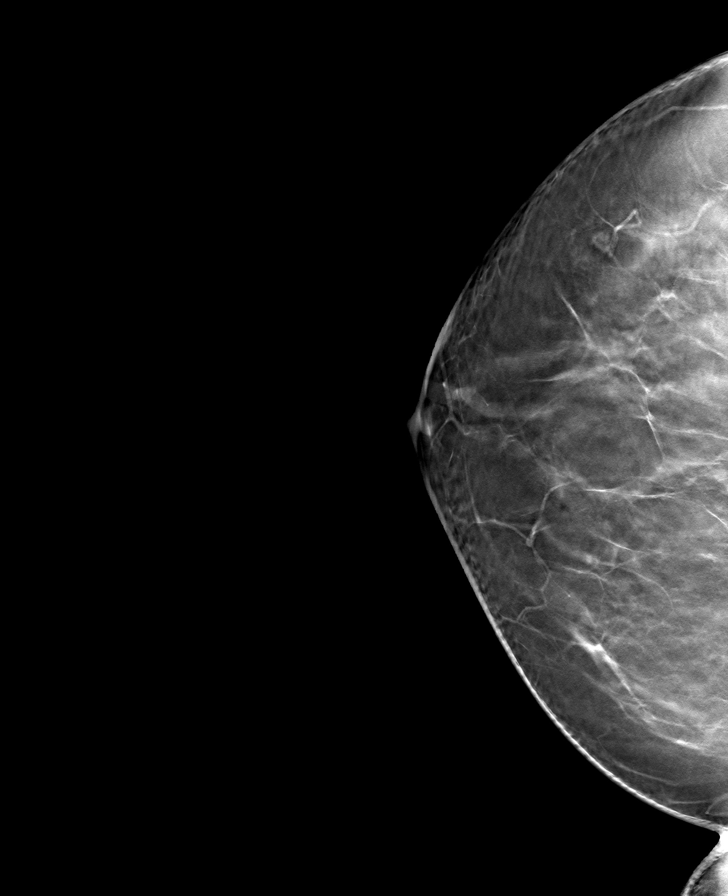

[L CC tomo · tomo slice 43/85.0]
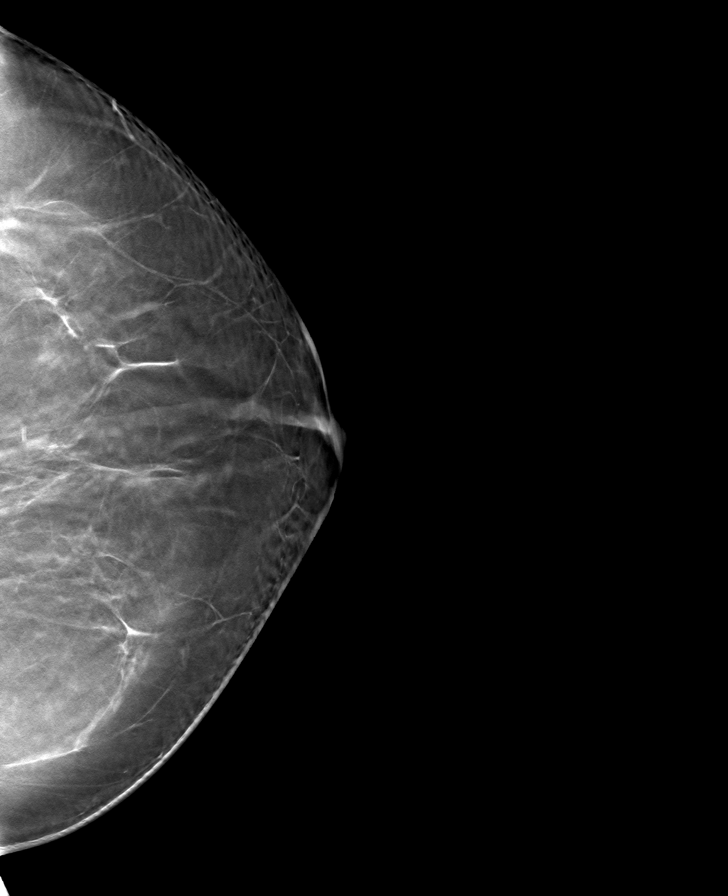

[R MLO tomo · tomo slice 50/99.0]
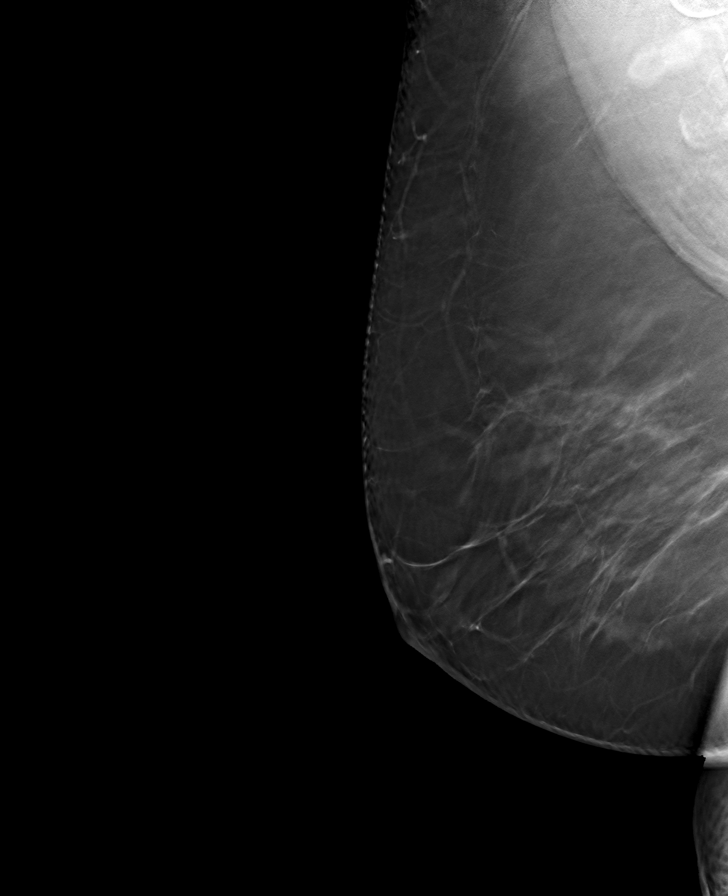

[L MLO tomo · tomo slice 51/102.0]
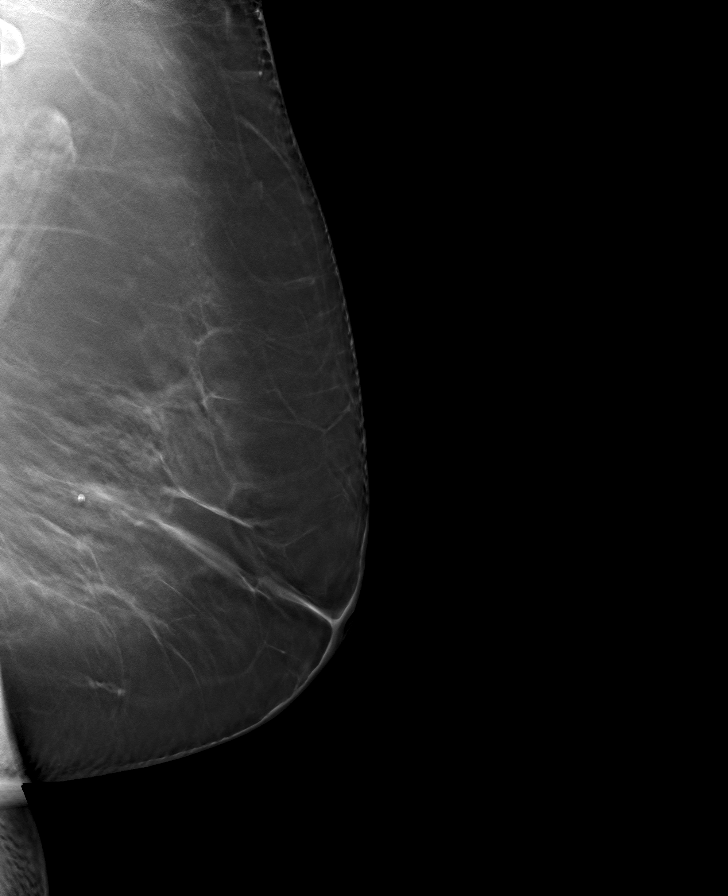

[8 of 24 positions shown; findings below may reference images not displayed]

ACR Breast Density Category b: There are scattered areas of
fibroglandular density.
FINDINGS: There are no findings suspicious for malignancy.
IMPRESSION: No mammographic evidence of malignancy. A result letter of this
screening mammogram will be mailed directly to the patient.

RECOMMENDATION:
Screening mammogram in one year. (Code:51-O-LD2)

BI-RADS CATEGORY  1: Negative.

## 2021-09-30 ENCOUNTER — Ambulatory Visit
Admission: RE | Admit: 2021-09-30 | Discharge: 2021-09-30 | Disposition: A | Discharge status: Home / Self Care | Payer: BC Managed Care – PPO | Source: Ambulatory Visit | Attending: Family | Admitting: Family

## 2021-09-30 DIAGNOSIS — Z1231 Encounter for screening mammogram for malignant neoplasm of breast: Secondary | ICD-10-CM

## 2021-10-16 ENCOUNTER — Telehealth (INDEPENDENT_AMBULATORY_CARE_PROVIDER_SITE_OTHER): Payer: BC Managed Care – PPO | Admitting: Psychiatry

## 2021-10-16 ENCOUNTER — Encounter (HOSPITAL_COMMUNITY): Payer: Self-pay | Admitting: Psychiatry

## 2021-10-16 DIAGNOSIS — F99 Mental disorder, not otherwise specified: Secondary | ICD-10-CM | POA: Diagnosis not present

## 2021-10-16 DIAGNOSIS — F5105 Insomnia due to other mental disorder: Secondary | ICD-10-CM

## 2021-10-16 MED ORDER — ARIPIPRAZOLE 2 MG PO TABS: 1.0000 mg | ORAL_TABLET | Freq: Every day | ORAL | 5 refills | Status: DC

## 2021-10-16 NOTE — Progress Notes (Signed)
Virtual Visit via Telephone Note  I connected with Alejandra Harrison on 10/16/21 at  9:00 AM EDT by telephone and verified that I am speaking with the correct person using two identifiers.  Location: Patient: home Provider: office   I discussed the limitations, risks, security and privacy concerns of performing an evaluation and management service by telephone and the availability of in person appointments. I also discussed with the patient that there may be a patient responsible charge related to this service. The patient expressed understanding and agreed to proceed.     I discussed the assessment and treatment plan with the patient. The patient was provided an opportunity to ask questions and all were answered. The patient agreed with the plan and demonstrated an understanding of the instructions.   The patient was advised to call back or seek an in-person evaluation if the symptoms worsen or if the condition fails to improve as anticipated.  I provided 15 minutes of non-face-to-face time during this encounter.   [name], MD  [facility] MD/PA/NP OP Progress Note  10/16/2021 9:15 AM Alejandra Harrison  MRN:  [id number]  Chief Complaint:  Chief Complaint  Patient presents with   Paranoid   Follow-up   HPI: This patient is a 61 year old married white female who lives with her husband and 2 sons in [place]. 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   The patient states that in 2012 she started seeing a figure that she thought may have been God. She also got fixated on a man in her Sunday school and thought that her husband would die and she would have to marry this man. She was having lots of religious visions. She claims that she had not been on any medication or using drugs or alcohol at that time. She had not had a head injury or any additional stress. She may of been going through menopause. She denied visual hallucinations but claims she felt "high and on top of the  world." She's never had a history of depression however.   At any rate she was seen here by Dr. Toni Arthurs and started on Abilify. She was on a higher dose such as 5 mg in the past and it caused akathisia. Currently she takes 1 mg per day and it seems to be working fairly well. She's no longer having visions or unusual thoughts   The patient returns for follow-up after 6 months.  She still continues to work for [employer].  [name] enjoying her job.  She recently had a physical and complete lab work and a mammogram and everything turned out normal.  [name] feeling well and her heart is in good shape now that she had the pacemaker put in.  She denies any delusional thoughts paranoia or hallucinations.  [name] sleeping well.  She denies depression she denies twitching jerking akathisia or other side effects from Abilify 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 ARIPiprazole (ABILIFY) 2 MG tablet      Past Psychiatric History: none  Past Medical History:  Past Medical History:  Diagnosis Date   Anxiety    AV block, complete (HCC)    History of seasonal allergies    Psychosis (HCC)     Past Surgical History:  Procedure Laterality Date   COLONOSCOPY N/A 09/28/2015   Procedure: COLONOSCOPY;  Surgeon: West Bali, MD;  Location: [facility] ENDO [facility]ITE;  Service:  Endoscopy;  Laterality: N/A;  1030   EP IMPLANTABLE DEVICE N/A 05/17/2015   Procedure: Pacemaker Implant;  Surgeon: Will Jorja Loa, MD;  Location: [facility] INVASIVE CV LAB;  Service: Cardiovascular;  Laterality: N/A;   INSERT / REPLACE / REMOVE PACEMAKER  05/17/2015   TUBAL LIGATION      Family Psychiatric History: See below  Family History:  Family History  Problem Relation Age of Onset   Dementia Mother    Diabetes Father    Breast cancer Paternal Aunt    ADD / ADHD Neg Hx    Alcohol abuse Neg Hx    Drug abuse Neg Hx    Anxiety disorder Neg Hx    Bipolar disorder Neg Hx    Depression Neg Hx    OCD  Neg Hx    Paranoid behavior Neg Hx    Schizophrenia Neg Hx    Seizures Neg Hx    Sexual abuse Neg Hx    Physical abuse Neg Hx    Colon cancer Neg Hx     Social History:  Social History   Socioeconomic History   Marital status: Married    Spouse name: Not on file   Number of children: Not on file   Years of education: Not on file   Highest education level: Not on file  Occupational History   Not on file  Tobacco Use   Smoking status: Never   Smokeless tobacco: Never   Tobacco comments:    Never smoked  Vaping Use   Vaping Use: Never used  Substance and Sexual Activity   Alcohol use: No    Alcohol/week: 0.0 standard drinks of alcohol   Drug use: No   Sexual activity: Not Currently    Birth control/protection: Post-menopausal  Other Topics Concern   Not on file  Social History Narrative   Not on file   Social Determinants of Health   Financial Resource Strain: Not on file  Food Insecurity: Not on file  Transportation Needs: Not on file  Physical Activity: Not on file  Stress: Not on file  Social Connections: Not on file    Allergies:  Allergies  Allergen Reactions   Oxycodone-Acetaminophen Hives and Other (See Comments)    Hands peeled   Penicillins Hives and Other (See Comments)    Hands peeled    Metabolic Disorder Labs: No results found for: "HGBA1C", "MPG" No results found for: "PROLACTIN" Lab Results  Component Value Date   CHOL 176 08/30/2021   TRIG 132 08/30/2021   HDL 46 08/30/2021   CHOLHDL 3.8 08/30/2021   LDLCALC 106 (H) 08/30/2021   LDLCALC 94 08/28/2020   Lab Results  Component Value Date   TSH 2.420 08/30/2021   TSH 2.470 08/28/2020    Therapeutic Level Labs: No results found for: "LITHIUM" No results found for: "VALPROATE" No results found for: "CBMZ"  Current Medications: Current Outpatient Medications  Medication Sig Dispense Refill   ARIPiprazole (ABILIFY) 2 MG tablet Take 0.5 tablets (1 mg total) by mouth daily. 30  tablet 5   metoprolol tartrate (LOPRESSOR) 50 MG tablet Take 1 tablet (50 mg total) by mouth 2 (two) times daily. 180 tablet 3   No current facility-administered medications for this visit.     Musculoskeletal: Strength & Muscle Tone: na Gait & Station: na Patient leans: N/A  Psychiatric Specialty Exam: Review of Systems  All other systems reviewed and are negative.   There were no vitals taken for this visit.There is no height or  weight on file to calculate BMI.  General Appearance: NA  Eye Contact:  NA  Speech:  normal  Volume:  Normal  Mood:  Euthymic  Affect:  NA  Thought Process:  Goal Directed  Orientation:  Full (Time, Place, and Person)  Thought Content: WDL   Suicidal Thoughts:  No  Homicidal Thoughts:  No  Memory:  Immediate;   Good Recent;   Good Remote;   Good  Judgement:  Good  Insight:  Fair  Psychomotor Activity:  Normal  Concentration:  Concentration: Good and Attention Span: Good  Recall:  Good  Fund of Knowledge: Good  Language: Good  Akathisia:  No  Handed:  Right  AIMS (if indicated): not done  Assets:  Communication Skills Desire for Improvement Physical Health Resilience Social Support Talents/Skills  ADL's:  Intact  Cognition: WNL  Sleep:  Good   Screenings: GAD-7    Flowsheet Row Office Visit from 05/15/2021 in [facility] Office Visit from 08/28/2020 in [facility] Office Visit from 08/10/2020 in [facility]  Total GAD-7 Score 0 0 0      PHQ2-9    Flowsheet Row Office Visit from 08/30/2021 in [facility] Office Visit from 05/15/2021 in [facility] Office Visit from 08/28/2020 in [facility] Office Visit from 08/10/2020 in [facility] Office Visit from 06/01/2020 in [facility]  PHQ-2 Total Score 0 0 0 0 0  PHQ-9 Total Score -- 0 0 0 --        Assessment  and Plan: This patient is a 61 year old female with a past episode of psychosis.  She has maintained well on Abilify 1 mg daily without relapse or side effect.  She will continue this dosage and return to see me in 6 months  Collaboration of Care: Collaboration of Care: Primary Care Provider AEB notes are shared with PCP through the epic system  Patient/Guardian was advised Release of Information must be obtained prior to any record release in order to collaborate their care with an outside provider. Patient/Guardian was advised if they have not already done so to contact the registration department to sign all necessary forms in order for [place] to release information regarding their care.   Consent: Patient/Guardian gives verbal consent for treatment and assignment of benefits for services provided during this visit. Patient/Guardian expressed understanding and agreed to proceed.    [name], MD 10/16/2021, 9:15 AM

## 2021-10-22 ENCOUNTER — Ambulatory Visit (INDEPENDENT_AMBULATORY_CARE_PROVIDER_SITE_OTHER): Payer: BC Managed Care – PPO

## 2021-10-22 DIAGNOSIS — I442 Atrioventricular block, complete: Secondary | ICD-10-CM | POA: Diagnosis not present

## 2021-10-22 LAB — CUP PACEART REMOTE DEVICE CHECK
Battery Remaining Longevity: 50 mo
Battery Remaining Percentage: 39 %
Battery Voltage: 2.93 V
Brady Statistic AP VP Percent: 13 %
Brady Statistic AP VS Percent: 1 %
Brady Statistic AS VP Percent: 87 %
Brady Statistic AS VS Percent: 1 %
Brady Statistic RA Percent Paced: 13 %
Brady Statistic RV Percent Paced: 99 %
Date Time Interrogation Session: 20230829030339
Implantable Lead Implant Date: 20170323
Implantable Lead Implant Date: 20170323
Implantable Lead Location: 753859
Implantable Lead Location: 753860
Implantable Pulse Generator Implant Date: 20170323
Lead Channel Impedance Value: 390 Ohm
Lead Channel Impedance Value: 550 Ohm
Lead Channel Pacing Threshold Amplitude: 0.5 V
Lead Channel Pacing Threshold Amplitude: 0.875 V
Lead Channel Pacing Threshold Pulse Width: 0.4 ms
Lead Channel Pacing Threshold Pulse Width: 0.4 ms
Lead Channel Sensing Intrinsic Amplitude: 12 mV
Lead Channel Sensing Intrinsic Amplitude: 2.9 mV
Lead Channel Setting Pacing Amplitude: 1.125
Lead Channel Setting Pacing Amplitude: 1.5 V
Lead Channel Setting Pacing Pulse Width: 0.4 ms
Lead Channel Setting Sensing Sensitivity: 4 mV
Pulse Gen Model: 2272
Pulse Gen Serial Number: 3134337

## 2021-11-15 NOTE — Progress Notes (Signed)
Remote pacemaker transmission.   

## 2021-12-03 ENCOUNTER — Ambulatory Visit (INDEPENDENT_AMBULATORY_CARE_PROVIDER_SITE_OTHER): Payer: BC Managed Care – PPO

## 2021-12-03 DIAGNOSIS — Z23 Encounter for immunization: Secondary | ICD-10-CM

## 2021-12-13 ENCOUNTER — Ambulatory Visit (INDEPENDENT_AMBULATORY_CARE_PROVIDER_SITE_OTHER): Payer: BC Managed Care – PPO | Admitting: Family

## 2021-12-13 ENCOUNTER — Encounter: Payer: Self-pay | Admitting: Family

## 2021-12-13 DIAGNOSIS — Z20822 Contact with and (suspected) exposure to covid-19: Secondary | ICD-10-CM | POA: Diagnosis not present

## 2021-12-13 MED ORDER — BENZONATATE 200 MG PO CAPS: 200.0000 mg | ORAL_CAPSULE | Freq: Three times a day (TID) | ORAL | 1 refills | Status: DC | PRN

## 2021-12-13 MED ORDER — MOLNUPIRAVIR EUA 200MG CAPSULE: 4.0000 | ORAL_CAPSULE | Freq: Two times a day (BID) | ORAL | 0 refills | Status: AC

## 2021-12-13 NOTE — Patient Instructions (Signed)

## 2021-12-13 NOTE — Progress Notes (Signed)
Virtual Visit  Note Due to COVID-19 pandemic this visit was conducted virtually. This visit type was conducted due to national recommendations for restrictions regarding the COVID-19 Pandemic (e.g. social distancing, sheltering in place) in an effort to limit this patient's exposure and mitigate transmission in our community. All issues noted in this document were discussed and addressed.  A physical exam was not performed with this format.  I connected with Alejandra Harrison on 12/13/21 at 8:51 AM by telephone and verified that I am speaking with the correct person using two identifiers. Alejandra Harrison is currently located at home and no one is currently with her during visit. The provider, Jannifer Rodney, FNP is located in their office at time of visit.  I discussed the limitations, risks, security and privacy concerns of performing an evaluation and management service by telephone and the availability of in person appointments. I also discussed with the patient that there may be a patient responsible charge related to this service. The patient expressed understanding and agreed to proceed.  Alejandra Harrison, pillard are scheduled for a virtual visit with your provider today.    Just as we do with appointments in the office, we must obtain your consent to participate.  Your consent will be active for this visit and any virtual visit you may have with one of our providers in the next 365 days.    If you have a MyChart account, I can also send a copy of this consent to you electronically.  All virtual visits are billed to your insurance company just like a traditional visit in the office.  As this is a virtual visit, video technology does not allow for your provider to perform a traditional examination.  This may limit your provider's ability to fully assess your condition.  If your provider identifies any concerns that need to be evaluated in person or the need to arrange testing such as labs, EKG, etc, we will make  arrangements to do so.    Although advances in technology are sophisticated, we cannot ensure that it will always work on either your end or our end.  If the connection with a video visit is poor, we may have to switch to a telephone visit.  With either a video or telephone visit, we are not always able to ensure that we have a secure connection.   I need to obtain your verbal consent now.   Are you willing to proceed with your visit today?   Alejandra Harrison has provided verbal consent on 12/13/2021 for a virtual visit (video or telephone).   Jannifer Rodney, [place] 12/13/2021  8:52 AM    History and Present Illness:  PT calls the office today with positive with COVID. She reports her symptoms started yesterday and took a test last night that was positive for COVID.  Cough This is a new problem. The current episode started yesterday. The problem has been unchanged. The problem occurs constantly. The cough is Productive of sputum. Associated symptoms include chills, a fever, headaches, nasal congestion, postnasal drip, rhinorrhea and a sore throat. Pertinent negatives include no ear congestion, ear pain, myalgias, shortness of breath or wheezing. She has tried rest and OTC cough suppressant for the symptoms. The treatment provided mild relief.      Review of Systems  Constitutional:  Positive for chills and fever.  HENT:  Positive for postnasal drip, rhinorrhea and sore throat. Negative for ear pain.   Respiratory:  Positive for cough. Negative for  shortness of breath and wheezing.   Musculoskeletal:  Negative for myalgias.  Neurological:  Positive for headaches.     Observations/Objective: No SOB or distress, nasal congestion   Assessment and Plan: 1. Encounter by telehealth for suspected COVID-19 COVID positive, rest, force fluids, tylenol as needed, Quarantine for at least 5 days and you are fever free, then must wear a mask out in public from day 2-97, report any worsening symptoms such  as increased shortness of breath, swelling, or continued high fevers. Possible adverse effects discussed with antivirals.  Work note given  - molnupiravir EUA (LAGEVRIO) 200 mg CAPS capsule; Take 4 capsules (800 mg total) by mouth 2 (two) times daily for 5 days.  Dispense: 40 capsule; Refill: 0 - benzonatate (TESSALON) 200 MG capsule; Take 1 capsule (200 mg total) by mouth 3 (three) times daily as needed.  Dispense: 30 capsule; Refill: 1     I discussed the assessment and treatment plan with the patient. The patient was provided an opportunity to ask questions and all were answered. The patient agreed with the plan and demonstrated an understanding of the instructions.   The patient was advised to call back or seek an in-person evaluation if the symptoms worsen or if the condition fails to improve as anticipated.  The above assessment and management plan was discussed with the patient. The patient verbalized understanding of and has agreed to the management plan. Patient is aware to call the clinic if symptoms persist or worsen. Patient is aware when to return to the clinic for a follow-up visit. Patient educated on when it is appropriate to go to the emergency department.   Time call ended:  9:03 AM   I provided 12 minutes of  non face-to-face time during this encounter.    Evelina Dun, FNP

## 2022-01-21 ENCOUNTER — Ambulatory Visit (INDEPENDENT_AMBULATORY_CARE_PROVIDER_SITE_OTHER): Payer: BC Managed Care – PPO

## 2022-01-21 DIAGNOSIS — I442 Atrioventricular block, complete: Secondary | ICD-10-CM | POA: Diagnosis not present

## 2022-01-21 LAB — CUP PACEART REMOTE DEVICE CHECK
Battery Remaining Longevity: 47 mo
Battery Remaining Percentage: 37 %
Battery Voltage: 2.93 V
Brady Statistic AP VP Percent: 13 %
Brady Statistic AP VS Percent: 1 %
Brady Statistic AS VP Percent: 87 %
Brady Statistic AS VS Percent: 1 %
Brady Statistic RA Percent Paced: 13 %
Brady Statistic RV Percent Paced: 99 %
Date Time Interrogation Session: 20231128020015
Implantable Lead Connection Status: 753985
Implantable Lead Connection Status: 753985
Implantable Lead Implant Date: 20170323
Implantable Lead Implant Date: 20170323
Implantable Lead Location: 753859
Implantable Lead Location: 753860
Implantable Pulse Generator Implant Date: 20170323
Lead Channel Impedance Value: 440 Ohm
Lead Channel Impedance Value: 540 Ohm
Lead Channel Pacing Threshold Amplitude: 0.5 V
Lead Channel Pacing Threshold Amplitude: 0.875 V
Lead Channel Pacing Threshold Pulse Width: 0.4 ms
Lead Channel Pacing Threshold Pulse Width: 0.4 ms
Lead Channel Sensing Intrinsic Amplitude: 12 mV
Lead Channel Sensing Intrinsic Amplitude: 3.4 mV
Lead Channel Setting Pacing Amplitude: 1.125
Lead Channel Setting Pacing Amplitude: 1.5 V
Lead Channel Setting Pacing Pulse Width: 0.4 ms
Lead Channel Setting Sensing Sensitivity: 4 mV
Pulse Gen Model: 2272
Pulse Gen Serial Number: 3134337

## 2022-02-20 NOTE — Progress Notes (Signed)
Remote pacemaker transmission.   

## 2022-04-07 ENCOUNTER — Telehealth (HOSPITAL_COMMUNITY): Payer: BC Managed Care – PPO | Admitting: Psychiatry

## 2022-04-07 ENCOUNTER — Encounter (HOSPITAL_COMMUNITY): Payer: Self-pay | Admitting: Psychiatry

## 2022-04-07 DIAGNOSIS — F5105 Insomnia due to other mental disorder: Secondary | ICD-10-CM | POA: Diagnosis not present

## 2022-04-07 DIAGNOSIS — F23 Brief psychotic disorder: Secondary | ICD-10-CM

## 2022-04-07 MED ORDER — ARIPIPRAZOLE 2 MG PO TABS: 1.0000 mg | ORAL_TABLET | Freq: Every day | ORAL | 5 refills | Status: DC

## 2022-04-07 NOTE — Progress Notes (Signed)
Virtual Visit via Telephone Note  I connected with Alejandra Harrison on 04/07/22 at  9:00 AM EST by telephone and verified that I am speaking with the correct person using two identifiers.  Location: Patient: home Provider: office   I discussed the limitations, risks, security and privacy concerns of performing an evaluation and management service by telephone and the availability of in person appointments. I also discussed with the patient that there may be a patient responsible charge related to this service. The patient expressed understanding and agreed to proceed.       I discussed the assessment and treatment plan with the patient. The patient was provided an opportunity to ask questions and all were answered. The patient agreed with the plan and demonstrated an understanding of the instructions.   The patient was advised to call back or seek an in-person evaluation if the symptoms worsen or if the condition fails to improve as anticipated.  I provided 12 minutes of non-face-to-face time during this encounter.   Levonne Spiller, MD  [facility] MD/PA/NP OP Progress Note  04/07/2022 9:16 AM Alejandra Harrison  MRN:  [id number]  Chief Complaint:  Chief Complaint  Patient presents with   Hallucinations   Follow-up   HPI: This patient is a 62 year old married white female who lives with her husband and 2 sons in [place]. 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   The patient states that in 2012 she started seeing a figure that she thought may have been God. She also got fixated on a man in her Sunday school and thought that her husband would die and she would have to marry this man. She was having lots of religious visions. She claims that she had not been on any medication or using drugs or alcohol at that time. She had not had a head injury or any additional stress. She may of been going through menopause. She denied visual hallucinations but claims she felt "high and on top  of the world." She's never had a history of depression however.   At any rate she was seen here by Dr. Toy Cookey and started on Abilify. She was on a higher dose such as 5 mg in the past and it caused akathisia. Currently she takes 1 mg per day and it seems to be working fairly well. She's no longer having visions or unusual thoughts  The patient returns for follow-up after 6 months.  She states that she is doing well.  She still enjoys her job at [employer]  [name] feeling well and her heart is in good shape with the pacemaker.  She denies any symptoms of depression anxiety thoughts about religion delusions or hallucinations.  [name] sleeping well.  She denies twitching jerking akathisia or other side effects from Abilify     Visit Diagnosis:    ICD-10-CM   1. 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 Insomnia due to mental disorder  F51.05 ARIPiprazole (ABILIFY) 2 MG tablet      Past Psychiatric History: none  Past Medical History:  Past Medical History:  Diagnosis Date   Anxiety    AV block, complete ([place])    History of seasonal allergies    Psychosis ([place])     Past Surgical History:  Procedure Laterality Date   COLONOSCOPY N/A 09/28/2015   Procedure: COLONOSCOPY;  Surgeon: Danie Binder, MD;  [facility];  Service: Endoscopy;  Laterality:  N/A;  1030   EP IMPLANTABLE DEVICE N/A 05/17/2015   Procedure: Pacemaker Implant;  Surgeon: Will Meredith Leeds, MD;  Location: [facility];  Service: Cardiovascular;  Laterality: N/A;   INSERT / REPLACE / REMOVE PACEMAKER  05/17/2015   TUBAL LIGATION      Family Psychiatric History: See below  Family History:  Family History  Problem Relation Age of Onset   Dementia Mother    Diabetes Father    Breast cancer Paternal Aunt    ADD / ADHD Neg Hx    Alcohol abuse Neg Hx    Drug abuse Neg Hx    Anxiety disorder Neg Hx    Bipolar disorder Neg Hx    Depression Neg Hx    OCD Neg Hx    Paranoid behavior Neg Hx     Schizophrenia Neg Hx    Seizures Neg Hx    Sexual abuse Neg Hx    Physical abuse Neg Hx    Colon cancer Neg Hx     Social History:  Social History   Socioeconomic History   Marital status: Married    Spouse name: Not on file   Number of children: Not on file   Years of education: Not on file   Highest education level: Not on file  Occupational History   Not on file  Tobacco Use   Smoking status: Never   Smokeless tobacco: Never   Tobacco comments:    Never smoked  Vaping Use   Vaping Use: Never used  Substance and Sexual Activity   Alcohol use: No    Alcohol/week: 0.0 standard drinks of alcohol   Drug use: No   Sexual activity: Not Currently    Birth control/protection: Post-menopausal  Other Topics Concern   Not on file  Social History Narrative   Not on file   Social Determinants of Health   Financial Resource Strain: Not on file  Food Insecurity: Not on file  Transportation Needs: Not on file  Physical Activity: Not on file  Stress: Not on file  Social Connections: Not on file    Allergies:  Allergies  Allergen Reactions   Oxycodone-Acetaminophen Hives and Other (See Comments)    Hands peeled   Penicillins Hives and Other (See Comments)    Hands peeled    Metabolic Disorder Labs: No results found for: "HGBA1C", "MPG" No results found for: "PROLACTIN" Lab Results  Component Value Date   CHOL 176 08/30/2021   TRIG 132 08/30/2021   HDL 46 08/30/2021   CHOLHDL 3.8 08/30/2021   LDLCALC 106 (H) 08/30/2021   LDLCALC 94 08/28/2020   Lab Results  Component Value Date   TSH 2.420 08/30/2021   TSH 2.470 08/28/2020    Therapeutic Level Labs: No results found for: "LITHIUM" No results found for: "VALPROATE" No results found for: "CBMZ"  Current Medications: Current Outpatient Medications  Medication Sig Dispense Refill   ARIPiprazole (ABILIFY) 2 MG tablet Take 0.5 tablets (1 mg total) by mouth daily. 30 tablet 5   benzonatate (TESSALON) 200 MG  capsule Take 1 capsule (200 mg total) by mouth 3 (three) times daily as needed. 30 capsule 1   metoprolol tartrate (LOPRESSOR) 50 MG tablet Take 1 tablet (50 mg total) by mouth 2 (two) times daily. 180 tablet 3   No current facility-administered medications for this visit.     Musculoskeletal: Strength & Muscle Tone: na Gait & Station: na Patient leans: N/A  Psychiatric Specialty Exam: Review of Systems  All other systems reviewed and are negative.   There were no vitals taken for this visit.There is no height or weight on file to calculate BMI.  General Appearance: NA  Eye Contact:  NA  Speech:  Clear and Coherent  Volume:  Normal  Mood:  Euthymic  Affect:  NA  Thought Process:  Goal Directed  Orientation:  Full (Time, Place, and Person)  Thought Content: WDL   Suicidal Thoughts:  No  Homicidal Thoughts:  No  Memory:  Immediate;   Good Recent;   Good Remote;   Good  Judgement:  Good  Insight:  Fair  Psychomotor Activity:  Normal  Concentration:  Concentration: Good and Attention Span: Good  Recall:  Good  Fund of Knowledge: Good  Language: Good  Akathisia:  No  Handed:  Right  AIMS (if indicated): not done  Assets:  Communication Skills Desire for Improvement Physical Health Resilience Social Support Talents/Skills  ADL's:  Intact  Cognition: WNL  Sleep:  Good   Screenings: GAD-7    Flowsheet Row Office Visit from 05/15/2021 in [place] Office Visit from 08/28/2020 in [place] Office Visit from 08/10/2020 in [place]  Total GAD-7 Score 0 0 0      PHQ2-9    [place] Office Visit from 08/30/2021 in [place] Office Visit from 05/15/2021 in [place] Office Visit from 08/28/2020 in [place] Office Visit from 08/10/2020 in [place] Office Visit from 06/01/2020 in [facility]  PHQ-2 Total Score 0 0 0 0 0  PHQ-9 Total Score -- 0 0 0 --        Assessment and Plan: This patient is a 62 year old female with a past episode of psychosis.  [name] maintained well on Abilify 1 mg daily without relapse or side effect.  She will continue this dosage and return to see me in 6 months  Collaboration of Care: Collaboration of Care: Primary Care Provider AEB notes are shared with PCP through the epic system  Patient/Guardian was advised Release of Information must be obtained prior to any record release in order to collaborate their care with an outside provider. Patient/Guardian was advised if they have not already done so to contact the registration department to sign all necessary forms in order for [place] to release information regarding their care.   Consent: Patient/Guardian gives verbal consent for treatment and assignment of benefits for services provided during this visit. Patient/Guardian expressed understanding and agreed to proceed.    Levonne Spiller, MD 04/07/2022, 9:16 AM

## 2022-04-22 ENCOUNTER — Ambulatory Visit: Payer: BC Managed Care – PPO

## 2022-04-22 DIAGNOSIS — I442 Atrioventricular block, complete: Secondary | ICD-10-CM

## 2022-04-23 LAB — CUP PACEART REMOTE DEVICE CHECK
Battery Remaining Longevity: 44 mo
Battery Remaining Percentage: 34 %
Battery Voltage: 2.93 V
Brady Statistic AP VP Percent: 14 %
Brady Statistic AP VS Percent: 1 %
Brady Statistic AS VP Percent: 86 %
Brady Statistic AS VS Percent: 1 %
Brady Statistic RA Percent Paced: 13 %
Brady Statistic RV Percent Paced: 99 %
Date Time Interrogation Session: 20240227033120
Implantable Lead Connection Status: 753985
Implantable Lead Connection Status: 753985
Implantable Lead Implant Date: 20170323
Implantable Lead Implant Date: 20170323
Implantable Lead Location: 753859
Implantable Lead Location: 753860
Implantable Pulse Generator Implant Date: 20170323
Lead Channel Impedance Value: 390 Ohm
Lead Channel Impedance Value: 590 Ohm
Lead Channel Pacing Threshold Amplitude: 0.5 V
Lead Channel Pacing Threshold Amplitude: 0.875 V
Lead Channel Pacing Threshold Pulse Width: 0.4 ms
Lead Channel Pacing Threshold Pulse Width: 0.4 ms
Lead Channel Sensing Intrinsic Amplitude: 12 mV
Lead Channel Sensing Intrinsic Amplitude: 3.6 mV
Lead Channel Setting Pacing Amplitude: 1.125
Lead Channel Setting Pacing Amplitude: 1.5 V
Lead Channel Setting Pacing Pulse Width: 0.4 ms
Lead Channel Setting Sensing Sensitivity: 4 mV
Pulse Gen Model: 2272
Pulse Gen Serial Number: 3134337

## 2022-04-24 ENCOUNTER — Encounter: Payer: Self-pay | Admitting: Radiology

## 2022-05-28 NOTE — Progress Notes (Signed)
Remote pacemaker transmission.   

## 2022-07-22 ENCOUNTER — Ambulatory Visit (INDEPENDENT_AMBULATORY_CARE_PROVIDER_SITE_OTHER): Payer: BC Managed Care – PPO

## 2022-07-22 DIAGNOSIS — I442 Atrioventricular block, complete: Secondary | ICD-10-CM

## 2022-07-23 LAB — CUP PACEART REMOTE DEVICE CHECK
Battery Remaining Longevity: 41 mo
Battery Remaining Percentage: 32 %
Battery Voltage: 2.92 V
Brady Statistic AP VP Percent: 14 %
Brady Statistic AP VS Percent: 1 %
Brady Statistic AS VP Percent: 86 %
Brady Statistic AS VS Percent: 1 %
Brady Statistic RA Percent Paced: 14 %
Brady Statistic RV Percent Paced: 99 %
Date Time Interrogation Session: 20240528020015
Implantable Lead Connection Status: 753985
Implantable Lead Connection Status: 753985
Implantable Lead Implant Date: 20170323
Implantable Lead Implant Date: 20170323
Implantable Lead Location: 753859
Implantable Lead Location: 753860
Implantable Pulse Generator Implant Date: 20170323
Lead Channel Impedance Value: 410 Ohm
Lead Channel Impedance Value: 460 Ohm
Lead Channel Pacing Threshold Amplitude: 0.5 V
Lead Channel Pacing Threshold Amplitude: 0.75 V
Lead Channel Pacing Threshold Pulse Width: 0.4 ms
Lead Channel Pacing Threshold Pulse Width: 0.4 ms
Lead Channel Sensing Intrinsic Amplitude: 12 mV
Lead Channel Sensing Intrinsic Amplitude: 3.6 mV
Lead Channel Setting Pacing Amplitude: 1 V
Lead Channel Setting Pacing Amplitude: 1.5 V
Lead Channel Setting Pacing Pulse Width: 0.4 ms
Lead Channel Setting Sensing Sensitivity: 4 mV
Pulse Gen Model: 2272
Pulse Gen Serial Number: 3134337

## 2022-08-06 ENCOUNTER — Ambulatory Visit: Payer: BC Managed Care – PPO | Admitting: Family Medicine

## 2022-08-06 ENCOUNTER — Encounter: Payer: Self-pay | Admitting: Family Medicine

## 2022-08-06 DIAGNOSIS — J069 Acute upper respiratory infection, unspecified: Secondary | ICD-10-CM | POA: Diagnosis not present

## 2022-08-06 MED ORDER — BENZONATATE 200 MG PO CAPS: 200.0000 mg | ORAL_CAPSULE | Freq: Three times a day (TID) | ORAL | 1 refills | Status: DC | PRN

## 2022-08-06 NOTE — Patient Instructions (Signed)

## 2022-08-06 NOTE — Progress Notes (Signed)
Acute Office Visit  Subjective:     Patient ID: Alejandra Harrison, female    DOB: 1960-10-26, 62 y.o.   MRN: [id number]  Chief Complaint  Patient presents with   Follow-up    Congestion/cough started Monday  Temp last 102 and this morning temp 99 Pt did an at-home Covid test: Neg    URI  This is a new problem. Episode onset: 2 days ago. The maximum temperature recorded prior to her arrival was 102 - 102.9 F. The fever has been present for 1 to 2 days. Associated symptoms include congestion and coughing. Pertinent negatives include no abdominal pain, chest pain, diarrhea, dysuria, ear pain, headaches, joint pain, joint swelling, nausea, neck pain, rash, rhinorrhea, sinus pain, sneezing, sore throat, vomiting or wheezing. Treatments tried: mucinex. The treatment provided mild relief.   She had a negative home Covid test yesterday.   Review of Systems  HENT:  Positive for congestion. Negative for ear pain, rhinorrhea, sinus pain, sneezing and sore throat.   Respiratory:  Positive for cough. Negative for wheezing.   Cardiovascular:  Negative for chest pain.  Gastrointestinal:  Negative for abdominal pain, diarrhea, nausea and vomiting.  Genitourinary:  Negative for dysuria.  Musculoskeletal:  Negative for joint pain and neck pain.  Skin:  Negative for rash.  Neurological:  Negative for headaches.        Objective:    BP 123/82   Pulse 81   Temp 98.3 F (36.8 C) (Oral)   Ht 5\' 4"  (1.626 m)   Wt 250 lb (113.4 kg)   SpO2 95%   BMI 42.91 kg/m    Physical Exam Vitals and nursing note reviewed.  Constitutional:      General: [name] not in acute distress.    Appearance: [name] ill-appearing. [name] not toxic-appearing or diaphoretic.  HENT:     Right Ear: Tympanic membrane, ear canal and external ear normal.     Left Ear: Tympanic membrane, ear canal and external ear normal.     Nose: Congestion present.     Mouth/Throat:     Mouth: Mucous membranes are moist.     Pharynx:  Oropharynx is clear. No oropharyngeal exudate or posterior oropharyngeal erythema.  Eyes:     Extraocular Movements: Extraocular movements intact.     Conjunctiva/sclera: Conjunctivae normal.  Cardiovascular:     Rate and Rhythm: Normal rate and regular rhythm.     Heart sounds: Normal heart sounds. No murmur heard. Pulmonary:     Effort: Pulmonary effort is normal. No respiratory distress.     Breath sounds: Normal breath sounds. No wheezing, rhonchi or rales.  Chest:     Chest wall: No tenderness.  Abdominal:     General: Bowel sounds are normal. There is no distension.     Palpations: Abdomen is soft.     Tenderness: There is no abdominal tenderness. There is no guarding or rebound.  Musculoskeletal:     Cervical back: Neck supple. No rigidity.     Right lower leg: No edema.     Left lower leg: No edema.  Skin:    General: Skin is warm and dry.  Neurological:     General: No focal deficit present.     Mental Status: [name] alert and oriented to person, place, and time.  Psychiatric:        Mood and Affect: Mood normal.        Behavior: Behavior normal.     No results  found for any visits on 08/06/22.      Assessment & Plan:   Alejandra Harrison was seen today for follow-up.  Diagnoses and all orders for this visit:  Viral URI with cough Covid, flu, RSV test pending. Quarantine until results. Discussed symptomatic care and return precautions.  -     benzonatate (TESSALON) 200 MG capsule; Take 1 capsule (200 mg total) by mouth 3 (three) times daily as needed. -     COVID-19, Flu A+B and RSV   Return if symptoms worsen or fail to improve.  The patient indicates understanding of these issues and agrees with the plan.  Gabriel Earing, FNP

## 2022-08-07 LAB — COVID-19, FLU A+B AND RSV
Influenza A, NAA: NOT DETECTED
Influenza B, NAA: NOT DETECTED
RSV, NAA: NOT DETECTED
SARS-CoV-2, NAA: NOT DETECTED

## 2022-08-11 ENCOUNTER — Encounter: Payer: BC Managed Care – PPO | Admitting: Physician Assistant

## 2022-08-11 NOTE — Progress Notes (Unsigned)
Cardiology Office Note Date:  08/12/2022  Patient ID:  [name], [name] 1960/03/06, MRN [id number] PCP:  Junie Spencer, FNP   Electrophysiologist: Dr. Elberta Fortis    Chief Complaint: annual visit  History of Present Illness: Alejandra Harrison is a 62 y.o. female with history of CHB w/PPM, NSVT  She saw Dr. Elberta Fortis 08/05/21, doing well, on metoprolol for her NSVT (noted via device on prior visit), none further noted, no changes were made.  TODAY [name] doing quite well. 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 No CP, SOB, DOE, rarely a fleeting sense of quick beats No near syncope or syncope. Last week with a URI/cough/cold she had a little dizzy spell associated with coughing, none otherwise. Her PMD does her labs   Device information Abbott dual chamber PPM implanted 05/17/2015   Past Medical History:  Diagnosis Date   Anxiety    AV block, complete ([facility])    History of seasonal allergies    Psychosis ([facility])     Past Surgical History:  Procedure Laterality Date   COLONOSCOPY N/A 09/28/2015   Procedure: COLONOSCOPY;  Surgeon: West Bali, MD;  [facility];  Service: Endoscopy;  Laterality: N/A;  1030   EP IMPLANTABLE DEVICE N/A 05/17/2015   Procedure: Pacemaker Implant;  Surgeon: Will Jorja Loa, MD;  [facility];  Service: Cardiovascular;  Laterality: N/A;   INSERT / REPLACE / REMOVE PACEMAKER  05/17/2015   TUBAL LIGATION      Current Outpatient Medications  Medication Sig Dispense Refill   ARIPiprazole (ABILIFY) 2 MG tablet Take 0.5 tablets (1 mg total) by mouth daily. 30 tablet 5   benzonatate (TESSALON) 200 MG capsule Take 1 capsule (200 mg total) by mouth 3 (three) times daily as needed. 30 capsule 1   metoprolol tartrate (LOPRESSOR) 50 MG tablet Take 1 tablet (50 mg total) by mouth 2 (two) times daily. 180 tablet 3   No current facility-administered medications for this  visit.    Allergies:   Oxycodone-acetaminophen and Penicillins   Social History:  The patient  reports that she has never smoked. She has never used smokeless tobacco. She reports that she does not drink alcohol and does not use drugs.   Family History:  The patient's family history includes Breast cancer in her paternal aunt; Dementia in her mother; Diabetes in her father.  ROS:  Please see the history of present illness.    All other systems are reviewed and otherwise negative.   PHYSICAL EXAM:  VS:  BP 134/84   Pulse 65   Ht 5\' 4"  (1.626 m)   Wt 247 lb 3.2 oz (112.1 kg)   SpO2 95%   BMI 42.43 kg/m  BMI: Body mass index is 42.43 kg/m. Well nourished, well developed, in no acute distress HEENT: normocephalic, atraumatic Neck: no JVD, carotid bruits or masses Cardiac:  RRR; no significant murmurs, no rubs, or gallops Lungs:   CTA b/l, no wheezing, rhonchi or rales Abd: soft, nontender MS: no deformity or atrophy Ext: no edema Skin: warm and dry, no rash Neuro:  No gross deficits appreciated Psych: euthymic mood, full affect  PPM site is stable, no tethering or discomfort   EKG:  Done today and reviewed by myself shows  AS/VP 66bpm  Device interrogation done today and reviewed by myself:  Battery and lead measurements are good She has had  very brief (longest 6 seconds) Atach, perhaps one Aflutter w/VP  [name] device dependent   07/16/2016: TTE Study Conclusions  - Left ventricle: The cavity size was normal. Wall thickness was    increased increased in a pattern of mild to moderate LVH.    Systolic function was normal. The estimated ejection fraction was    in the range of 60% to 65%. Doppler parameters are consistent    with abnormal left ventricular relaxation (grade 1 diastolic    dysfunction).  - Aortic valve: Mildly calcified annulus. Trileaflet; normal    thickness leaflets. Valve area (VTI): 2.55 cm^2. Valve area    (Vmax): 2.18 cm^2. Valve area (Vmean):  2.17 cm^2.  - Technically adequate study.   Recent Labs: 08/30/2021: ALT 36; BUN 8; Creatinine, Ser 0.80; Hemoglobin 14.9; Platelets 309; Potassium 5.0; Sodium 142; TSH 2.420  08/30/2021: Chol/HDL Ratio 3.8; Cholesterol, Total 176; HDL 46; LDL Chol Calc (NIH) 106; Triglycerides 132   CrCl cannot be calculated (Patient's most recent lab result is older than the maximum 21 days allowed.).   Wt Readings from Last 3 Encounters:  08/12/22 247 lb 3.2 oz (112.1 kg)  08/06/22 250 lb (113.4 kg)  08/30/21 250 lb (113.4 kg)     Other studies reviewed: Additional studies/records reviewed today include: summarized above  ASSESSMENT AND PLAN:  PPM Intct function No programming changes made  NSVT On metoprolol None noted  Disposition: F/u with remotes as usual, in clinic again with EP in a year, sooner if needed  Current medicines are reviewed at length with the patient today.  The patient did not have any concerns regarding medicines.  Norma Fredrickson, PA-C 08/12/2022 1:05 PM     [facility] [address] [place] [address] [phone] (office)  [phone] (fax)

## 2022-08-12 ENCOUNTER — Encounter: Payer: Self-pay | Admitting: Physician Assistant

## 2022-08-12 ENCOUNTER — Ambulatory Visit: Payer: BC Managed Care – PPO | Attending: Physician Assistant | Admitting: Physician Assistant

## 2022-08-12 VITALS — BP 134/84 | HR 65 | Ht 64.0 in | Wt 247.2 lb

## 2022-08-12 DIAGNOSIS — Z950 Presence of cardiac pacemaker: Secondary | ICD-10-CM | POA: Diagnosis not present

## 2022-08-12 DIAGNOSIS — I4729 Other ventricular tachycardia: Secondary | ICD-10-CM | POA: Diagnosis not present

## 2022-08-12 DIAGNOSIS — I442 Atrioventricular block, complete: Secondary | ICD-10-CM | POA: Diagnosis not present

## 2022-08-12 LAB — CUP PACEART INCLINIC DEVICE CHECK
Battery Remaining Longevity: 40 mo
Battery Voltage: 2.92 V
Brady Statistic RA Percent Paced: 14 %
Brady Statistic RV Percent Paced: 99.98 %
Date Time Interrogation Session: 20240618132200
Implantable Lead Connection Status: 753985
Implantable Lead Connection Status: 753985
Implantable Lead Implant Date: 20170323
Implantable Lead Implant Date: 20170323
Implantable Lead Location: 753859
Implantable Lead Location: 753860
Implantable Pulse Generator Implant Date: 20170323
Lead Channel Impedance Value: 412.5 Ohm
Lead Channel Impedance Value: 562.5 Ohm
Lead Channel Pacing Threshold Amplitude: 0.625 V
Lead Channel Pacing Threshold Amplitude: 0.75 V
Lead Channel Pacing Threshold Pulse Width: 0.4 ms
Lead Channel Pacing Threshold Pulse Width: 0.4 ms
Lead Channel Sensing Intrinsic Amplitude: 12 mV
Lead Channel Sensing Intrinsic Amplitude: 2.6 mV
Lead Channel Setting Pacing Amplitude: 1 V
Lead Channel Setting Pacing Amplitude: 1.625
Lead Channel Setting Pacing Pulse Width: 0.4 ms
Lead Channel Setting Sensing Sensitivity: 4 mV
Pulse Gen Model: 2272
Pulse Gen Serial Number: 3134337

## 2022-08-12 MED ORDER — METOPROLOL TARTRATE 50 MG PO TABS: 50.0000 mg | ORAL_TABLET | Freq: Two times a day (BID) | ORAL | 3 refills | Status: DC

## 2022-08-12 NOTE — Patient Instructions (Signed)
Medication Instructions:   Your physician recommends that you continue on your current medications as directed. Please refer to the Current Medication list given to you today.   *If you need a refill on your cardiac medications before your next appointment, please call your pharmacy*   Lab Work: NONE ORDERED  TODAY    If you have labs (blood work) drawn today and your tests are completely normal, you will receive your results only by: MyChart Message (if you have MyChart) OR A paper copy in the mail If you have any lab test that is abnormal or we need to change your treatment, we will call you to review the results.   Testing/Procedures: NONE ORDERED  TODAY     Follow-Up: At [facility], you and your health needs are our priority.  As part of our continuing mission to provide you with exceptional heart care, we have created designated Provider Care Teams.  These Care Teams include your primary Cardiologist (physician) and Advanced Practice Providers (APPs -  Physician Assistants and Nurse Practitioners) who all work together to provide you with the care you need, when you need it.  We recommend signing up for the patient portal called "MyChart".  Sign up information is provided on this After Visit Summary.  MyChart is used to connect with patients for Virtual Visits (Telemedicine).  Patients are able to view lab/test results, encounter notes, upcoming appointments, etc.  Non-urgent messages can be sent to your provider as well.   To learn more about what you can do with MyChart, go to [email or web address].    Your next appointment:   1 year(s)  Provider:   You may see Will Martin Camnitz, MD or one of the following Advanced Practice Providers on your designated Care Team:   Renee Ursuy, PA-C  Other Instructions  

## 2022-08-15 NOTE — Progress Notes (Signed)
Remote pacemaker transmission.   

## 2022-09-01 ENCOUNTER — Other Ambulatory Visit: Payer: Self-pay | Admitting: Family

## 2022-09-01 DIAGNOSIS — Z1231 Encounter for screening mammogram for malignant neoplasm of breast: Secondary | ICD-10-CM

## 2022-09-02 ENCOUNTER — Encounter: Payer: Self-pay | Admitting: Family

## 2022-09-02 ENCOUNTER — Ambulatory Visit (INDEPENDENT_AMBULATORY_CARE_PROVIDER_SITE_OTHER): Payer: BC Managed Care – PPO | Admitting: Family

## 2022-09-02 VITALS — BP 104/76 | HR 77 | Temp 97.6°F | Ht 64.0 in | Wt 249.6 lb

## 2022-09-02 DIAGNOSIS — F320 Major depressive disorder, single episode, mild: Secondary | ICD-10-CM

## 2022-09-02 DIAGNOSIS — K5900 Constipation, unspecified: Secondary | ICD-10-CM

## 2022-09-02 DIAGNOSIS — F411 Generalized anxiety disorder: Secondary | ICD-10-CM

## 2022-09-02 DIAGNOSIS — Z0001 Encounter for general adult medical examination with abnormal findings: Secondary | ICD-10-CM

## 2022-09-02 DIAGNOSIS — I442 Atrioventricular block, complete: Secondary | ICD-10-CM

## 2022-09-02 DIAGNOSIS — K219 Gastro-esophageal reflux disease without esophagitis: Secondary | ICD-10-CM

## 2022-09-02 DIAGNOSIS — F5105 Insomnia due to other mental disorder: Secondary | ICD-10-CM

## 2022-09-02 DIAGNOSIS — Z0000 Encounter for general adult medical examination without abnormal findings: Secondary | ICD-10-CM

## 2022-09-02 LAB — CMP14+EGFR

## 2022-09-02 LAB — LIPID PANEL

## 2022-09-02 LAB — CBC WITH DIFFERENTIAL/PLATELET
Basophils Absolute: 0.1 10*3/uL (ref 0.0–0.2)
Basos: 1 %
EOS (ABSOLUTE): 0.3 10*3/uL (ref 0.0–0.4)
Eos: 4 %
Hematocrit: 43.4 % (ref 34.0–46.6)
Lymphocytes Absolute: 1.8 10*3/uL (ref 0.7–3.1)
MCH: 30.1 pg (ref 26.6–33.0)
MCHC: 33.6 g/dL (ref 31.5–35.7)
Monocytes Absolute: 0.5 10*3/uL (ref 0.1–0.9)
Monocytes: 7 %
Neutrophils: 61 %
Platelets: 283 10*3/uL (ref 150–450)
WBC: 6.9 10*3/uL (ref 3.4–10.8)

## 2022-09-02 LAB — TSH

## 2022-09-02 NOTE — Progress Notes (Signed)
Subjective:    Patient ID: Alejandra Harrison, female    DOB: 1961/01/19, 62 y.o.   MRN: [id number]  Chief Complaint  Patient presents with   Annual Exam   Pt presents to the office today for CPE without pap.  Her last pap was 08/30/21.   [name] followed by Cardiologists annually for heart block and has a pacemaker. [name] followed by [facility] every 6 months for Depression and GAD.  These are stable.  Gastroesophageal Reflux She complains of belching and heartburn. This is a chronic problem. The problem occurs occasionally. The symptoms are aggravated by certain foods. Risk factors include obesity. She has tried a PPI for the symptoms. The treatment provided moderate relief.  Insomnia Primary symptoms: difficulty falling asleep, frequent awakening.   The current episode started more than one year. The problem occurs intermittently. Past treatments include medication. The treatment provided moderate relief. PMH includes: depression.   Anxiety Presents for follow-up visit. Symptoms include excessive worry, insomnia, nervous/anxious behavior and restlessness. Symptoms occur occasionally. The severity of symptoms is mild.    Depression        This is a chronic problem.  The current episode started more than 1 year ago.   The problem occurs intermittently.  Associated symptoms include helplessness, insomnia, restlessness and sad.  Associated symptoms include no hopelessness.  Past medical history includes anxiety.   Constipation This is a chronic problem. The current episode started more than 1 year ago. The problem has been resolved since onset. Her stool frequency is 1 time per day. She has tried diet changes for the symptoms. The treatment provided moderate relief.      Review of Systems  Gastrointestinal:  Positive for constipation and heartburn.  Psychiatric/Behavioral:  Positive for depression. The patient is nervous/anxious and has insomnia.   All other systems reviewed and are  negative.  Family History  Problem Relation Age of Onset   Dementia Mother    Diabetes Father    Breast cancer Paternal Aunt    ADD / ADHD Neg Hx    Alcohol abuse Neg Hx    Drug abuse Neg Hx    Anxiety disorder Neg Hx    Bipolar disorder Neg Hx    Depression Neg Hx    OCD Neg Hx    Paranoid behavior Neg Hx    Schizophrenia Neg Hx    Seizures Neg Hx    Sexual abuse Neg Hx    Physical abuse Neg Hx    Colon cancer Neg Hx    Social History   Socioeconomic History   Marital status: Married    Spouse name: Not on file   Number of children: Not on file   Years of education: Not on file   Highest education level: Not on file  Occupational History   Not on file  Tobacco Use   Smoking status: Never   Smokeless tobacco: Never   Tobacco comments:    Never smoked  Vaping Use   Vaping Use: Never used  Substance and Sexual Activity   Alcohol use: No    Alcohol/week: 0.0 standard drinks of alcohol   Drug use: No   Sexual activity: Not Currently    Birth control/protection: Post-menopausal  Other Topics Concern   Not on file  Social History Narrative   Not on file   Social Determinants of Health   Financial Resource Strain: Not on file  Food Insecurity: Not on file  Transportation Needs: Not on file  Physical Activity: Not on file  Stress: Not on file  Social Connections: Not on file        Objective:   Physical Exam Vitals reviewed.  Constitutional:      General: [name] not in acute distress.    Appearance: [name] well-developed. [name] obese.  HENT:     Head: Normocephalic and atraumatic.     Right Ear: Tympanic membrane normal.     Left Ear: Tympanic membrane normal.  Eyes:     Pupils: Pupils are equal, round, and reactive to light.  Neck:     Thyroid: No thyromegaly.  Cardiovascular:     Rate and Rhythm: Normal rate and regular rhythm.     Heart sounds: Normal heart sounds. No murmur heard. Pulmonary:     Effort: Pulmonary effort is normal. No  respiratory distress.     Breath sounds: Normal breath sounds. No wheezing.  Abdominal:     General: Bowel sounds are normal. There is no distension.     Palpations: Abdomen is soft.     Tenderness: There is no abdominal tenderness.  Musculoskeletal:        General: No tenderness. Normal range of motion.     Cervical back: Normal range of motion and neck supple.  Skin:    General: Skin is warm and dry.  Neurological:     Mental Status: [name] alert and oriented to person, place, and time.     Cranial Nerves: No cranial nerve deficit.     Deep Tendon Reflexes: Reflexes are normal and symmetric.  Psychiatric:        Behavior: Behavior normal.        Thought Content: Thought content normal.        Judgment: Judgment normal.       BP 104/76   Pulse 77   Temp 97.6 F (36.4 C) (Temporal)   Ht 5\' 4"  (1.626 m)   Wt 249 lb 9.6 oz (113.2 kg)   SpO2 94%   BMI 42.84 kg/m      Assessment & Plan:   DIANE CUSICK comes in today with chief complaint of Annual Exam   Diagnosis and orders addressed:  1. Annual physical exam - CBC with Differential/Platelet - CMP14+EGFR - Lipid panel - TSH  2. CHB (complete heart block) (HCC) - CBC with Differential/Platelet - CMP14+EGFR  3. Constipation, unspecified constipation type - CBC with Differential/Platelet - CMP14+EGFR  4. Depression, major, single episode, mild (HCC) - CBC with Differential/Platelet - CMP14+EGFR  5. GAD (generalized anxiety disorder) - CBC with Differential/Platelet - CMP14+EGFR  6. Gastroesophageal reflux disease, unspecified whether esophagitis present - CBC with Differential/Platelet - CMP14+EGFR  7. Insomnia due to mental disorder - CBC with Differential/Platelet - CMP14+EGFR  8. Morbid obesity (HCC) - CBC with Differential/Platelet - CMP14+EGFR   Labs pending Health Maintenance reviewed Diet and exercise encouraged  Follow up plan: 1 year    Jannifer Rodney, FNP

## 2022-09-02 NOTE — Patient Instructions (Signed)

## 2022-09-03 LAB — CBC WITH DIFFERENTIAL/PLATELET
Hemoglobin: 14.6 g/dL (ref 11.1–15.9)
Immature Grans (Abs): 0 10*3/uL (ref 0.0–0.1)
Immature Granulocytes: 0 %
Lymphs: 27 %
MCV: 90 fL (ref 79–97)
Neutrophils Absolute: 4.2 10*3/uL (ref 1.4–7.0)
RBC: 4.85 x10E6/uL (ref 3.77–5.28)
RDW: 13.6 % (ref 11.7–15.4)

## 2022-09-03 LAB — CMP14+EGFR
Alkaline Phosphatase: 81 IU/L (ref 44–121)
Bilirubin Total: 0.5 mg/dL (ref 0.0–1.2)
CO2: 19 mmol/L — ABNORMAL LOW (ref 20–29)
Calcium: 9.7 mg/dL (ref 8.7–10.3)
Chloride: 102 mmol/L (ref 96–106)
Globulin, Total: 2.7 g/dL (ref 1.5–4.5)
Sodium: 138 mmol/L (ref 134–144)

## 2022-09-03 LAB — LIPID PANEL
Cholesterol, Total: 168 mg/dL (ref 100–199)
HDL: 45 mg/dL (ref >39)
LDL Chol Calc (NIH): 105 mg/dL — ABNORMAL HIGH (ref 0–99)
VLDL Cholesterol Cal: 18 mg/dL (ref 5–40)

## 2022-09-29 ENCOUNTER — Encounter (HOSPITAL_COMMUNITY): Payer: Self-pay | Admitting: Psychiatry

## 2022-09-29 ENCOUNTER — Telehealth (INDEPENDENT_AMBULATORY_CARE_PROVIDER_SITE_OTHER): Payer: BC Managed Care – PPO | Admitting: Psychiatry

## 2022-09-29 DIAGNOSIS — F5105 Insomnia due to other mental disorder: Secondary | ICD-10-CM

## 2022-09-29 DIAGNOSIS — F23 Brief psychotic disorder: Secondary | ICD-10-CM

## 2022-09-29 MED ORDER — ARIPIPRAZOLE 2 MG PO TABS: 1.0000 mg | ORAL_TABLET | Freq: Every day | ORAL | 5 refills | Status: DC

## 2022-09-29 NOTE — Progress Notes (Signed)
Virtual Visit via Telephone Note  I connected with Alejandra Harrison on 09/29/22 at  9:00 AM EDT by telephone and verified that I am speaking with the correct person using two identifiers.  Location: Patient: home Provider: office   I discussed the limitations, risks, security and privacy concerns of performing an evaluation and management service by telephone and the availability of in person appointments. I also discussed with the patient that there may be a patient responsible charge related to this service. The patient expressed understanding and agreed to proceed.      I discussed the assessment and treatment plan with the patient. The patient was provided an opportunity to ask questions and all were answered. The patient agreed with the plan and demonstrated an understanding of the instructions.   The patient was advised to call back or seek an in-person evaluation if the symptoms worsen or if the condition fails to improve as anticipated.  I provided 15 minutes of non-face-to-face time during this encounter.   Alejandra Ruder, MD  [facility] MD/PA/NP OP Progress Note  09/29/2022 9:18 AM Alejandra Harrison  MRN:  [id number]  Chief Complaint:  Chief Complaint  Patient presents with   Hallucinations   Follow-up   HPI: This patient is a 62 year old married white female who lives with her husband and 2 sons in [place]. 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   The patient states that in 2012 she started seeing a figure that she thought may have been God. She also got fixated on a man in her Sunday school and thought that her husband would die and she would have to marry this man. She was having lots of religious visions. She claims that she had not been on any medication or using drugs or alcohol at that time. She had not had a head injury or any additional stress. She may of been going through menopause. She denied visual hallucinations but claims she felt "high and on top of  the world." She's never had a history of depression however.   At any rate she was seen here by Dr. Toni Harrison and started on Abilify. She was on a higher dose such as 5 mg in the past and it caused akathisia. Currently she takes 1 mg per day and it seems to be working fairly well. She's no longer having visions or unusual thoughts  Patient returns for follow-up after 6 months.  She continues to do well.  She denies any delusions or paranoid thinking or hallucinations.  [name] sleeping well.  She denies depression.  She denies twitching jerking akathisia or side effects from Abilify.  The 1 mg at bedtime seems to be doing just fine for her.  She has not had any new cardiac events.  Her recent physical was good and all her laboratories were normal. Visit Diagnosis:    ICD-10-CM   1. 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 ARIPiprazole (ABILIFY) 2 MG tablet      Past Psychiatric History: none  Past Medical History:  Past Medical History:  Diagnosis Date   Anxiety    AV block, complete (HCC)    History of seasonal allergies    Psychosis (HCC)     Past Surgical History:  Procedure Laterality Date   COLONOSCOPY N/A 09/28/2015   Procedure: COLONOSCOPY;  Surgeon: Alejandra Bali, MD;  Location: [facility];  Service: Endoscopy;  Laterality: N/A;  1030   EP IMPLANTABLE DEVICE N/A 05/17/2015   Procedure: Pacemaker Implant;  Surgeon: Alejandra Jorja Loa, MD;  Location: [facility] INVASIVE CV LAB;  Service: Cardiovascular;  Laterality: N/A;   INSERT / REPLACE / REMOVE PACEMAKER  05/17/2015   TUBAL LIGATION      Family Psychiatric History: See below  Family History:  Family History  Problem Relation Age of Onset   Dementia Mother    Diabetes Father    Breast cancer Paternal Aunt    ADD / ADHD Neg Hx    Alcohol abuse Neg Hx    Drug abuse Neg Hx    Anxiety disorder Neg Hx    Bipolar disorder Neg Hx    Depression Neg Hx    OCD Neg Hx    Paranoid behavior  Neg Hx    Schizophrenia Neg Hx    Seizures Neg Hx    Sexual abuse Neg Hx    Physical abuse Neg Hx    Colon cancer Neg Hx     Social History:  Social History   Socioeconomic History   Marital status: Married    Spouse name: Not on file   Number of children: Not on file   Years of education: Not on file   Highest education level: Not on file  Occupational History   Not on file  Tobacco Use   Smoking status: Never   Smokeless tobacco: Never   Tobacco comments:    Never smoked  Vaping Use   Vaping status: Never Used  Substance and Sexual Activity   Alcohol use: No    Alcohol/week: 0.0 standard drinks of alcohol   Drug use: No   Sexual activity: Not Currently    Birth control/protection: Post-menopausal  Other Topics Concern   Not on file  Social History Narrative   Not on file   Social Determinants of Health   Financial Resource Strain: Not on file  Food Insecurity: Not on file  Transportation Needs: Not on file  Physical Activity: Not on file  Stress: Not on file  Social Connections: Not on file    Allergies:  Allergies  Allergen Reactions   Oxycodone-Acetaminophen Hives and Other (See Comments)    Hands peeled   Penicillins Hives and Other (See Comments)    Hands peeled    Metabolic Disorder Labs: No results found for: "HGBA1C", "MPG" No results found for: "PROLACTIN" Lab Results  Component Value Date   CHOL 168 09/02/2022   TRIG 98 09/02/2022   HDL 45 09/02/2022   CHOLHDL 3.7 09/02/2022   LDLCALC 105 (H) 09/02/2022   LDLCALC 106 (H) 08/30/2021   Lab Results  Component Value Date   TSH 2.250 09/02/2022   TSH 2.420 08/30/2021    Therapeutic Level Labs: No results found for: "LITHIUM" No results found for: "VALPROATE" No results found for: "CBMZ"  Current Medications: Current Outpatient Medications  Medication Sig Dispense Refill   ARIPiprazole (ABILIFY) 2 MG tablet Take 0.5 tablets (1 mg total) by mouth daily. 30 tablet 5   metoprolol  tartrate (LOPRESSOR) 50 MG tablet Take 1 tablet (50 mg total) by mouth 2 (two) times daily. 180 tablet 3   No current facility-administered medications for this visit.     Musculoskeletal: Strength & Muscle Tone: na Gait & Station: na Patient leans: N/A  Psychiatric Specialty Exam: Review of Systems  All other systems reviewed and are negative.   There were no vitals taken for this visit.There is no height or weight  on file to calculate BMI.  General Appearance: NA  Eye Contact:  NA  Speech:  Clear and Coherent  Volume:  Normal  Mood:  Euthymic  Affect:  NA  Thought Process:  Goal Directed  Orientation:  Full (Time, Place, and Person)  Thought Content: WDL   Suicidal Thoughts:  No  Homicidal Thoughts:  No  Memory:  Immediate;   Good Recent;   Good Remote;   Good  Judgement:  Good  Insight:  Fair  Psychomotor Activity:  Normal  Concentration:  Concentration: Good and Attention Span: Good  Recall:  Good  Fund of Knowledge: Good  Language: Good  Akathisia:  No  Handed:  Right  AIMS (if indicated): not done  Assets:  Communication Skills Desire for Improvement Resilience Social Support  ADL's:  Intact  Cognition: WNL  Sleep:  Good   Screenings: GAD-7    Flowsheet Row Office Visit from 09/02/2022 in [facility] Office Visit from 08/06/2022 in [facility] Office Visit from 05/15/2021 in [facility] Office Visit from 08/28/2020 in [facility] Office Visit from 08[facility]10/2020 in [facility]  Total GAD-7 Score 0 0 0 0 0      PHQ2-9    Flowsheet Row Office Visit from 09/02/2022 in [facility] Office Visit from 08/06/2022 in [facility] Office Visit from 08/30/2021 in [facility] Office Visit from 05/15/2021 in  [facility] Office Visit from 08/28/2020 in [facility]  PHQ-2 Total Score 0 0 0 0 0  PHQ-9 Total Score 0 0 -- 0 0        Assessment and Plan: This patient is a 62 year old female with a past episode of psychosis.  She has been maintained well on Abilify 1 mg daily without relapse or side effects.  She Alejandra continue this dosage and return to see me in 6 months  Collaboration of Care: Collaboration of Care: Primary Care Provider AEB notes are shared with PCP on the epic system  Patient/Guardian was advised Release of Information must be obtained prior to any record release in order to collaborate their care with an outside provider. Patient/Guardian was advised if they have not already done so to contact the registration department to sign all necessary forms in order for [place] to release information regarding their care.   Consent: Patient/Guardian gives verbal consent for treatment and assignment of benefits for services provided during this visit. Patient/Guardian expressed understanding and agreed to proceed.    Alejandra Ruder, MD 09/29/2022, 9:18 AM

## 2022-10-21 ENCOUNTER — Ambulatory Visit (INDEPENDENT_AMBULATORY_CARE_PROVIDER_SITE_OTHER): Payer: BC Managed Care – PPO

## 2022-10-21 DIAGNOSIS — I442 Atrioventricular block, complete: Secondary | ICD-10-CM | POA: Diagnosis not present

## 2022-10-21 LAB — CUP PACEART REMOTE DEVICE CHECK
Battery Remaining Longevity: 37 mo
Battery Remaining Percentage: 29 %
Battery Voltage: 2.9 V
Brady Statistic AP VP Percent: 15 %
Brady Statistic AP VS Percent: 1 %
Brady Statistic AS VP Percent: 85 %
Brady Statistic AS VS Percent: 1 %
Brady Statistic RA Percent Paced: 14 %
Brady Statistic RV Percent Paced: 99 %
Date Time Interrogation Session: 20240827020015
Implantable Lead Connection Status: 753985
Implantable Lead Connection Status: 753985
Implantable Lead Implant Date: 20170323
Implantable Lead Implant Date: 20170323
Implantable Lead Location: 753859
Implantable Lead Location: 753860
Implantable Pulse Generator Implant Date: 20170323
Lead Channel Impedance Value: 390 Ohm
Lead Channel Impedance Value: 530 Ohm
Lead Channel Pacing Threshold Amplitude: 0.5 V
Lead Channel Pacing Threshold Amplitude: 0.875 V
Lead Channel Pacing Threshold Pulse Width: 0.4 ms
Lead Channel Pacing Threshold Pulse Width: 0.4 ms
Lead Channel Sensing Intrinsic Amplitude: 12 mV
Lead Channel Sensing Intrinsic Amplitude: 3.9 mV
Lead Channel Setting Pacing Amplitude: 1.125
Lead Channel Setting Pacing Amplitude: 1.5 V
Lead Channel Setting Pacing Pulse Width: 0.4 ms
Lead Channel Setting Sensing Sensitivity: 4 mV
Pulse Gen Model: 2272
Pulse Gen Serial Number: 3134337

## 2022-10-22 ENCOUNTER — Inpatient Hospital Stay: Admission: RE | Admit: 2022-10-22 | Payer: BC Managed Care – PPO | Source: Ambulatory Visit

## 2022-10-22 DIAGNOSIS — Z1231 Encounter for screening mammogram for malignant neoplasm of breast: Secondary | ICD-10-CM

## 2022-10-30 ENCOUNTER — Ambulatory Visit: Payer: BC Managed Care – PPO | Admitting: Nurse Practitioner

## 2022-10-30 ENCOUNTER — Encounter: Payer: Self-pay | Admitting: Nurse Practitioner

## 2022-10-30 VITALS — BP 120/71 | HR 79 | Temp 97.3°F | Ht 64.0 in | Wt 251.4 lb

## 2022-10-30 DIAGNOSIS — H6501 Acute serous otitis media, right ear: Secondary | ICD-10-CM | POA: Diagnosis not present

## 2022-10-30 MED ORDER — AZITHROMYCIN 250 MG PO TABS
ORAL_TABLET | ORAL | 0 refills | Status: AC
Start: 2022-10-30 — End: 2022-11-04

## 2022-10-30 NOTE — Progress Notes (Signed)
Acute Office Visit  Subjective:     Patient ID: Alejandra Harrison, female    DOB: Jul 20, 1960, 62 y.o.   MRN: [id number]  Chief Complaint  Patient presents with   Ear Drainage    Right ear pain and drainage started on Saturday.     HPI TIKI BACHRACH here today complains of pain in right ear for 4 days. 62 year old female presents with a chief complaint of right ear pain and drainage that began on Sunday. The patient describes the ear pain as a constant, throbbing sensation that has progressively worsened since its onset. The drainage is described as yellowish and moderately profuse, with a noticeable odor. She reports associated symptoms a feeling of fullness in the ear, but denies any hearing loss or dizziness, fever. The patient has a history of occasional ear infections last one was a year ago. She recently used over-the-counter ear drops with no relief.   Active Ambulatory Problems    Diagnosis Date Noted   Psychosis (HCC) 08/29/2011   Insomnia due to mental disorder 04/02/2012   Post-menopause 08/29/2013   Bradycardia 05/07/2015   CHB (complete heart block) (HCC)    Rectal bleeding 09/06/2015   History of colonic polyps 12/20/2015   GERD (gastroesophageal reflux disease) 12/20/2015   Morbid obesity (HCC) 06/08/2017   Atrophic vaginitis 08/12/2018   Depression, major, single episode, mild (HCC) 08/30/2021   GAD (generalized anxiety disorder) 08/30/2021   Non-recurrent acute serous otitis media of right ear 10/30/2022   Resolved Ambulatory Problems    Diagnosis Date Noted   Vitamin D deficiency 03/15/2015   Heart block 05/17/2015   Constipation 09/06/2015   Special screening for malignant neoplasms, colon 09/06/2015   Vitamin B 12 deficiency 06/09/2017   Past Medical History:  Diagnosis Date   Anxiety    AV block, complete (HCC)    History of seasonal allergies        10/30/2022    8:40 AM 09/02/2022    8:18 AM 08/06/2022   10:17 AM  PHQ9 SCORE ONLY  PHQ-9 Total Score 0 0  0    Review of Systems  Constitutional:  Negative for chills and fever.  HENT:  Positive for ear discharge and ear pain. Negative for hearing loss.        Right ear  Eyes:  Negative for double vision and pain.  Respiratory:  Positive for cough. Negative for wheezing and stridor.   Cardiovascular:  Negative for chest pain and leg swelling.  Gastrointestinal:  Negative for blood in stool, constipation, nausea and vomiting.  Musculoskeletal:  Negative for falls and myalgias.  Neurological:  Negative for dizziness and headaches.  Endo/Heme/Allergies:  Negative for environmental allergies and polydipsia. Does not bruise/bleed easily.  Psychiatric/Behavioral:  Negative for hallucinations and suicidal ideas. The patient does not have insomnia.    Negative unless indicated in HPI    Objective:    BP 120/71   Pulse 79   Temp (!) 97.3 F (36.3 C) (Temporal)   Ht 5\' 4"  (1.626 m)   Wt 251 lb 6.4 oz (114 kg)   SpO2 97%   BMI 43.15 kg/m  BP Readings from Last 3 Encounters:  10/30/22 120/71  09/02/22 104/76  08/12/22 134/84   Wt Readings from Last 3 Encounters:  10/30/22 251 lb 6.4 oz (114 kg)  09/02/22 249 lb 9.6 oz (113.2 kg)  08/12/22 247 lb 3.2 oz (112.1 kg)    Physical Exam Vitals and nursing note reviewed.  Constitutional:  Appearance: Normal appearance. [name] obese.  HENT:     Head: Normocephalic and atraumatic.     Right Ear: Drainage and tenderness present. Tympanic membrane is erythematous.     Left Ear: Tympanic membrane, ear canal and external ear normal.  Eyes:     Extraocular Movements: Extraocular movements intact.     Conjunctiva/sclera: Conjunctivae normal.     Pupils: Pupils are equal, round, and reactive to light.  Cardiovascular:     Rate and Rhythm: Normal rate and regular rhythm.  Pulmonary:     Effort: Pulmonary effort is normal.     Breath sounds: Normal breath sounds.  Skin:    General: Skin is warm and dry.     Findings: No rash.   Neurological:     Mental Status: [name] alert and oriented to person, place, and time. Mental status is at baseline.  Psychiatric:        Mood and Affect: Mood normal.        Behavior: Behavior normal.        Thought Content: Thought content normal.        Judgment: Judgment normal.    No results found for any visits on 10/30/22.      Assessment & Plan:  Non-recurrent acute serous otitis media of right ear -     Azithromycin; Take 2 tablets on day 1, then 1 tablet daily on days 2 through 5  Dispense: 6 tablet; Refill: 0   Annagrace is 62 yrs old Caucasian female, no acute distress Right ear OM, Zithromax 500 mg on 1st day and 250 for 4 days. Take ATB until done Avoid placing Q-tips inside of ear   Encourage healthy lifestyle choices, including diet (rich in fruits, vegetables, and lean proteins, and low in salt and simple carbohydrates) and exercise (at least 30 minutes of moderate physical activity daily).     The above assessment and management plan was discussed with the patient. The patient verbalized understanding of and has agreed to the management plan. Patient is aware to call the clinic if they develop any new symptoms or if symptoms persist or worsen. Patient is aware when to return to the clinic for a follow-up visit. Patient educated on when it is appropriate to go to the emergency department.      Otitis Media With Effusion, Adult  Otitis media with effusion (OME) is inflammation and fluid (effusion) in the middle ear without having an ear infection. The middle ear is the space behind the eardrum. The middle ear is connected to the back of the throat by a narrow tube (eustachian tube). Normally the eustachian tube drains fluid out of the middle ear. A swollen eustachian tube can become blocked and cause fluid to collect in the middle ear. OME often goes away without treatment. Sometimes OME can lead to hearing problems and recurrent acute ear infections (acute otitis  media). These conditions may require treatment. What are the causes? OME is caused by a blocked eustachian tube. This can result from: Allergies. Upper respiratory infections. Enlarged adenoids. The adenoids are areas of soft tissue located high in the back of the throat, behind the nose and the roof of the mouth. They are part of the body's natural defense system (immune system). Rapid changes in pressure, like when an airplane is descending or during scuba diving. In some cases, the cause of this condition is not known. What are the signs or symptoms? Common symptoms of this condition include: A feeling of  fullness in your ear. Decreased hearing in the affected ear. Fluid draining into the ear canal. Pain in the ear. In some cases, there are no symptoms. How is this diagnosed?  A health care provider can diagnose OME based on signs and symptoms of the condition. Your provider will also do a physical exam to check for fluid behind the eardrum. During the exam, your health care provider will use an instrument called an otoscope to look in your ear. Your health care provider may do other tests, such as: A hearing test. A tympanogram. This is a test that shows how well the eardrum moves in response to air pressure in the ear canal. It provides a graph for your health care provider to review. A pneumatic otoscopy. This is a test to check how your eardrum moves in response to changes in pressure. It is done by squeezing a small amount of air into the ear. How is this treated? Treatment for OME depends on the cause of the condition and the severity of symptoms. The first step is often waiting to see if the fluid drains on its own in a few weeks. Home care treatment may include: Over-the-counter pain relievers. A warm, moist cloth placed over the ear. Severe cases may require a procedure to insert tubes in the ears (tympanostomy tubes) to drain the fluid. Follow these instructions at home: Take  over-the-counter and prescription medicines only as told by your health care provider. Keep all follow-up visits. Contact a health care provider if: You have pain that gets worse. Hearing in your affected ear gets worse. You have fluid draining from your ear canal. You have dizziness. You develop a fever. Get help right away if: You develop a severe headache. You completely lose hearing in the affected ear. You have bleeding from your ear canal. You have sudden and severe pain in your ear. These symptoms may represent a serious problem that is an emergency. Do not wait to see if the symptoms will go away. Get medical help right away. Call your local emergency services (911 in the [place] Do not drive yourself to the hospital. Summary Otitis media with effusion (OME) is inflammation and fluid (effusion) in the middle ear without having an ear infection. A swollen eustachian tube can become blocked and cause fluid to collect in the middle ear. Treatment for OME depends on the cause of the condition and the severity of symptoms. Many times, treatment is not needed because the fluid drains on its own in a few weeks. Sometimes OME can lead to hearing problems and recurrent acute ear infections (acute otitis media), which may require treatment. This information is not intended to replace advice given to you by your health care provider. Make sure you discuss any questions you have with your health care provider. Document Revised: 06/07/2020 Document Reviewed: 06/07/2020 Elsevier Patient Education  2024 [employer].  Return if symptoms worsen or fail to improve.  Arrie Aran Santa Lighter, DNP [facility] [address] [place], [place] [address] [phone]

## 2022-11-03 NOTE — Progress Notes (Signed)
Remote pacemaker transmission.   

## 2023-01-05 ENCOUNTER — Ambulatory Visit (INDEPENDENT_AMBULATORY_CARE_PROVIDER_SITE_OTHER): Payer: BC Managed Care – PPO

## 2023-01-05 DIAGNOSIS — Z23 Encounter for immunization: Secondary | ICD-10-CM

## 2023-01-09 ENCOUNTER — Telehealth: Payer: BC Managed Care – PPO | Admitting: Family Medicine

## 2023-01-09 ENCOUNTER — Telehealth: Payer: Self-pay | Admitting: Family Medicine

## 2023-01-09 DIAGNOSIS — H6691 Otitis media, unspecified, right ear: Secondary | ICD-10-CM | POA: Diagnosis not present

## 2023-01-09 MED ORDER — AZITHROMYCIN 250 MG PO TABS
ORAL_TABLET | ORAL | 0 refills | Status: AC
Start: 2023-01-09 — End: 2023-01-14

## 2023-01-09 NOTE — Telephone Encounter (Signed)
Copied from CRM [id number]. Topic: Clinical - Medication Refill >> Jan 09, 2023  9:22 AM Adelina Mings wrote: Most Recent Primary Care Visit:  Provider: [facility]  Department: [facility] MED  Visit Type: FLU SHOT  Date: 01/05/2023  Medication:    Azithromycin; Take 2 tablets on day 1, then 1 tablet daily on days 2 through 5  Dispense: 6 tablet; Refill: 0  Has the patient contacted their pharmacy? No (Agent: If no, request that the patient contact the pharmacy for the refill. If patient does not wish to contact the pharmacy document the reason why and proceed with request.) (Agent: If yes, when and what did the pharmacy advise?)  Is this the correct pharmacy for this prescription? Yes If no, delete pharmacy and type the correct one.  This is the patient's preferred pharmacy:  [facility] [address] [place] - [address] [place] [address] [place] [address] Phone: [phone] Fax: [phone]   Has the prescription been filled recently? No  Is the patient out of the medication? Yes  Has the patient been seen for an appointment in the last year OR does the patient have an upcoming appointment? Yes  Can we respond through MyChart? No  Agent: Please be advised that Rx refills may take up to 3 business days. We ask that you follow-up with your pharmacy.

## 2023-01-09 NOTE — Telephone Encounter (Signed)
Patient aware we can not give abx with out appt. She will get evisit

## 2023-01-09 NOTE — Progress Notes (Signed)
E-Visit for Ear Pain - Acute Otitis Media   We are sorry that you are not feeling well. Here is how we plan to help!  Based on what you have shared with me it looks like you have Acute Otitis Media.  Acute Otitis Media is an infection of the middle or "inner" ear. This type of infection can cause redness, inflammation, and fluid buildup behind the tympanic membrane (ear drum).  The usual symptoms include: Earache/Pain Fever Upper respiratory symptoms Lack of energy/Fatigue/Malaise Slight hearing loss gradually worsening- if the inner ear fills with fluid What causes middle ear infections? Most middle ear infections occur when an infection such as a cold, leads to a build-up of mucus in the middle ear and causes the Eustachian tube (a thin tube that runs from the middle ear to the back of the nose) to become swollen or blocked.   This means mucus can't drain away properly, making it easier for an infection to spread into the middle ear.  How middle ear infections are treated: Most ear infections clear up within three to five days and don't need any specific treatment. If necessary, tylenol or ibuprofen should be used to relieve pain and a high temperature.  If you develop a fever higher than 102, or any significantly worsening symptoms, this could indicate a more serious infection moving to the middle/inner and needs face to face evaluation in an office by a provider.   Antibiotics aren't routinely used to treat middle ear infections, although they may occasionally be prescribed if symptoms persist or are particularly severe. Given your presentation,   I have prescribed Azithromycin 250 mg two tablets by mouth on day 1, then 1 tablet by mouth daily until completed     Your symptoms should improve over the next 3 days and should resolve in about 7 days. Be sure to complete ALL of the prescription(s) given.  HOME CARE: Wash your hands frequently. If you are prescribed an ear drop, do not  place the tip of the bottle on your ear or touch it with your fingers. You can take Acetaminophen 650 mg every 4-6 hours as needed for pain.  If pain is severe or moderate, you can apply a heating pad (set on low) or hot water bottle (wrapped in a towel) to outer ear for 20 minutes.  This will also increase drainage.  GET HELP RIGHT AWAY IF: Fever is over 102.2 degrees. You develop progressive ear pain or hearing loss. Ear symptoms persist longer than 3 days after treatment.  MAKE SURE YOU: Understand these instructions. Will watch your condition. Will get help right away if you are not doing well or get worse.  Thank you for choosing an e-visit.  Your e-visit answers were reviewed by a board certified advanced clinical practitioner to complete your personal care plan. Depending upon the condition, your plan could have included both over the counter or prescription medications.  Please review your pharmacy choice. Make sure the pharmacy is open so you can pick up the prescription now. If there is a problem, you may contact your provider through [employer] and have the prescription routed to another pharmacy.  Your safety is important to [place]. If you have drug allergies check your prescription carefully.   For the next 24 hours you can use MyChart to ask questions about today's visit, request a non-urgent call back, or ask for a work or school excuse. You will get an email with a survey after your eVisit asking  about your experience. We would appreciate your feedback. I hope that your e-visit has been valuable and will aid in your recovery.     have provided 5 minutes of non face to face time during this encounter for chart review and documentation.

## 2023-01-20 ENCOUNTER — Ambulatory Visit (INDEPENDENT_AMBULATORY_CARE_PROVIDER_SITE_OTHER): Payer: BC Managed Care – PPO

## 2023-01-20 DIAGNOSIS — I442 Atrioventricular block, complete: Secondary | ICD-10-CM | POA: Diagnosis not present

## 2023-01-20 LAB — CUP PACEART REMOTE DEVICE CHECK
Battery Remaining Longevity: 35 mo
Battery Remaining Percentage: 27 %
Battery Voltage: 2.89 V
Brady Statistic AP VP Percent: 15 %
Brady Statistic AP VS Percent: 1 %
Brady Statistic AS VP Percent: 85 %
Brady Statistic AS VS Percent: 1 %
Brady Statistic RA Percent Paced: 15 %
Brady Statistic RV Percent Paced: 99 %
Date Time Interrogation Session: 20241126020014
Implantable Lead Connection Status: 753985
Implantable Lead Connection Status: 753985
Implantable Lead Implant Date: 20170323
Implantable Lead Implant Date: 20170323
Implantable Lead Location: 753859
Implantable Lead Location: 753860
Implantable Pulse Generator Implant Date: 20170323
Lead Channel Impedance Value: 390 Ohm
Lead Channel Impedance Value: 530 Ohm
Lead Channel Pacing Threshold Amplitude: 0.5 V
Lead Channel Pacing Threshold Amplitude: 0.875 V
Lead Channel Pacing Threshold Pulse Width: 0.4 ms
Lead Channel Pacing Threshold Pulse Width: 0.4 ms
Lead Channel Sensing Intrinsic Amplitude: 12 mV
Lead Channel Sensing Intrinsic Amplitude: 2.8 mV
Lead Channel Setting Pacing Amplitude: 1.125
Lead Channel Setting Pacing Amplitude: 1.5 V
Lead Channel Setting Pacing Pulse Width: 0.4 ms
Lead Channel Setting Sensing Sensitivity: 4 mV
Pulse Gen Model: 2272
Pulse Gen Serial Number: 3134337

## 2023-02-19 NOTE — Progress Notes (Signed)
Remote pacemaker transmission.   

## 2023-04-01 ENCOUNTER — Telehealth (HOSPITAL_COMMUNITY): Payer: 59 | Admitting: Psychiatry

## 2023-04-10 ENCOUNTER — Encounter (HOSPITAL_COMMUNITY): Payer: Self-pay | Admitting: Psychiatry

## 2023-04-10 ENCOUNTER — Telehealth (HOSPITAL_COMMUNITY): Payer: 59 | Admitting: Psychiatry

## 2023-04-10 DIAGNOSIS — F23 Brief psychotic disorder: Secondary | ICD-10-CM

## 2023-04-10 DIAGNOSIS — F5105 Insomnia due to other mental disorder: Secondary | ICD-10-CM

## 2023-04-10 MED ORDER — ARIPIPRAZOLE 2 MG PO TABS: 1.0000 mg | ORAL_TABLET | Freq: Every day | ORAL | 5 refills | Status: DC

## 2023-04-10 NOTE — Progress Notes (Signed)
Virtual Visit via Telephone Note  I connected with Alejandra Harrison on 04/10/23 at  9:00 AM EST by telephone and verified that I am speaking with the correct person using two identifiers.  Location: Patient: home Provider: office   I discussed the limitations, risks, security and privacy concerns of performing an evaluation and management service by telephone and the availability of in person appointments. I also discussed with the patient that there may be a patient responsible charge related to this service. The patient expressed understanding and agreed to proceed.     I discussed the assessment and treatment plan with the patient. The patient was provided an opportunity to ask questions and all were answered. The patient agreed with the plan and demonstrated an understanding of the instructions.   The patient was advised to call back or seek an in-person evaluation if the symptoms worsen or if the condition fails to improve as anticipated.  I provided 20 minutes of non-face-to-face time during this encounter.   Alejandra Ruder, MD  [facility] MD/PA/NP OP Progress Note  04/10/2023 9:16 AM LELAR FAREWELL  MRN:  [id number]  Chief Complaint:  Psychosis HPI: This patient is a 63 year old married white female who lives with her husband and 2 sons in [place]. 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   The patient states that in 2012 she started seeing a figure that she thought may have been God. She also got fixated on a man in her Sunday school and thought that her husband would die and she would have to marry this man. She was having lots of religious visions. She claims that she had not been on any medication or using drugs or alcohol at that time. She had not had a head injury or any additional stress. She may of been going through menopause. She denied visual hallucinations but claims she felt "high and on top of the world." She's never had a history of depression however.    At any rate she was seen here by Dr. Toni Arthurs and started on Abilify. She was on a higher dose such as 5 mg in the past and it caused akathisia. Currently she takes 1 mg per day and it seems to be working fairly well. She's no longer having visions or unusual thoughts  The patient returns for follow-up after 6 months.  She states that she is still working and things are going well with her job.  Her mood has been good and she denies significant depression.  She denies any delusional thoughts auditory visual hallucinations or paranoia.  [name] sleeping well.  She still has a pacemaker implanted and has had no change in her cardiac status.  She denies any side effects from the Abilify. Visit Diagnosis:    ICD-10-CM   1. 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 ARIPiprazole (ABILIFY) 2 MG tablet      Past Psychiatric History: none  Past Medical History:  Past Medical History:  Diagnosis Date   Anxiety    AV block, complete (HCC)    History of seasonal allergies    Psychosis (HCC)     Past Surgical History:  Procedure Laterality Date   COLONOSCOPY N/A 09/28/2015   Procedure: COLONOSCOPY;  Surgeon: West Bali, MD;  Location: [facility] ENDO SUITE;  Service: Endoscopy;  Laterality: N/A;  1030   EP IMPLANTABLE DEVICE N/A 05/17/2015   Procedure: Pacemaker  Implant;  Surgeon: Will Jorja Loa, MD;  Location: [facility] INVASIVE CV LAB;  Service: Cardiovascular;  Laterality: N/A;   INSERT / REPLACE / REMOVE PACEMAKER  05/17/2015   TUBAL LIGATION      Family Psychiatric History: See below Family History:  Family History  Problem Relation Age of Onset   Dementia Mother    Diabetes Father    Breast cancer Paternal Aunt    ADD / ADHD Neg Hx    Alcohol abuse Neg Hx    Drug abuse Neg Hx    Anxiety disorder Neg Hx    Bipolar disorder Neg Hx    Depression Neg Hx    OCD Neg Hx    Paranoid behavior Neg Hx    Schizophrenia Neg Hx    Seizures Neg Hx    Sexual  abuse Neg Hx    Physical abuse Neg Hx    Colon cancer Neg Hx     Social History:  Social History   Socioeconomic History   Marital status: Married    Spouse name: Not on file   Number of children: Not on file   Years of education: Not on file   Highest education level: Not on file  Occupational History   Not on file  Tobacco Use   Smoking status: Never   Smokeless tobacco: Never   Tobacco comments:    Never smoked  Vaping Use   Vaping status: Never Used  Substance and Sexual Activity   Alcohol use: No    Alcohol/week: 0.0 standard drinks of alcohol   Drug use: No   Sexual activity: Not Currently    Birth control/protection: Post-menopausal  Other Topics Concern   Not on file  Social History Narrative   Not on file   Social Drivers of Health   Financial Resource Strain: Not on file  Food Insecurity: Not on file  Transportation Needs: Not on file  Physical Activity: Not on file  Stress: Not on file  Social Connections: Not on file    Allergies:  Allergies  Allergen Reactions   Oxycodone-Acetaminophen Hives and Other (See Comments)    Hands peeled   Penicillins Hives and Other (See Comments)    Hands peeled    Metabolic Disorder Labs: No results found for: "HGBA1C", "MPG" No results found for: "PROLACTIN" Lab Results  Component Value Date   CHOL 168 09/02/2022   TRIG 98 09/02/2022   HDL 45 09/02/2022   CHOLHDL 3.7 09/02/2022   LDLCALC 105 (H) 09/02/2022   LDLCALC 106 (H) 08/30/2021   Lab Results  Component Value Date   TSH 2.250 09/02/2022   TSH 2.420 08/30/2021    Therapeutic Level Labs: No results found for: "LITHIUM" No results found for: "VALPROATE" No results found for: "CBMZ"  Current Medications: Current Outpatient Medications  Medication Sig Dispense Refill   ARIPiprazole (ABILIFY) 2 MG tablet Take 0.5 tablets (1 mg total) by mouth daily. 30 tablet 5   metoprolol tartrate (LOPRESSOR) 50 MG tablet Take 1 tablet (50 mg total) by mouth  2 (two) times daily. 180 tablet 3   No current facility-administered medications for this visit.     Musculoskeletal: Strength & Muscle Tone: na Gait & Station: na Patient leans: N/A  Psychiatric Specialty Exam: Review of Systems  All other systems reviewed and are negative.   There were no vitals taken for this visit.There is no height or weight on file to calculate BMI.  General Appearance: na  Eye Contact:  NA  Speech:  Clear and Coherent  Volume:  Normal  Mood:  Euthymic  Affect:  NA  Thought Process:  Goal Directed  Orientation:  Full (Time, Place, and Person)  Thought Content: WDL   Suicidal Thoughts:  No  Homicidal Thoughts:  No  Memory:  Immediate;   Good Recent;   Good Remote;   NA  Judgement:  Good  Insight:  Fair  Psychomotor Activity:  Normal  Concentration:  Concentration: Good and Attention Span: Good  Recall:  Good  Fund of Knowledge: Good  Language: Good  Akathisia:  No  Handed:  Right  AIMS (if indicated): not done  Assets:  Communication Skills Desire for Improvement Physical Health Resilience Social Support  ADL's:  Intact  Cognition: WNL  Sleep:  Good   Screenings: GAD-7    Flowsheet Row Office Visit from 10/30/2022 in [facility] Office Visit from 09/02/2022 in [facility] Office Visit from 08/06/2022 in [facility] Office Visit from 05/15/2021 in [facility] Office Visit from 08/28/2020 in [facility]  Total GAD-7 Score 0 0 0 0 0      PHQ2-9    Flowsheet Row Office Visit from 10/30/2022 in [facility] Office Visit from 09/02/2022 in [facility] Office Visit from 08/06/2022 in [facility] Office Visit from 08/30/2021 in [facility]  Visit from 05/15/2021 in [facility]  PHQ-2 Total Score 0 0 0 0 0  PHQ-9 Total Score 0 0 0 -- 0        Assessment and Plan: This patient is a 63 year old female has had an episode of psychosis in the past.  She has been maintained well on Abilify 1 mg nightly without relapse or side effects.  She will continue this regimen and return to see me in 6 months  Collaboration of Care: Collaboration of Care: Primary Care Provider AEB notes are shared with PCP on the epic system  Patient/Guardian was advised Release of Information must be obtained prior to any record release in order to collaborate their care with an outside provider. Patient/Guardian was advised if they have not already done so to contact the registration department to sign all necessary forms in order for [place] to release information regarding their care.   Consent: Patient/Guardian gives verbal consent for treatment and assignment of benefits for services provided during this visit. Patient/Guardian expressed understanding and agreed to proceed.    Alejandra Ruder, MD 04/10/2023, 9:16 AM

## 2023-04-21 ENCOUNTER — Ambulatory Visit (INDEPENDENT_AMBULATORY_CARE_PROVIDER_SITE_OTHER): Payer: BC Managed Care – PPO

## 2023-04-21 DIAGNOSIS — I442 Atrioventricular block, complete: Secondary | ICD-10-CM | POA: Diagnosis not present

## 2023-04-22 LAB — CUP PACEART REMOTE DEVICE CHECK
Battery Remaining Longevity: 32 mo
Battery Remaining Percentage: 25 %
Battery Voltage: 2.87 V
Brady Statistic AP VP Percent: 13 %
Brady Statistic AP VS Percent: 1 %
Brady Statistic AS VP Percent: 87 %
Brady Statistic AS VS Percent: 1 %
Brady Statistic RA Percent Paced: 12 %
Brady Statistic RV Percent Paced: 99 %
Date Time Interrogation Session: 20250225020015
Implantable Lead Connection Status: 753985
Implantable Lead Connection Status: 753985
Implantable Lead Implant Date: 20170323
Implantable Lead Implant Date: 20170323
Implantable Lead Location: 753859
Implantable Lead Location: 753860
Implantable Pulse Generator Implant Date: 20170323
Lead Channel Impedance Value: 390 Ohm
Lead Channel Impedance Value: 590 Ohm
Lead Channel Pacing Threshold Amplitude: 0.5 V
Lead Channel Pacing Threshold Amplitude: 0.75 V
Lead Channel Pacing Threshold Pulse Width: 0.4 ms
Lead Channel Pacing Threshold Pulse Width: 0.4 ms
Lead Channel Sensing Intrinsic Amplitude: 12 mV
Lead Channel Sensing Intrinsic Amplitude: 3.4 mV
Lead Channel Setting Pacing Amplitude: 1 V
Lead Channel Setting Pacing Amplitude: 1.5 V
Lead Channel Setting Pacing Pulse Width: 0.4 ms
Lead Channel Setting Sensing Sensitivity: 4 mV
Pulse Gen Model: 2272
Pulse Gen Serial Number: 3134337

## 2023-05-27 NOTE — Addendum Note (Signed)
 Addended by: Geralyn Flash D on: 05/27/2023 03:36 PM   Modules accepted: Orders

## 2023-05-27 NOTE — Progress Notes (Signed)
 Remote pacemaker transmission.

## 2023-07-21 ENCOUNTER — Ambulatory Visit (INDEPENDENT_AMBULATORY_CARE_PROVIDER_SITE_OTHER): Payer: BC Managed Care – PPO

## 2023-07-21 DIAGNOSIS — I442 Atrioventricular block, complete: Secondary | ICD-10-CM | POA: Diagnosis not present

## 2023-07-22 LAB — CUP PACEART REMOTE DEVICE CHECK
Battery Remaining Longevity: 29 mo
Battery Remaining Percentage: 22 %
Battery Voltage: 2.87 V
Brady Statistic AP VP Percent: 13 %
Brady Statistic AP VS Percent: 1 %
Brady Statistic AS VP Percent: 87 %
Brady Statistic AS VS Percent: 1 %
Brady Statistic RA Percent Paced: 12 %
Brady Statistic RV Percent Paced: 99 %
Date Time Interrogation Session: 20250527020013
Implantable Lead Connection Status: 753985
Implantable Lead Connection Status: 753985
Implantable Lead Implant Date: 20170323
Implantable Lead Implant Date: 20170323
Implantable Lead Location: 753859
Implantable Lead Location: 753860
Implantable Pulse Generator Implant Date: 20170323
Lead Channel Impedance Value: 410 Ohm
Lead Channel Impedance Value: 530 Ohm
Lead Channel Pacing Threshold Amplitude: 0.5 V
Lead Channel Pacing Threshold Amplitude: 0.625 V
Lead Channel Pacing Threshold Pulse Width: 0.4 ms
Lead Channel Pacing Threshold Pulse Width: 0.4 ms
Lead Channel Sensing Intrinsic Amplitude: 12 mV
Lead Channel Sensing Intrinsic Amplitude: 3 mV
Lead Channel Setting Pacing Amplitude: 0.875
Lead Channel Setting Pacing Amplitude: 1.5 V
Lead Channel Setting Pacing Pulse Width: 0.4 ms
Lead Channel Setting Sensing Sensitivity: 4 mV
Pulse Gen Model: 2272
Pulse Gen Serial Number: 3134337

## 2023-07-26 ENCOUNTER — Ambulatory Visit: Payer: Self-pay | Admitting: Cardiology

## 2023-08-14 ENCOUNTER — Ambulatory Visit (INDEPENDENT_AMBULATORY_CARE_PROVIDER_SITE_OTHER)

## 2023-08-14 ENCOUNTER — Encounter: Payer: Self-pay | Admitting: Nurse Practitioner

## 2023-08-14 ENCOUNTER — Ambulatory Visit: Payer: Self-pay | Admitting: Nurse Practitioner

## 2023-08-14 VITALS — BP 131/83 | HR 69 | Temp 97.9°F | Ht 64.0 in | Wt 247.0 lb

## 2023-08-14 DIAGNOSIS — R1032 Left lower quadrant pain: Secondary | ICD-10-CM

## 2023-08-14 MED ORDER — CIPROFLOXACIN HCL 500 MG PO TABS: 500.0000 mg | ORAL_TABLET | Freq: Two times a day (BID) | ORAL | 0 refills | Status: DC

## 2023-08-14 MED ORDER — METRONIDAZOLE 500 MG PO TABS: 500.0000 mg | ORAL_TABLET | Freq: Two times a day (BID) | ORAL | 0 refills | Status: AC

## 2023-08-14 NOTE — Progress Notes (Signed)
 Subjective:    Patient ID: Alejandra Harrison, female    DOB: 12-05-1960, 63 y.o.   MRN: [id number]   Chief Complaint: Abdominal Pain (Left side. Having diarrhea)   Abdominal Pain Associated symptoms include diarrhea. Pertinent negatives include no constipation, fever, nausea or vomiting.    Patient in c/o left abdominal pain that started about 2-3 day ago. Hurst more today. Is intermittent. Can feel funny when walking. Pain is better when she is sitting still. No fever. Some diarrhea . No nausea. Rates pain 8/10 when occurs.  Patient Active Problem List   Diagnosis Date Noted   Non-recurrent acute serous otitis media of right ear 10/30/2022   Depression, major, single episode, mild (HCC) 08/30/2021   GAD (generalized anxiety disorder) 08/30/2021   Atrophic vaginitis 08/12/2018   Morbid obesity (HCC) 06/08/2017   History of colonic polyps 12/20/2015   GERD (gastroesophageal reflux disease) 12/20/2015   Rectal bleeding 09/06/2015   CHB (complete heart block) (HCC)    Bradycardia 05/07/2015   Post-menopause 08/29/2013   Insomnia due to mental disorder 04/02/2012   Psychosis (HCC) 08/29/2011       Review of Systems  Constitutional:  Negative for chills and fever.  Gastrointestinal:  Positive for abdominal pain and diarrhea. Negative for constipation, nausea and vomiting.       Objective:   Physical Exam Constitutional:      Appearance: [name] well-developed. [name] obese.   Cardiovascular:     Rate and Rhythm: Normal rate and regular rhythm.  Pulmonary:     Effort: Pulmonary effort is normal.     Breath sounds: Normal breath sounds.  Abdominal:     Tenderness: There is abdominal tenderness (LLQ on plapation). There is guarding. There is no rebound.     Comments: Slow bowel sounds   Skin:    General: Skin is warm.   Neurological:     General: No focal deficit present.     Mental Status: [name] alert and oriented to person, place, and time.   Psychiatric:         Mood and Affect: Mood normal.        Behavior: Behavior normal.    BP 131/83   Pulse 69   Temp 97.9 F (36.6 C) (Temporal)   Ht 5' 4 (1.626 m)   Wt 247 lb (112 kg)   SpO2 95%   BMI 42.40 kg/m         Assessment & Plan:  Alejandra Harrison in today with chief complaint of Abdominal Pain (Left side. Having diarrhea)   1. Left lower quadrant abdominal pain (Primary) Probably diverticulitis Will treat- does not want CT scan at this time If pain worsens over weekend needs to go to the ED Avoid foods with small seeds or non digestible skin - DG Abd 1 View - ciprofloxacin  (CIPRO ) 500 MG tablet; Take 1 tablet (500 mg total) by mouth 2 (two) times daily.  Dispense: 14 tablet; Refill: 0 - metroNIDAZOLE (FLAGYL) 500 MG tablet; Take 1 tablet (500 mg total) by mouth 2 (two) times daily for 7 days.  Dispense: 14 tablet; Refill: 0    The above assessment and management plan was discussed with the patient. The patient verbalized understanding of and has agreed to the management plan. Patient is aware to call the clinic if symptoms persist or worsen. Patient is aware when to return to the clinic for a follow-up visit. Patient educated on when it is appropriate to go to the  emergency department.   Mary-Margaret Gaylyn Keas, FNP

## 2023-08-14 NOTE — Patient Instructions (Signed)
 Diverticulitis  Diverticulitis is when small pouches in your colon get infected or swollen. This causes pain in your belly (abdomen) and watery poop (diarrhea). The small pouches are called diverticula. They may form if you have a condition called diverticulosis. What are the causes? You may get this condition if poop (stool) gets trapped in the pouches in your colon. The poop lets germs (bacteria) grow. This causes an infection. What increases the risk? You are more likely to get this condition if you have small pouches in your colon. You are also more likely to get it if: You are overweight or very overweight (obese). You do not exercise enough. You drink alcohol. You smoke. You eat a lot of red meat, like beef, pork, or lamb. You do not eat enough fiber. You are older than 64 years of age. What are the signs or symptoms? Pain in your belly. Pain is often on the left side, but it may be felt in other spots too. Fever and chills. Feeling like you may vomit. Vomiting. Having cramps. Feeling full. Changes in how often you poop. Blood in your poop. How is this treated? Most cases are treated at home. You may be told to: Take over-the-counter pain medicines. Only eat and drink clear liquids. Take antibiotics. Rest. Very bad cases may need to be treated at a hospital. Treatment may include: Not eating or drinking. Taking pain medicines. Getting antibiotics through an IV tube. Getting fluid and food through an IV tube. Having surgery. When you are feeling better, you may need to have a test to look at your colon (colonoscopy). Follow these instructions at home: Medicines Take over-the-counter and prescription medicines only as told by your doctor. These include: Fiber pills. Probiotics. Medicines to make your poop soft (stool softeners). If you were prescribed antibiotics, take them as told by your doctor. Do not stop taking them even if you start to feel better. Ask your  doctor if you should avoid driving or using machines while you are taking your medicine. Eating and drinking  Follow the diet told by your doctor. You may need to only eat and drink liquids. When you feel better, you may be able to eat more foods. You may also be told to eat a lot of fiber. Fiber helps you poop. Foods with fiber include berries, beans, lentils, and green vegetables. Try not to eat red meat. General instructions Do not smoke or use any products that contain nicotine or tobacco. If you need help quitting, ask your doctor. Exercise 3 or more times a week. Try to go for 30 minutes each time. Exercise enough to sweat and make your heart beat faster. Contact a doctor if: Your pain gets worse. You are not pooping like normal. Your symptoms do not get better. Your symptoms get worse very fast. You have a fever. You vomit more than one time. You have poop that is: Bloody. Black. Tarry. This information is not intended to replace advice given to you by your health care provider. Make sure you discuss any questions you have with your health care provider. Document Revised: 11/07/2021 Document Reviewed: 11/07/2021 Elsevier Patient Education  2024 [employer].

## 2023-08-17 ENCOUNTER — Encounter: Payer: Self-pay | Admitting: Cardiology

## 2023-08-17 ENCOUNTER — Ambulatory Visit: Payer: Self-pay | Attending: Cardiology | Admitting: Cardiology

## 2023-08-17 VITALS — BP 110/66 | HR 77 | Ht 64.0 in | Wt 247.0 lb

## 2023-08-17 DIAGNOSIS — I442 Atrioventricular block, complete: Secondary | ICD-10-CM | POA: Diagnosis not present

## 2023-08-17 DIAGNOSIS — I4729 Other ventricular tachycardia: Secondary | ICD-10-CM | POA: Diagnosis not present

## 2023-08-17 DIAGNOSIS — Z950 Presence of cardiac pacemaker: Secondary | ICD-10-CM

## 2023-08-17 NOTE — Progress Notes (Signed)
  Electrophysiology Office Note:   Date:  08/17/2023  ID:  Alejandra Harrison, DOB 1960/10/28, MRN [id number]  Primary Cardiologist: None Primary Heart Failure: None Electrophysiologist: Shubh Chiara Gladis Norton, MD      History of Present Illness:   Alejandra Harrison is a 63 y.o. female with h/o complete heart block, nonsustained VT seen today for routine electrophysiology followup.   Since last being seen in our clinic the patient reports doing well.  She has no chest pain or shortness of breath.  Is able to all her daily activities.  She has had no syncope, near syncope, dizziness, lightheadedness.  she denies chest pain, palpitations, dyspnea, PND, orthopnea, nausea, vomiting, dizziness, syncope, edema, weight gain, or early satiety.   Review of systems complete and found to be negative unless listed in HPI.      EP Information / Studies Reviewed:    EKG is ordered today. Personal review as below.  EKG Interpretation Date/Time:  Monday August 17 2023 16:06:28 EDT Ventricular Rate:  77 PR Interval:  192 QRS Duration:  150 QT Interval:  426 QTC Calculation: 482 R Axis:   260  Text Interpretation: Atrial-sensed ventricular-paced rhythm When compared with ECG of 12-Aug-2022 10:32, No significant change since last tracing Confirmed by Korbin Mapps ([id number]) on 08/17/2023 5:01:28 PM   PPM Interrogation-  reviewed in detail today,  See PACEART report.  Device History: Abbott Dual Chamber PPM implanted 05/17/2015 for CHB  Risk Assessment/Calculations:             Physical Exam:   VS:  BP 110/66 (BP Location: Right Arm, Patient Position: Sitting, Cuff Size: Large)   Pulse 77   Ht 5' 4 (1.626 m)   Wt 247 lb (112 kg)   SpO2 95%   BMI 42.40 kg/m    Wt Readings from Last 3 Encounters:  08/17/23 247 lb (112 kg)  08/14/23 247 lb (112 kg)  10/30/22 251 lb 6.4 oz (114 kg)     GEN: Well nourished, well developed in no acute distress NECK: No JVD; No carotid bruits CARDIAC: Regular rate and  rhythm, no murmurs, rubs, gallops RESPIRATORY:  Clear to auscultation without rales, wheezing or rhonchi  ABDOMEN: Soft, non-tender, non-distended EXTREMITIES:  No edema; No deformity   ASSESSMENT AND PLAN:    CHB s/p Abbott PPM  Normal PPM function Sensing, threshold, impedance within normal limits Programming appropriate See Pace Art report No changes today Atrial lead noise noted.  Reproducible with arm movements.  No other changes in lead parameters.  Alejandra Harrison continue monitoring for now.  2.  Nonsustained VT: Minimal noted on device interrogation.  Continue with current management.  Disposition:   Follow up with EP APP in 6 months  Signed, Alejandra Harrison Gladis Norton, MD

## 2023-08-18 LAB — CUP PACEART INCLINIC DEVICE CHECK
Date Time Interrogation Session: 20250623160000
Implantable Lead Connection Status: 753985
Implantable Lead Connection Status: 753985
Implantable Lead Implant Date: 20170323
Implantable Lead Implant Date: 20170323
Implantable Lead Location: 753859
Implantable Lead Location: 753860
Implantable Pulse Generator Implant Date: 20170323
Pulse Gen Model: 2272
Pulse Gen Serial Number: 3134337

## 2023-08-19 ENCOUNTER — Ambulatory Visit: Payer: Self-pay | Admitting: Cardiology

## 2023-08-24 ENCOUNTER — Ambulatory Visit: Payer: Self-pay | Admitting: Nurse Practitioner

## 2023-08-30 ENCOUNTER — Other Ambulatory Visit: Payer: Self-pay | Admitting: Physician Assistant

## 2023-08-30 DIAGNOSIS — I4729 Other ventricular tachycardia: Secondary | ICD-10-CM

## 2023-09-07 NOTE — Addendum Note (Signed)
 Addended by: TAWNI DRILLING D on: 09/07/2023 04:56 PM   Modules accepted: Orders

## 2023-09-07 NOTE — Progress Notes (Signed)
 Remote pacemaker transmission.

## 2023-09-11 ENCOUNTER — Other Ambulatory Visit: Payer: Self-pay | Admitting: Family

## 2023-09-11 DIAGNOSIS — Z0000 Encounter for general adult medical examination without abnormal findings: Secondary | ICD-10-CM

## 2023-09-28 ENCOUNTER — Ambulatory Visit: Payer: Self-pay

## 2023-09-28 ENCOUNTER — Ambulatory Visit: Admitting: Family

## 2023-09-28 ENCOUNTER — Encounter: Payer: Self-pay | Admitting: Family

## 2023-09-28 VITALS — BP 117/75 | HR 64 | Temp 98.2°F | Ht 64.0 in | Wt 249.0 lb

## 2023-09-28 DIAGNOSIS — L03032 Cellulitis of left toe: Secondary | ICD-10-CM

## 2023-09-28 MED ORDER — SULFAMETHOXAZOLE-TRIMETHOPRIM 800-160 MG PO TABS: 1.0000 | ORAL_TABLET | Freq: Two times a day (BID) | ORAL | 0 refills | Status: DC

## 2023-09-28 NOTE — Progress Notes (Signed)
 Subjective:    Patient ID: Alejandra Harrison, female    DOB: 1960-12-08, 63 y.o.   MRN: [id number]  Chief Complaint  Patient presents with   Toe Pain    Right foot big toe    Toe Pain    Pt presents to the office today with left great toe nail pain. Reports she was cutting her toenails 3 days ago and woke up yesterday with redness and pain. Reports aching, pulsating pain 7-8 out 10.   Has not taken any OTC mediations.   No hx of gout.    Review of Systems  All other systems reviewed and are negative.   Social History   Socioeconomic History   Marital status: Married    Spouse name: Not on file   Number of children: Not on file   Years of education: Not on file   Highest education level: Not on file  Occupational History   Not on file  Tobacco Use   Smoking status: Never   Smokeless tobacco: Never   Tobacco comments:    Never smoked  Vaping Use   Vaping status: Never Used  Substance and Sexual Activity   Alcohol use: No    Alcohol/week: 0.0 standard drinks of alcohol   Drug use: No   Sexual activity: Not Currently    Birth control/protection: Post-menopausal  Other Topics Concern   Not on file  Social History Narrative   Not on file   Social Drivers of Health   Financial Resource Strain: Not on file  Food Insecurity: Not on file  Transportation Needs: Not on file  Physical Activity: Not on file  Stress: Not on file  Social Connections: Not on file   Family History  Problem Relation Age of Onset   Dementia Mother    Diabetes Father    Breast cancer Paternal Aunt    ADD / ADHD Neg Hx    Alcohol abuse Neg Hx    Drug abuse Neg Hx    Anxiety disorder Neg Hx    Bipolar disorder Neg Hx    Depression Neg Hx    OCD Neg Hx    Paranoid behavior Neg Hx    Schizophrenia Neg Hx    Seizures Neg Hx    Sexual abuse Neg Hx    Physical abuse Neg Hx    Colon cancer Neg Hx         Objective:   Physical Exam Vitals reviewed.  Constitutional:      General:  [name] not in acute distress.    Appearance: [name] well-developed. [name] obese.  HENT:     Head: Normocephalic and atraumatic.  Eyes:     Pupils: Pupils are equal, round, and reactive to light.  Neck:     Thyroid : No thyromegaly.  Cardiovascular:     Rate and Rhythm: Normal rate and regular rhythm.     Heart sounds: Normal heart sounds. No murmur heard. Pulmonary:     Effort: Pulmonary effort is normal. No respiratory distress.     Breath sounds: Normal breath sounds. No wheezing.  Abdominal:     General: Bowel sounds are normal. There is no distension.     Palpations: Abdomen is soft.     Tenderness: There is no abdominal tenderness.  Musculoskeletal:        General: Tenderness present.     Cervical back: Normal range of motion and neck supple.     Right foot: No foot drop.  Feet:  Feet:     Comments: Erythemas, tenderness and mild swelling of left medial nail bed Skin:    General: Skin is warm and dry.  Neurological:     Mental Status: [name] alert and oriented to person, place, and time.     Cranial Nerves: No cranial nerve deficit.     Deep Tendon Reflexes: Reflexes are normal and symmetric.  Psychiatric:        Behavior: Behavior normal.        Thought Content: Thought content normal.        Judgment: Judgment normal.       BP 117/75   Pulse 64   Temp 98.2 F (36.8 C)   Ht 5' 4 (1.626 m)   Wt 249 lb (112.9 kg)   SpO2 93%   BMI 42.74 kg/m      Assessment & Plan:  Alejandra Harrison comes in today with chief complaint of Toe Pain (Right foot big toe)   Diagnosis and orders addressed:  1. Paronychia of great toe, left (Primary) Soak foot in warm water  Avoid picking  Start Bactrim  BID  Follow up if symptoms worsen or do not improve  - sulfamethoxazole -trimethoprim  (BACTRIM  DS) 800-160 MG tablet; Take 1 tablet by mouth 2 (two) times daily.  Dispense: 14 tablet; Refill: 0      Bari Learn, FNP

## 2023-09-28 NOTE — Telephone Encounter (Signed)
 FYI Only or Action Required?: FYI only for provider.  Patient was last seen in primary care on 08/14/2023 by Gladis Mustard, FNP.  Called Nurse Triage reporting No chief complaint on file..  Symptoms began yesterday .  Interventions attempted: Rest, hydration, or home remedies.  Symptoms are: gradually worsening.  Triage Disposition: No disposition on file.  Patient/caregiver understands and will follow disposition?:    Copied from CRM [id number]. Topic: Clinical - Red Word Triage >> Sep 28, 2023  8:00 AM Mia F wrote: Red Word that prompted transfer to Nurse Triage: Toe pain and discoloration. Pt says she was clipping her toe nails and think she may have clipped her skin but not sure. She says the pain started the next day and she noticed slight swelling and some redness. She says she is not sure if it came from clipping or not because it did not hurt until the next day. She says the pain is worsening. Pt says she feels some throbbing. Pt says it does her for her to walk on it. Reason for Disposition  [1] Swollen toe AND [2] no fever  (Exceptions: Just a localized bump from bunion, corns, insect bite, sting.)  Answer Assessment - Initial Assessment Questions 1. ONSET: When did the pain start?      Yesterday afternoon  2. LOCATION: Where is the pain located?   (e.g., around nail, entire toe, at foot joint)      Great Toe on the Left Foot  3. PAIN: How bad is the pain?    (Scale 1-10; or mild, moderate, severe)     7-8, Pulsating   4. APPEARANCE: What does the toe look like? (e.g., redness, swelling, bruising, pallor)    Reddened on the tip of the toe, back of the toe, and in between the Great Toe and 2nd toe  5. CAUSE: What do you think is causing the toe pain?     Unsure, previously clipped the toenails on Friday  6. OTHER SYMPTOMS: Do you have any other symptoms? (e.g., leg pain, rash, fever, numbness)     Warmth, Redness, Swelling  7. PREGNANCY: Is there  any chance you are pregnant? When was your last menstrual period?     No and No  Protocols used: Toe Pain-A-AH

## 2023-09-28 NOTE — Telephone Encounter (Signed)
 Pt has appt

## 2023-09-28 NOTE — Patient Instructions (Signed)
 Paronychia Paronychia is an infection of the skin that surrounds a nail. It usually affects the skin around a fingernail, but it may also occur near a toenail. It often causes pain and swelling around the nail. In some cases, a collection of pus (abscess) can form near or under the nail.  This condition may develop suddenly, or it may develop gradually over a longer period. In most cases, paronychia is not serious, and it will clear up with treatment. What are the causes? This condition may be caused by bacteria or a fungus, such as yeast. The bacteria or fungus can enter the body through an opening in the skin, such as a cut or a hangnail, and cause an infection in your fingernail or toenail. Other causes may include: Recurrent injury to the fingernail or toenail area. Irritation of the base and sides of the nail (cuticle). Injury and irritation can result in inflammation, swelling, and thickened skin around the nail. What increases the risk? This condition is more likely to develop in people who: Get their hands wet often, such as those who work as Fish farm manager, bartenders, or housekeepers. Bite their fingernails or cuticles. Have underlying skin conditions. Have hangnails or injured fingertips. Are exposed to irritants like detergents and other chemicals. Have diabetes. What are the signs or symptoms? Symptoms of this condition include: Redness and swelling of the skin near the nail. Tenderness around the nail when you touch the area. Pus-filled bumps under the cuticle. Fluid or pus under the nail. Throbbing pain in the area. How is this diagnosed? This condition is diagnosed with a physical exam. In some cases, a sample of pus may be tested to determine what type of bacteria or fungus is causing the condition. How is this treated? Treatment depends on the cause and severity of your condition. If your condition is mild, it may clear up on its own in a few days or after soaking in warm  water. If needed, treatment may include: Antibiotic medicine, if your infection is caused by bacteria. Antifungal medicine, if your infection is caused by a fungus. A procedure to drain pus from an abscess. Anti-inflammatory medicine (corticosteroids). Removal of part of an ingrown toenail. A bandage (dressing) may be placed over the affected area if an abscess or part of a nail has been removed. Follow these instructions at home: Wound care Keep the affected area clean. Soak the affected area in warm water if told to do so by your health care provider. You may be told to do this for 20 minutes, 2-3 times a day. Keep the area dry when you are not soaking it. Do not try to drain an abscess yourself. Follow instructions from your health care provider about how to take care of the affected area. Make sure you: Wash your hands with soap and water for at least 20 seconds before and after you change your dressing. If soap and water are not available, use hand sanitizer. Change your dressing as told by your health care provider. If you had an abscess drained, check the area every day for signs of infection. Check for: Redness, swelling, or pain. Fluid or blood. Warmth. Pus or a bad smell. Medicines  Take over-the-counter and prescription medicines only as told by your health care provider. If you were prescribed an antibiotic medicine, take it as told by your health care provider. Do not stop taking the antibiotic even if you start to feel better. General instructions Avoid contact with any skin irritants or allergens.  Do not pick at the affected area. Keep all follow-up visits as told. This is important. Prevention To prevent this condition from happening again: Wear rubber gloves when washing dishes or doing other tasks that require your hands to get wet. Wear gloves if your hands might come in contact with cleaners or other chemicals. Avoid injuring your nails or fingertips. Do not bite  your nails or tear hangnails. Do not cut your nails very short. Do not cut your cuticles. Use clean nail clippers or scissors when trimming nails. Contact a health care provider if: Your symptoms get worse or do not improve with treatment. You have continued or increased fluid, blood, or pus coming from the affected area. Your affected finger, toe, or joint becomes swollen or difficult to move. You have a fever or chills. There is redness spreading away from the affected area. Summary Paronychia is an infection of the skin that surrounds a nail. It often causes pain and swelling around the nail. In some cases, a collection of pus (abscess) can form near or under the nail. This condition may be caused by bacteria or a fungus. These germs can enter the body through an opening in the skin, such as a cut or a hangnail. If your condition is mild, it may clear up on its own in a few days. If needed, treatment may include medicine or a procedure to drain pus from an abscess. To prevent this condition from happening again, wear gloves if doing tasks that require your hands to get wet or to come in contact with chemicals. Also avoid injuring your nails or fingertips. This information is not intended to replace advice given to you by your health care provider. Make sure you discuss any questions you have with your health care provider. Document Revised: 05/14/2020 Document Reviewed: 05/14/2020 Elsevier Patient Education  2024 [employer].

## 2023-10-08 ENCOUNTER — Encounter (HOSPITAL_COMMUNITY): Payer: Self-pay | Admitting: Psychiatry

## 2023-10-08 ENCOUNTER — Telehealth (INDEPENDENT_AMBULATORY_CARE_PROVIDER_SITE_OTHER): Payer: 59 | Admitting: Psychiatry

## 2023-10-08 DIAGNOSIS — F23 Brief psychotic disorder: Secondary | ICD-10-CM

## 2023-10-08 DIAGNOSIS — F5105 Insomnia due to other mental disorder: Secondary | ICD-10-CM

## 2023-10-08 MED ORDER — ARIPIPRAZOLE 2 MG PO TABS: 1.0000 mg | ORAL_TABLET | Freq: Every day | ORAL | 5 refills | Status: AC

## 2023-10-08 NOTE — Progress Notes (Signed)
 Virtual Visit via Telephone Note  I connected with Alejandra Harrison on 10/08/23 at  9:00 AM EDT by telephone and verified that I am speaking with the correct person using two identifiers.  Location: Patient: home Provider: office   I discussed the limitations, risks, security and privacy concerns of performing an evaluation and management service by telephone and the availability of in person appointments. I also discussed with the patient that there may be a patient responsible charge related to this service. The patient expressed understanding and agreed to proceed.       I discussed the assessment and treatment plan with the patient. The patient was provided an opportunity to ask questions and all were answered. The patient agreed with the plan and demonstrated an understanding of the instructions.   The patient was advised to call back or seek an in-person evaluation if the symptoms worsen or if the condition fails to improve as anticipated.  I provided 20 minutes of non-face-to-face time during this encounter.   Barnie Gull, MD  [facility] MD/PA/NP OP Progress Note  10/08/2023 9:20 AM Alejandra Harrison  MRN:  [id number]  Chief Complaint:  Chief Complaint  Patient presents with   Hallucinations   Follow-up   HPI: This patient is a 63 year old married white female who lives with her husband and 2 sons in [place]. 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   The patient states that in 2012 she started seeing a figure that she thought may have been God. She also got fixated on a man in her Sunday school and thought that her husband would die and she would have to marry this man. She was having lots of religious visions. She claims that she had not been on any medication or using drugs or alcohol at that time. She had not had a head injury or any additional stress. She may of been going through menopause. She denied visual hallucinations but claims she felt high  and on top of the world. She's never had a history of depression however.   At any rate she was seen here by Dr. Melba and started on Abilify . She was on a higher dose such as 5 mg in the past and it caused akathisia. Currently she takes 1 mg per day and it seems to be working fairly well. 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  She continues to do well.  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  She has been spending time cleaning her house spending time with her husband and crocheting.  So far she is enjoying retirement.  She has not had any return in her psychotic symptoms such as delusions or hallucinations.  She denies significant depression.  [name] sleeping well.  So far her cardiac status has been good with the pacemaker. 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 ARIPiprazole  (ABILIFY ) 2 MG tablet      Past Psychiatric History: none  Past Medical History:  Past Medical History:  Diagnosis Date   Anxiety    AV block, complete (HCC)    History of seasonal allergies    Psychosis (HCC)     Past Surgical History:  Procedure Laterality Date   COLONOSCOPY N/A 09/28/2015   Procedure: COLONOSCOPY;  Surgeon: Margo LITTIE Haddock, MD;  Location: [facility];  Service: Endoscopy;  Laterality: N/A;  1030   EP IMPLANTABLE DEVICE N/A 05/17/2015   Procedure: Pacemaker Implant;  Surgeon: Will Gladis Norton, MD;  Location: [facility] INVASIVE CV LAB;  Service: Cardiovascular;  Laterality: N/A;   INSERT / REPLACE / REMOVE PACEMAKER  05/17/2015   TUBAL LIGATION      Family Psychiatric History: See below  Family History:  Family History  Problem Relation Age of Onset   Dementia Mother    Diabetes Father    Breast cancer Paternal Aunt    ADD / ADHD Neg Hx    Alcohol abuse Neg Hx    Drug abuse Neg Hx    Anxiety disorder Neg Hx    Bipolar disorder Neg Hx    Depression Neg Hx     OCD Neg Hx    Paranoid behavior Neg Hx    Schizophrenia Neg Hx    Seizures Neg Hx    Sexual abuse Neg Hx    Physical abuse Neg Hx    Colon cancer Neg Hx     Social History:  Social History   Socioeconomic History   Marital status: Married    Spouse name: Not on file   Number of children: Not on file   Years of education: Not on file   Highest education level: Not on file  Occupational History   Not on file  Tobacco Use   Smoking status: Never   Smokeless tobacco: Never   Tobacco comments:    Never smoked  Vaping Use   Vaping status: Never Used  Substance and Sexual Activity   Alcohol use: No    Alcohol/week: 0.0 standard drinks of alcohol   Drug use: No   Sexual activity: Not Currently    Birth control/protection: Post-menopausal  Other Topics Concern   Not on file  Social History Narrative   Not on file   Social Drivers of Health   Financial Resource Strain: Not on file  Food Insecurity: Not on file  Transportation Needs: Not on file  Physical Activity: Not on file  Stress: Not on file  Social Connections: Not on file    Allergies:  Allergies  Allergen Reactions   Oxycodone-Acetaminophen  Hives and Other (See Comments)    Hands peeled   Penicillins Hives and Other (See Comments)    Hands peeled    Metabolic Disorder Labs: No results found for: HGBA1C, MPG No results found for: PROLACTIN Lab Results  Component Value Date   CHOL 168 09/02/2022   TRIG 98 09/02/2022   HDL 45 09/02/2022   CHOLHDL 3.7 09/02/2022   LDLCALC 105 (H) 09/02/2022   LDLCALC 106 (H) 08/30/2021   Lab Results  Component Value Date   TSH 2.250 09/02/2022   TSH 2.420 08/30/2021    Therapeutic Level Labs: No results found for: LITHIUM No results found for: VALPROATE No results found for: CBMZ  Current Medications: Current Outpatient Medications  Medication Sig Dispense Refill   ARIPiprazole  (ABILIFY ) 2 MG tablet Take 0.5 tablets (1 mg total) by mouth  daily. 30 tablet 5   metoprolol  tartrate (LOPRESSOR ) 50 MG tablet Take 1 tablet by mouth twice daily 180 tablet 3   sulfamethoxazole -trimethoprim  (BACTRIM  DS) 800-160 MG tablet Take 1 tablet by mouth 2 (two) times daily. 14 tablet 0   No current facility-administered medications for this visit.     Musculoskeletal: Strength & Muscle Tone: na Gait & Station: na Patient leans: N/A  Psychiatric Specialty Exam: Review of Systems  All other systems reviewed  and are negative.   There were no vitals taken for this visit.There is no height or weight on file to calculate BMI.  General Appearance: NA  Eye Contact:  NA  Speech:  Clear and Coherent  Volume:  Normal  Mood:  Euthymic  Affect:  Congruent  Thought Process:  Goal Directed  Orientation:  Full (Time, Place, and Person)  Thought Content: WDL   Suicidal Thoughts:  No  Homicidal Thoughts:  No  Memory:  Immediate;   Good Recent;   Good Remote;   NA  Judgement:  Good  Insight:  Fair  Psychomotor Activity:  Normal  Concentration:  Concentration: Good and Attention Span: Good  Recall:  Good  Fund of Knowledge: Good  Language: Good  Akathisia:  No  Handed:  Right  AIMS (if indicated): not done  Assets:  Communication Skills Desire for Improvement Resilience Social Support  ADL's:  Intact  Cognition: WNL  Sleep:  Good   Screenings: GAD-7    Flowsheet Row Office Visit from 09/28/2023 in [facility] Office Visit from 10/30/2022 in [facility] Visit from 09/02/2022 in [facility] Office Visit from 08/06/2022 in [facility] Office Visit from 05/15/2021 in [facility]  Total GAD-7 Score 0 0 0 0 0   PHQ2-9    Flowsheet Row Office Visit from 09/28/2023 in [facility] Office Visit from 08/14/2023 in [facility] Office Visit from 10/30/2022 in [facility] Office Visit from 09/02/2022 in [facility] Office Visit from 08/06/2022 in [facility]  PHQ-2 Total Score 0 0 0 0 0  PHQ-9 Total Score 0 -- 0 0 0     Assessment and Plan: This patient is a 63 year old female who has had 1 episode of psychosis in the past.  She has been maintained well on Abilify  1 mg nightly without relapse or side effects.  She will continue this regimen and return to see me in 6 months  Collaboration of Care: Collaboration of Care: Primary Care Provider AEB notes that shared with PCP on the epic system  Patient/Guardian was advised Release of Information must be obtained prior to any record release in order to collaborate their care with an outside provider. Patient/Guardian was advised if they have not already done so to contact the registration department to sign all necessary forms in order for us  to release information regarding their care.   Consent: Patient/Guardian gives verbal consent for treatment and assignment of benefits for services provided during this visit. Patient/Guardian expressed understanding and agreed to proceed.    Barnie Gull, MD 10/08/2023, 9:20 AM

## 2023-10-20 ENCOUNTER — Ambulatory Visit (INDEPENDENT_AMBULATORY_CARE_PROVIDER_SITE_OTHER): Payer: BC Managed Care – PPO

## 2023-10-20 DIAGNOSIS — I442 Atrioventricular block, complete: Secondary | ICD-10-CM | POA: Diagnosis not present

## 2023-10-21 ENCOUNTER — Ambulatory Visit: Payer: Self-pay | Admitting: Cardiology

## 2023-10-21 LAB — CUP PACEART REMOTE DEVICE CHECK
Battery Remaining Longevity: 25 mo
Battery Remaining Percentage: 20 %
Battery Voltage: 2.86 V
Brady Statistic AP VP Percent: 12 %
Brady Statistic AP VS Percent: 1 %
Brady Statistic AS VP Percent: 88 %
Brady Statistic AS VS Percent: 1 %
Brady Statistic RA Percent Paced: 12 %
Brady Statistic RV Percent Paced: 99 %
Date Time Interrogation Session: 20250826020016
Implantable Lead Connection Status: 753985
Implantable Lead Connection Status: 753985
Implantable Lead Implant Date: 20170323
Implantable Lead Implant Date: 20170323
Implantable Lead Location: 753859
Implantable Lead Location: 753860
Implantable Pulse Generator Implant Date: 20170323
Lead Channel Impedance Value: 380 Ohm
Lead Channel Impedance Value: 550 Ohm
Lead Channel Pacing Threshold Amplitude: 0.5 V
Lead Channel Pacing Threshold Amplitude: 0.75 V
Lead Channel Pacing Threshold Pulse Width: 0.4 ms
Lead Channel Pacing Threshold Pulse Width: 0.4 ms
Lead Channel Sensing Intrinsic Amplitude: 12 mV
Lead Channel Sensing Intrinsic Amplitude: 3.6 mV
Lead Channel Setting Pacing Amplitude: 1 V
Lead Channel Setting Pacing Amplitude: 1.5 V
Lead Channel Setting Pacing Pulse Width: 0.4 ms
Lead Channel Setting Sensing Sensitivity: 4 mV
Pulse Gen Model: 2272
Pulse Gen Serial Number: 3134337

## 2023-11-02 ENCOUNTER — Ambulatory Visit
Admission: RE | Admit: 2023-11-02 | Discharge: 2023-11-02 | Disposition: A | Discharge status: Home / Self Care | Source: Ambulatory Visit | Attending: Family | Admitting: Family

## 2023-11-02 DIAGNOSIS — Z0000 Encounter for general adult medical examination without abnormal findings: Secondary | ICD-10-CM

## 2023-11-10 NOTE — Progress Notes (Signed)
 Remote PPM Transmission

## 2023-11-17 ENCOUNTER — Ambulatory Visit: Admitting: Family

## 2023-11-17 ENCOUNTER — Encounter: Payer: Self-pay | Admitting: Family

## 2023-11-17 VITALS — BP 135/80 | HR 69 | Temp 97.3°F | Ht 64.0 in | Wt 251.8 lb

## 2023-11-17 DIAGNOSIS — Z0001 Encounter for general adult medical examination with abnormal findings: Secondary | ICD-10-CM | POA: Diagnosis not present

## 2023-11-17 DIAGNOSIS — K219 Gastro-esophageal reflux disease without esophagitis: Secondary | ICD-10-CM

## 2023-11-17 DIAGNOSIS — F411 Generalized anxiety disorder: Secondary | ICD-10-CM

## 2023-11-17 DIAGNOSIS — Z0000 Encounter for general adult medical examination without abnormal findings: Secondary | ICD-10-CM

## 2023-11-17 DIAGNOSIS — I442 Atrioventricular block, complete: Secondary | ICD-10-CM | POA: Diagnosis not present

## 2023-11-17 DIAGNOSIS — F5105 Insomnia due to other mental disorder: Secondary | ICD-10-CM | POA: Diagnosis not present

## 2023-11-17 DIAGNOSIS — F320 Major depressive disorder, single episode, mild: Secondary | ICD-10-CM

## 2023-11-17 DIAGNOSIS — Z23 Encounter for immunization: Secondary | ICD-10-CM | POA: Diagnosis not present

## 2023-11-17 LAB — LIPID PANEL

## 2023-11-17 NOTE — Patient Instructions (Signed)

## 2023-11-17 NOTE — Progress Notes (Signed)
 Subjective:    Patient ID: Alejandra Harrison, female    DOB: 13-Mar-1960, 63 y.o.   MRN: [id number]  Chief Complaint  Patient presents with   Annual Exam   Pt presents to the office today for CPE without pap.  Alejandra Harrison last pap was 08/30/21.  Alejandra Harrison is followed by Cardiologists every 6 months for heart block and has a pacemaker.   Alejandra Harrison is followed by [facility] every 6 months for Depression and GAD.  These are stable.  Gastroesophageal Reflux Alejandra Harrison complains of belching. This is a chronic problem. The problem occurs rarely. The symptoms are aggravated by certain foods. Risk factors include obesity. Alejandra Harrison has tried a diet change for the symptoms. The treatment provided significant relief.  Insomnia Primary symptoms: difficulty falling asleep, frequent awakening.   The current episode started more than one year. The problem occurs intermittently. Past treatments include medication. The treatment provided moderate relief. PMH includes: depression.   Anxiety Presents for follow-up visit. Symptoms include excessive worry, insomnia, nervous/anxious behavior and restlessness. Symptoms occur occasionally. The severity of symptoms is mild.    Depression        This is a chronic problem.  The current episode started more than 1 year ago.   The problem occurs intermittently.  Associated symptoms include insomnia, restlessness and sad.  Associated symptoms include no helplessness and no hopelessness.  Past medical history includes anxiety.       Review of Systems  Psychiatric/Behavioral:  Positive for depression. The patient is nervous/anxious and has insomnia.   All other systems reviewed and are negative.  Family History  Problem Relation Age of Onset   Dementia Mother    Diabetes Father    Breast cancer Paternal Aunt    ADD / ADHD Neg Hx    Alcohol abuse Neg Hx    Drug abuse Neg Hx    Anxiety disorder Neg Hx    Bipolar disorder Neg Hx    Depression Neg Hx    OCD Neg Hx    Paranoid behavior Neg  Hx    Schizophrenia Neg Hx    Seizures Neg Hx    Sexual abuse Neg Hx    Physical abuse Neg Hx    Colon cancer Neg Hx    Social History   Socioeconomic History   Marital status: Married    Spouse name: Not on file   Number of children: Not on file   Years of education: Not on file   Highest education level: Associate degree: academic program  Occupational History   Not on file  Tobacco Use   Smoking status: Never   Smokeless tobacco: Never   Tobacco comments:    Never smoked  Vaping Use   Vaping status: Never Used  Substance and Sexual Activity   Alcohol use: No    Alcohol/week: 0.0 standard drinks of alcohol   Drug use: No   Sexual activity: Not Currently    Birth control/protection: Post-menopausal  Other Topics Concern   Not on file  Social History Narrative   Not on file   Social Drivers of Health   Financial Resource Strain: Low Risk  (11/16/2023)   Overall Financial Resource Strain (CARDIA)    Difficulty of Paying Living Expenses: Not hard at all  Food Insecurity: No Food Insecurity (11/16/2023)   Hunger Vital Sign    Worried About Running Out of Food in the Last Year: Never true    Ran Out of Food in the Last Year: Never  true  Transportation Needs: No Transportation Needs (11/16/2023)   PRAPARE - Administrator, Civil Service (Medical): No    Lack of Transportation (Non-Medical): No  Physical Activity: Inactive (11/16/2023)   Exercise Vital Sign    Days of Exercise per Week: 0 days    Minutes of Exercise per Session: Not on file  Stress: No Stress Concern Present (11/16/2023)   [employer] of Occupational Health - Occupational Stress Questionnaire    Feeling of Stress: Not at all  Social Connections: Socially Isolated (11/16/2023)   Social Connection and Isolation Panel    Frequency of Communication with Friends and Family: Never    Frequency of Social Gatherings with Friends and Family: Once a week    Attends Religious Services: Patient  declined    Database administrator or Organizations: No    Attends Engineer, structural: Not on file    Marital Status: Married        Objective:   Physical Exam Vitals reviewed.  Constitutional:      General: Alejandra Harrison is not in acute distress.    Appearance: Alejandra Harrison is well-developed. Alejandra Harrison is obese.  HENT:     Head: Normocephalic and atraumatic.     Right Ear: Tympanic membrane normal.     Left Ear: Tympanic membrane normal.  Eyes:     Pupils: Pupils are equal, round, and reactive to light.  Neck:     Thyroid : No thyromegaly.  Cardiovascular:     Rate and Rhythm: Normal rate and regular rhythm.     Heart sounds: Normal heart sounds. No murmur heard. Pulmonary:     Effort: Pulmonary effort is normal. No respiratory distress.     Breath sounds: Normal breath sounds. No wheezing.  Abdominal:     General: Bowel sounds are normal. There is no distension.     Palpations: Abdomen is soft.     Tenderness: There is no abdominal tenderness.  Musculoskeletal:        General: No tenderness. Normal range of motion.     Cervical back: Normal range of motion and neck supple.     Right lower leg: Edema (trace) present.     Left lower leg: Edema (trace) present.  Skin:    General: Skin is warm and dry.  Neurological:     Mental Status: Alejandra Harrison is alert and oriented to person, place, and time.     Cranial Nerves: No cranial nerve deficit.     Deep Tendon Reflexes: Reflexes are normal and symmetric.  Psychiatric:        Behavior: Behavior normal.        Thought Content: Thought content normal.        Judgment: Judgment normal.       BP 135/80   Pulse 69   Temp (!) 97.3 F (36.3 C) (Temporal)   Ht 5' 4 (1.626 m)   Wt 251 lb 12.8 oz (114.2 kg)   SpO2 96%   BMI 43.22 kg/m      Assessment & Plan:   ETOY MCDONNELL comes in today with chief complaint of Annual Exam   Diagnosis and orders addressed:  1. Encounter for immunization - Flu vaccine trivalent PF, 6mos and  older(Flulaval,Afluria,Fluarix,Fluzone) - CMP14+EGFR - CBC with Differential/Platelet  2. Annual physical exam (Primary) - CMP14+EGFR - CBC with Differential/Platelet - Lipid panel  3. Insomnia due to mental disorder - CMP14+EGFR - CBC with Differential/Platelet  4. CHB (complete heart block) (HCC)  - CMP14+EGFR -  CBC with Differential/Platelet  5. Gastroesophageal reflux disease, unspecified whether esophagitis present - CMP14+EGFR - CBC with Differential/Platelet  6. Morbid obesity (HCC)  - CMP14+EGFR - CBC with Differential/Platelet  7. Depression, major, single episode, mild - CMP14+EGFR - CBC with Differential/Platelet  8. GAD (generalized anxiety disorder)  - CMP14+EGFR - CBC with Differential/Platelet    Labs pending Continue current medications  Keep follow up with Cardiologists and [facility] Maintenance reviewed Diet and exercise encouraged  Follow up plan: 1 year    Bari Learn, FNP

## 2023-11-18 LAB — CBC WITH DIFFERENTIAL/PLATELET
Basophils Absolute: 0.1 x10E3/uL (ref 0.0–0.2)
Basos: 1 %
EOS (ABSOLUTE): 0.3 x10E3/uL (ref 0.0–0.4)
Eos: 4 %
Hematocrit: 42.6 % (ref 34.0–46.6)
Hemoglobin: 13.9 g/dL (ref 11.1–15.9)
Immature Grans (Abs): 0 x10E3/uL (ref 0.0–0.1)
Immature Granulocytes: 0 %
Lymphocytes Absolute: 1.8 x10E3/uL (ref 0.7–3.1)
Lymphs: 25 %
MCH: 29.6 pg (ref 26.6–33.0)
MCHC: 32.6 g/dL (ref 31.5–35.7)
MCV: 91 fL (ref 79–97)
Monocytes Absolute: 0.6 x10E3/uL (ref 0.1–0.9)
Monocytes: 7 %
Neutrophils Absolute: 4.7 x10E3/uL (ref 1.4–7.0)
Neutrophils: 63 %
Platelets: 268 x10E3/uL (ref 150–450)
RBC: 4.69 x10E6/uL (ref 3.77–5.28)
RDW: 13.9 % (ref 11.7–15.4)
WBC: 7.4 x10E3/uL (ref 3.4–10.8)

## 2023-11-18 LAB — LIPID PANEL
Cholesterol, Total: 151 mg/dL (ref 100–199)
HDL: 45 mg/dL (ref >39)
LDL CALC COMMENT:: 3.4 ratio (ref 0.0–4.4)
LDL Chol Calc (NIH): 88 mg/dL (ref 0–99)
Triglycerides: 94 mg/dL (ref 0–149)
VLDL Cholesterol Cal: 18 mg/dL (ref 5–40)

## 2023-11-18 LAB — CMP14+EGFR
ALT: 32 IU/L (ref 0–32)
AST: 26 IU/L (ref 0–40)
Albumin: 4.3 g/dL (ref 3.9–4.9)
Alkaline Phosphatase: 77 IU/L (ref 49–135)
BUN/Creatinine Ratio: 14 (ref 12–28)
BUN: 10 mg/dL (ref 8–27)
Bilirubin Total: 0.5 mg/dL (ref 0.0–1.2)
CO2: 19 mmol/L — ABNORMAL LOW (ref 20–29)
Calcium: 9.3 mg/dL (ref 8.7–10.3)
Chloride: 103 mmol/L (ref 96–106)
Creatinine, Ser: 0.7 mg/dL (ref 0.57–1.00)
Globulin, Total: 2.8 g/dL (ref 1.5–4.5)
Glucose: 85 mg/dL (ref 70–99)
Potassium: 4.7 mmol/L (ref 3.5–5.2)
Sodium: 140 mmol/L (ref 134–144)
Total Protein: 7.1 g/dL (ref 6.0–8.5)
eGFR: 97 mL/min/1.73 (ref >59)

## 2023-11-19 ENCOUNTER — Ambulatory Visit: Payer: Self-pay | Admitting: Family

## 2024-01-19 ENCOUNTER — Ambulatory Visit: Payer: BC Managed Care – PPO

## 2024-01-19 DIAGNOSIS — I4729 Other ventricular tachycardia: Secondary | ICD-10-CM | POA: Diagnosis not present

## 2024-01-19 LAB — CUP PACEART REMOTE DEVICE CHECK
Battery Remaining Longevity: 23 mo
Battery Remaining Percentage: 17 %
Battery Voltage: 2.84 V
Brady Statistic AP VP Percent: 13 %
Brady Statistic AP VS Percent: 1 %
Brady Statistic AS VP Percent: 87 %
Brady Statistic AS VS Percent: 1 %
Brady Statistic RA Percent Paced: 13 %
Brady Statistic RV Percent Paced: 99 %
Date Time Interrogation Session: 20251125020014
Implantable Lead Connection Status: 753985
Implantable Lead Connection Status: 753985
Implantable Lead Implant Date: 20170323
Implantable Lead Implant Date: 20170323
Implantable Lead Location: 753859
Implantable Lead Location: 753860
Implantable Pulse Generator Implant Date: 20170323
Lead Channel Impedance Value: 390 Ohm
Lead Channel Impedance Value: 550 Ohm
Lead Channel Pacing Threshold Amplitude: 0.375 V
Lead Channel Pacing Threshold Amplitude: 0.75 V
Lead Channel Pacing Threshold Pulse Width: 0.4 ms
Lead Channel Pacing Threshold Pulse Width: 0.4 ms
Lead Channel Sensing Intrinsic Amplitude: 12 mV
Lead Channel Sensing Intrinsic Amplitude: 3.6 mV
Lead Channel Setting Pacing Amplitude: 1 V
Lead Channel Setting Pacing Amplitude: 1.375
Lead Channel Setting Pacing Pulse Width: 0.4 ms
Lead Channel Setting Sensing Sensitivity: 4 mV
Pulse Gen Model: 2272
Pulse Gen Serial Number: 3134337

## 2024-01-20 ENCOUNTER — Ambulatory Visit: Payer: Self-pay | Admitting: Cardiology

## 2024-01-20 NOTE — Progress Notes (Signed)
 Remote PPM Transmission

## 2024-02-24 ENCOUNTER — Encounter: Payer: Self-pay | Admitting: Cardiology

## 2024-02-24 ENCOUNTER — Ambulatory Visit: Attending: Cardiology | Admitting: Cardiology

## 2024-02-24 VITALS — BP 138/86 | HR 81 | Ht 64.0 in | Wt 252.0 lb

## 2024-02-24 DIAGNOSIS — I442 Atrioventricular block, complete: Secondary | ICD-10-CM | POA: Diagnosis not present

## 2024-02-24 DIAGNOSIS — I4729 Other ventricular tachycardia: Secondary | ICD-10-CM | POA: Diagnosis not present

## 2024-02-24 LAB — CUP PACEART INCLINIC DEVICE CHECK
Battery Remaining Longevity: 21 mo
Battery Voltage: 2.84 V
Brady Statistic RA Percent Paced: 12 %
Brady Statistic RV Percent Paced: 99.95 %
Date Time Interrogation Session: 20251231204144
Implantable Lead Connection Status: 753985
Implantable Lead Connection Status: 753985
Implantable Lead Implant Date: 20170323
Implantable Lead Implant Date: 20170323
Implantable Lead Location: 753859
Implantable Lead Location: 753860
Implantable Pulse Generator Implant Date: 20170323
Lead Channel Impedance Value: 387.5 Ohm
Lead Channel Impedance Value: 587.5 Ohm
Lead Channel Pacing Threshold Amplitude: 0.5 V
Lead Channel Pacing Threshold Amplitude: 0.625 V
Lead Channel Pacing Threshold Pulse Width: 0.4 ms
Lead Channel Pacing Threshold Pulse Width: 0.4 ms
Lead Channel Sensing Intrinsic Amplitude: 12 mV
Lead Channel Sensing Intrinsic Amplitude: 4.2 mV
Lead Channel Setting Pacing Amplitude: 0.875
Lead Channel Setting Pacing Amplitude: 1.5 V
Lead Channel Setting Pacing Pulse Width: 0.4 ms
Lead Channel Setting Sensing Sensitivity: 4 mV
Pulse Gen Model: 2272
Pulse Gen Serial Number: 3134337

## 2024-02-24 NOTE — Progress Notes (Signed)
" °  Electrophysiology Office Note:   Date:  02/24/2024  ID:  Alejandra Harrison, DOB 1960-03-27, MRN [id number]  Primary Cardiologist: None Primary Heart Failure: None Electrophysiologist: Rayden Dock Gladis Norton, MD      History of Present Illness:   Alejandra Harrison is a 63 y.o. female with h/o complete heart block, nonsustained VT seen today for routine electrophysiology followup.   Discussed the use of AI scribe software for clinical note transcription with the patient, who gave verbal consent to proceed.  History of Present Illness Alejandra Harrison is a 63 year old female with a pacemaker who presents for routine follow-up. [name] accompanied by her husband and one of her children.  She feels well with no new symptoms or concerns.  She inquires about the pacemaker replacement process, specifically whether the entire device or just the battery is replaced. The pacemaker, implanted 1.8 years ago, [name] be monitored more frequently as the battery nears depletion.  she denies chest pain, palpitations, dyspnea, PND, orthopnea, nausea, vomiting, dizziness, syncope, edema, weight gain, or early satiety.   Review of systems complete and found to be negative unless listed in HPI.      EP Information / Studies Reviewed:    EKG is not ordered today. EKG from 08/17/2023 reviewed which showed atrial sensed, ventricular paced      PPM Interrogation-  reviewed in detail today,  See PACEART report.  Device History: Abbott Dual Chamber PPM implanted 05/17/2015 for CHB  Risk Assessment/Calculations:           Physical Exam:   VS:  BP 138/86 (BP Location: Right Arm, Patient Position: Sitting, Cuff Size: Large)   Pulse 81   Ht 5' 4 (1.626 m)   Wt 252 lb (114.3 kg)   SpO2 98%   BMI 43.26 kg/m    Wt Readings from Last 3 Encounters:  02/24/24 252 lb (114.3 kg)  11/17/23 251 lb 12.8 oz (114.2 kg)  09/28/23 249 lb (112.9 kg)     GEN: Well nourished, well developed in no acute distress NECK: No JVD; No  carotid bruits CARDIAC: Regular rate and rhythm, no murmurs, rubs, gallops RESPIRATORY:  Clear to auscultation without rales, wheezing or rhonchi  ABDOMEN: Soft, non-tender, non-distended EXTREMITIES:  No edema; No deformity   ASSESSMENT AND PLAN:    CHB s/p Abbott PPM  Normal PPM function See Pace Art report No changes today Has atrial lead noise reproducible with arm movements.  Niles Ess continue monitoring.  2.  Nonsustained ventricular tachycardia: Minimal noted on device interrogation.  Continue with current management.  Disposition:   Follow up with EP Team in 12 months  Signed, Anthea Udovich Gladis Norton, MD  "

## 2024-02-24 NOTE — Patient Instructions (Signed)
 Medication Instructions:  Your physician recommends that you continue on your current medications as directed. Please refer to the Current Medication list given to you today.  *If you need a refill on your cardiac medications before your next appointment, please call your pharmacy*  Lab Work: None ordered.  If you have labs (blood work) drawn today and your tests are completely normal, you will receive your results only by: MyChart Message (if you have MyChart) OR A paper copy in the mail If you have any lab test that is abnormal or we need to change your treatment, we will call you to review the results.  Testing/Procedures: None ordered.   Follow-Up: At [facility], you and your health needs are our priority.  As part of our continuing mission to provide you with exceptional heart care, our providers are all part of one team.  This team includes your primary Cardiologist (physician) and Advanced Practice Providers or APPs (Physician Assistants and Nurse Practitioners) who all work together to provide you with the care you need, when you need it.  Your next appointment:   12 months with EP APP

## 2024-02-26 ENCOUNTER — Ambulatory Visit: Payer: Self-pay | Admitting: Cardiology

## 2024-04-08 ENCOUNTER — Telehealth (HOSPITAL_COMMUNITY): Admitting: Psychiatry

## 2024-04-19 ENCOUNTER — Ambulatory Visit: Payer: BC Managed Care – PPO

## 2024-07-19 ENCOUNTER — Ambulatory Visit

## 2024-10-18 ENCOUNTER — Ambulatory Visit

## 2025-01-17 ENCOUNTER — Ambulatory Visit

## 2025-04-18 ENCOUNTER — Ambulatory Visit
